# Patient Record
Sex: Female | Born: 1960 | Race: White | Hispanic: No | Smoking: Current some day smoker
Health system: Southern US, Community
[De-identification: ages and names within clinical notes are randomized; demographics above are authoritative.]

## PROBLEM LIST (undated history)

## (undated) DIAGNOSIS — G43909 Migraine, unspecified, not intractable, without status migrainosus: Secondary | ICD-10-CM

## (undated) DIAGNOSIS — M792 Neuralgia and neuritis, unspecified: Secondary | ICD-10-CM

## (undated) DIAGNOSIS — F419 Anxiety disorder, unspecified: Secondary | ICD-10-CM

## (undated) DIAGNOSIS — E039 Hypothyroidism, unspecified: Secondary | ICD-10-CM

## (undated) DIAGNOSIS — F329 Major depressive disorder, single episode, unspecified: Secondary | ICD-10-CM

## (undated) DIAGNOSIS — J449 Chronic obstructive pulmonary disease, unspecified: Secondary | ICD-10-CM

## (undated) DIAGNOSIS — M1990 Unspecified osteoarthritis, unspecified site: Secondary | ICD-10-CM

## (undated) DIAGNOSIS — R112 Nausea with vomiting, unspecified: Secondary | ICD-10-CM

## (undated) DIAGNOSIS — M4800 Spinal stenosis, site unspecified: Secondary | ICD-10-CM

## (undated) DIAGNOSIS — R0600 Dyspnea, unspecified: Secondary | ICD-10-CM

## (undated) DIAGNOSIS — Z87442 Personal history of urinary calculi: Secondary | ICD-10-CM

## (undated) DIAGNOSIS — J189 Pneumonia, unspecified organism: Secondary | ICD-10-CM

## (undated) DIAGNOSIS — E785 Hyperlipidemia, unspecified: Secondary | ICD-10-CM

## (undated) DIAGNOSIS — K5900 Constipation, unspecified: Secondary | ICD-10-CM

## (undated) DIAGNOSIS — F32A Depression, unspecified: Secondary | ICD-10-CM

## (undated) DIAGNOSIS — Z9289 Personal history of other medical treatment: Secondary | ICD-10-CM

## (undated) DIAGNOSIS — J45909 Unspecified asthma, uncomplicated: Secondary | ICD-10-CM

## (undated) DIAGNOSIS — Z9889 Other specified postprocedural states: Secondary | ICD-10-CM

## (undated) HISTORY — DX: Major depressive disorder, single episode, unspecified: F32.9

## (undated) HISTORY — PX: CHOLECYSTECTOMY: SHX55

## (undated) HISTORY — PX: APPENDECTOMY: SHX54

## (undated) HISTORY — DX: Depression, unspecified: F32.A

## (undated) HISTORY — PX: ABDOMINAL HYSTERECTOMY: SHX81

## (undated) HISTORY — DX: Migraine, unspecified, not intractable, without status migrainosus: G43.909

## (undated) HISTORY — DX: Hyperlipidemia, unspecified: E78.5

## (undated) HISTORY — PX: COLONOSCOPY: SHX174

---

## 1999-08-15 ENCOUNTER — Encounter: Admission: RE | Admit: 1999-08-15 | Discharge: 1999-11-13 | Payer: Self-pay | Admitting: Occupational Medicine

## 1999-10-05 ENCOUNTER — Other Ambulatory Visit: Admission: RE | Admit: 1999-10-05 | Discharge: 1999-10-05 | Payer: Self-pay | Admitting: Family Medicine

## 2001-07-04 LAB — HM COLONOSCOPY

## 2002-01-28 ENCOUNTER — Encounter: Admission: RE | Admit: 2002-01-28 | Discharge: 2002-01-28 | Payer: Self-pay | Admitting: Occupational Medicine

## 2002-01-28 ENCOUNTER — Encounter: Payer: Self-pay | Admitting: Occupational Medicine

## 2006-10-16 ENCOUNTER — Emergency Department (HOSPITAL_COMMUNITY): Admission: EM | Admit: 2006-10-16 | Discharge: 2006-10-16 | Payer: Self-pay | Admitting: Emergency Medicine

## 2007-04-17 ENCOUNTER — Emergency Department (HOSPITAL_COMMUNITY): Admission: EM | Admit: 2007-04-17 | Discharge: 2007-04-17 | Payer: Self-pay | Admitting: Emergency Medicine

## 2007-10-25 ENCOUNTER — Ambulatory Visit: Payer: Self-pay | Admitting: Specialist

## 2010-11-18 LAB — POCT I-STAT CREATININE
Creatinine, Ser: 1.4 — ABNORMAL HIGH
Operator id: 282201

## 2010-11-18 LAB — CBC
HCT: 42.5
Hemoglobin: 14.6
MCHC: 34.5
MCV: 94.4
Platelets: 276
RBC: 4.5
RDW: 14
WBC: 10.2

## 2010-11-18 LAB — POCT CARDIAC MARKERS
CKMB, poc: <1 — ABNORMAL LOW
CKMB, poc: <1 — ABNORMAL LOW
Myoglobin, poc: 280
Myoglobin, poc: 325
Operator id: 282201
Operator id: 288331
Troponin i, poc: <0.05

## 2010-11-18 LAB — DIFFERENTIAL
Basophils Absolute: 0.1
Basophils Relative: 1
Eosinophils Absolute: 0.3
Eosinophils Relative: 3
Lymphocytes Relative: 44
Lymphs Abs: 4.5 — ABNORMAL HIGH
Monocytes Absolute: 0.5
Monocytes Relative: 5
Neutro Abs: 4.8
Neutrophils Relative %: 47

## 2010-11-18 LAB — I-STAT 8, (EC8 V) (CONVERTED LAB)
Acid-base deficit: 1
Chloride: 108
Hemoglobin: 15.3 — ABNORMAL HIGH
Potassium: 3.6
Sodium: 137
TCO2: 23

## 2010-12-09 LAB — DIFFERENTIAL
Basophils Absolute: 0.1
Basophils Relative: 0
Eosinophils Absolute: 0.2
Monocytes Relative: 5
Neutrophils Relative %: 70

## 2010-12-09 LAB — I-STAT 8, (EC8 V) (CONVERTED LAB)
Acid-base deficit: 1
Bicarbonate: 24.7 — ABNORMAL HIGH
Chloride: 108
pCO2, Ven: 42.6 — ABNORMAL LOW
pH, Ven: 7.372 — ABNORMAL HIGH

## 2010-12-09 LAB — CBC
MCHC: 35.5
MCV: 91.7
Platelets: 298
RDW: 13.4

## 2010-12-09 LAB — POCT I-STAT CREATININE
Creatinine, Ser: 1.2
Operator id: 285841

## 2010-12-09 LAB — POCT CARDIAC MARKERS
Operator id: 285841
Troponin i, poc: <0.05

## 2011-04-03 ENCOUNTER — Emergency Department (HOSPITAL_BASED_OUTPATIENT_CLINIC_OR_DEPARTMENT_OTHER)
Admission: EM | Admit: 2011-04-03 | Discharge: 2011-04-03 | Disposition: A | Discharge status: Home / Self Care | Payer: Managed Care, Other (non HMO) | Attending: Emergency Medicine | Admitting: Emergency Medicine

## 2011-04-03 ENCOUNTER — Encounter (HOSPITAL_BASED_OUTPATIENT_CLINIC_OR_DEPARTMENT_OTHER): Payer: Self-pay | Admitting: Student

## 2011-04-03 ENCOUNTER — Emergency Department (INDEPENDENT_AMBULATORY_CARE_PROVIDER_SITE_OTHER): Payer: Managed Care, Other (non HMO)

## 2011-04-03 ENCOUNTER — Other Ambulatory Visit: Payer: Self-pay

## 2011-04-03 DIAGNOSIS — R079 Chest pain, unspecified: Secondary | ICD-10-CM

## 2011-04-03 DIAGNOSIS — M549 Dorsalgia, unspecified: Secondary | ICD-10-CM

## 2011-04-03 DIAGNOSIS — R42 Dizziness and giddiness: Secondary | ICD-10-CM

## 2011-04-03 DIAGNOSIS — R0602 Shortness of breath: Secondary | ICD-10-CM | POA: Insufficient documentation

## 2011-04-03 DIAGNOSIS — Z79899 Other long term (current) drug therapy: Secondary | ICD-10-CM | POA: Insufficient documentation

## 2011-04-03 DIAGNOSIS — F411 Generalized anxiety disorder: Secondary | ICD-10-CM | POA: Insufficient documentation

## 2011-04-03 DIAGNOSIS — E039 Hypothyroidism, unspecified: Secondary | ICD-10-CM | POA: Insufficient documentation

## 2011-04-03 DIAGNOSIS — J4489 Other specified chronic obstructive pulmonary disease: Secondary | ICD-10-CM | POA: Insufficient documentation

## 2011-04-03 DIAGNOSIS — M79609 Pain in unspecified limb: Secondary | ICD-10-CM | POA: Insufficient documentation

## 2011-04-03 DIAGNOSIS — I700 Atherosclerosis of aorta: Secondary | ICD-10-CM

## 2011-04-03 DIAGNOSIS — R55 Syncope and collapse: Secondary | ICD-10-CM

## 2011-04-03 DIAGNOSIS — R110 Nausea: Secondary | ICD-10-CM | POA: Insufficient documentation

## 2011-04-03 DIAGNOSIS — R059 Cough, unspecified: Secondary | ICD-10-CM | POA: Insufficient documentation

## 2011-04-03 DIAGNOSIS — R05 Cough: Secondary | ICD-10-CM | POA: Insufficient documentation

## 2011-04-03 DIAGNOSIS — I708 Atherosclerosis of other arteries: Secondary | ICD-10-CM

## 2011-04-03 DIAGNOSIS — J449 Chronic obstructive pulmonary disease, unspecified: Secondary | ICD-10-CM | POA: Insufficient documentation

## 2011-04-03 DIAGNOSIS — F17200 Nicotine dependence, unspecified, uncomplicated: Secondary | ICD-10-CM | POA: Insufficient documentation

## 2011-04-03 HISTORY — DX: Neuralgia and neuritis, unspecified: M79.2

## 2011-04-03 HISTORY — DX: Chronic obstructive pulmonary disease, unspecified: J44.9

## 2011-04-03 HISTORY — DX: Hypothyroidism, unspecified: E03.9

## 2011-04-03 HISTORY — DX: Anxiety disorder, unspecified: F41.9

## 2011-04-03 LAB — BASIC METABOLIC PANEL
CO2: 25 mEq/L (ref 19–32)
Chloride: 109 mEq/L (ref 96–112)
Sodium: 144 mEq/L (ref 135–145)

## 2011-04-03 LAB — DIFFERENTIAL
Basophils Absolute: 0 10*3/uL (ref 0.0–0.1)
Lymphocytes Relative: 35 % (ref 12–46)
Monocytes Absolute: 0.4 10*3/uL (ref 0.1–1.0)
Neutro Abs: 4.5 10*3/uL (ref 1.7–7.7)

## 2011-04-03 LAB — CARDIAC PANEL(CRET KIN+CKTOT+MB+TROPI)
Relative Index: 1 (ref 0.0–2.5)
Total CK: 179 U/L — ABNORMAL HIGH (ref 7–177)
Troponin I: <0.3 ng/mL (ref <0.30)

## 2011-04-03 LAB — CBC
HCT: 46.5 % — ABNORMAL HIGH (ref 36.0–46.0)
Platelets: 209 10*3/uL (ref 150–400)
RDW: 13.7 % (ref 11.5–15.5)
WBC: 8 10*3/uL (ref 4.0–10.5)

## 2011-04-03 LAB — LIPASE, BLOOD: Lipase: 31 U/L (ref 11–59)

## 2011-04-03 MED ORDER — MORPHINE SULFATE 4 MG/ML IJ SOLN
4.0000 mg | Freq: Once | INTRAMUSCULAR | Status: AC
  Administered 2011-04-03: 4 mg via INTRAVENOUS
  Filled 2011-04-03: qty 1

## 2011-04-03 MED ORDER — IOHEXOL 350 MG/ML SOLN
80.0000 mL | Freq: Once | INTRAVENOUS | Status: AC | PRN
  Administered 2011-04-03: 80 mL via INTRAVENOUS

## 2011-04-03 MED ORDER — DIPHENHYDRAMINE HCL 50 MG/ML IJ SOLN
INTRAMUSCULAR | Status: AC
  Administered 2011-04-03: 25 mg via INTRAVENOUS
  Filled 2011-04-03: qty 1

## 2011-04-03 MED ORDER — SODIUM CHLORIDE 0.9 % IV BOLUS (SEPSIS)
1000.0000 mL | Freq: Once | INTRAVENOUS | Status: AC
  Administered 2011-04-03: 1000 mL via INTRAVENOUS

## 2011-04-03 MED ORDER — DIPHENHYDRAMINE HCL 50 MG/ML IJ SOLN
25.0000 mg | Freq: Once | INTRAMUSCULAR | Status: AC
  Administered 2011-04-03: 25 mg via INTRAVENOUS

## 2011-04-03 MED ORDER — ONDANSETRON HCL 4 MG/2ML IJ SOLN
4.0000 mg | Freq: Once | INTRAMUSCULAR | Status: AC
  Administered 2011-04-03: 4 mg via INTRAVENOUS
  Filled 2011-04-03: qty 2

## 2011-04-03 NOTE — ED Notes (Signed)
Allergic reaction noted to morphine, md notified, orders received and carried

## 2011-04-03 NOTE — ED Notes (Signed)
Pt in via EMS with c/o near syncopal episode at work

## 2011-04-03 NOTE — ED Notes (Signed)
Pt in via EMS from with c/o sudden onset of extreme pain between shoulders radiating to right arm last several minutes with associated N and dizziness after getting oob this morning. Reports it resolved and went to work where near syncopal episode happened again. With same s/sx. Reports getting lightheaded and dizzy with near syncope onset. Per ems 12 lead ekg NSR, pt placed on monitors and EKG performed upon arrival. Denies V D CP LOC SOB

## 2011-04-03 NOTE — ED Provider Notes (Signed)
History     CSN: [id number]  Arrival date & time 04/03/11  1308   First MD Initiated Contact with Patient 04/03/11 0815      Chief Complaint  Patient presents with  . Near Syncope    (Consider location/radiation/quality/duration/timing/severity/associated sxs/prior treatment) HPI  Woke up this morning with pain btw scapula. Also with dizziness which she describes as room spinning. Pain was sharp in back 10/10 with radiation to right arm. C/O transient sharp pain anterior chest pain as well. C/O more shortness of breath than usual. States she was able to make it to work then suddenly acute onset of vertigo and worsening shoulder pain. +nausea,no vomiting. Felt chills and clammy at that time. Each episode lasts a few minutes. Also had one at night. No syncope. CO "chest congestion" and coughing x one month. Denies h/o VTE in self or family. No recent hosp/surg/immob. No h/o cancer. Denies exogenous hormone use, no leg pain or swelling. Denies numbness/tingling/weakness of extremities. No trauma.   ED Notes, ED Provider Notes from 04/03/11 0000 to 04/03/11 08:21:25       Liliane Shi, RN 04/03/2011 08:18      Pt in via EMS from with c/o sudden onset of extreme pain between shoulders radiating to right arm last several minutes with associated N and dizziness after getting oob this morning. Reports it resolved and went to work where near syncopal episode happened again. With same s/sx. Reports getting lightheaded and dizzy with near syncope onset. Per ems 12 lead ekg NSR, pt placed on monitors and EKG performed upon arrival. Denies V D CP LOC SOB         Liliane Shi, RN 04/03/2011 08:01      Pt in via EMS with c/o near syncopal episode at work     Past Medical History  Diagnosis Date  . COPD (chronic obstructive pulmonary disease)   . Anxiety   . Neuropathic pain   . Hypothyroidism     Past Surgical History  Procedure Date  . Abdominal hysterectomy     No family history  on file.  History  Substance Use Topics  . Smoking status: Current Everyday Smoker  . Smokeless tobacco: Not on file  . Alcohol Use: Yes    OB History    Grav Para Term Preterm Abortions TAB SAB Ect Mult Living                  Review of Systems  All other systems reviewed and are negative.   except as noted HPI   Allergies  Codeine  Home Medications   Current Outpatient Rx  Name Route Sig Dispense Refill  . ALBUTEROL SULFATE HFA 108 (90 BASE) MCG/ACT IN AERS Inhalation Inhale 2 puffs into the lungs every 6 (six) hours as needed. For wheezing    . ASPIRIN-SALICYLAMIDE-CAFFEINE 325-95-16 MG PO TABS Oral Take 1 packet by mouth 3 (three) times daily as needed. For headache    . DIAZEPAM 2 MG PO TABS Oral Take 2 mg by mouth every 6 (six) hours as needed. For anxiety    . GABAPENTIN 100 MG PO CAPS Oral Take 300 mg by mouth daily. Takes 100 mg in morning and 200 mg at bedtime    . IBUPROFEN 200 MG PO TABS Oral Take 400 mg by mouth every 6 (six) hours as needed. For joint pain    . LEVOTHYROXINE SODIUM 125 MCG PO TABS Oral Take 125 mcg by mouth daily.    Marland Kitchen  PHENYLEPHRINE-DM-GG-APAP 5-10-200-325 MG/10ML PO LIQD Oral Take 10 mLs by mouth daily as needed. For  Congestion and cough      BP 102/62  Pulse 64  Temp(Src) 97.6 F (36.4 C) (Oral)  Resp 20  Wt 133 lb (60.328 kg)  SpO2 94%  Physical Exam  Nursing note and vitals reviewed. Constitutional: She is oriented to person, place, and time. She appears well-developed.  HENT:  Head: Atraumatic.  Mouth/Throat: Oropharynx is clear and moist.  Eyes: Conjunctivae and EOM are normal. Pupils are equal, round, and reactive to light.  Neck: Normal range of motion. Neck supple.  Cardiovascular: Normal rate, regular rhythm, normal heart sounds and intact distal pulses.   Pulmonary/Chest: Effort normal and breath sounds normal. No respiratory distress. She has no wheezes. She has no rales.  Abdominal: Soft. She exhibits no distension.  There is no tenderness. There is no rebound and no guarding.  Musculoskeletal: Normal range of motion. She exhibits no edema and no tenderness.       No thoracic spine ttp  Neurological: She is alert and oriented to person, place, and time. No cranial nerve deficit. She exhibits normal muscle tone. Coordination normal.       Strength 5/5 all extremities No pronator drift No facial droop   Skin: Skin is warm and dry. No rash noted.  Psychiatric: She has a normal mood and affect.     Date: 04/03/2011  Rate: 64  Rhythm: normal sinus rhythm  QRS Axis: normal  Intervals: normal  ST/T Wave abnormalities: normal  Conduction Disutrbances:none  Narrative Interpretation:   Old EKG Reviewed: none available  ED Course  Procedures (including critical care time)  Labs Reviewed  CBC - Abnormal; Notable for the following:    Hemoglobin 16.0 (*)    HCT 46.5 (*)    All other components within normal limits  BASIC METABOLIC PANEL - Abnormal; Notable for the following:    GFR calc non Af Amer 85 (*)    All other components within normal limits  CARDIAC PANEL(CRET KIN+CKTOT+MB+TROPI) - Abnormal; Notable for the following:    Total CK 179 (*)    All other components within normal limits  DIFFERENTIAL  LIPASE, BLOOD  TROPONIN I   Dg Chest 2 View  04/03/2011  *RADIOLOGY REPORT*  Clinical Data: Chest pain, dizziness, syncope, nausea.  History of COPD.  CHEST - 2 VIEW  Comparison: 04/17/2007.  Findings: There are changes of COPD present with linear bibasilar scarring changes and hyperinflation.  There are no acute infiltrates and there are no edematous changes.  The heart and mediastinal structures have a normal appearance.  IMPRESSION: Changes of COPD.  No acute findings.  Original Report Authenticated By: Rolla Plate, M.D.   Ct Angio Chest W/cm &/or Wo Cm  04/03/2011  *RADIOLOGY REPORT*  Clinical Data:  Back pain, chest pain and syncope.  CT ANGIOGRAPHY CHEST, ABDOMEN AND PELVIS  Technique:   Multidetector CT imaging through the chest, abdomen and pelvis was performed using the standard protocol during bolus administration of intravenous contrast.  Multiplanar reconstructed images including MIPs were obtained and reviewed to evaluate the vascular anatomy.  Contrast: 80mL OMNIPAQUE IOHEXOL 350 MG/ML IV SOLN,  Comparison:  None  CTA CHEST  Findings:  The chest wall is unremarkable.  No obvious breast masses, supraclavicular or axillary lymphadenopathy.  Small scattered lymph nodes are noted.  The bony thorax is intact.  The heart is normal in size.  No pericardial effusion.  No mediastinal or hilar lymphadenopathy.  Small scattered nodes are noted.  The esophagus is grossly normal.  The thoracic aorta is normal in caliber.  No dissection.  The pulmonary arterial tree is well opacified.  No filling defects to suggest pulmonary emboli.  Examination of the lung parenchyma demonstrates significant emphysematous changes.  No acute pulmonary findings.  No pleural effusion or pulmonary edema.   Review of the MIP images confirms the above findings.  IMPRESSION:  1.  No CT findings for aortic dissection. 2.  Emphysematous changes but no acute pulmonary findings.  CTA ABDOMEN AND PELVIS  Findings:  The abdominal aorta is normal in caliber.  No dissection.  The branch vessels are normal.  Minimal atherosclerotic calcifications in the distal aorta.  The iliac arteries are normal in caliber.  Minimal scattered atherosclerotic calcifications.  No dissection.  No occlusion.  The solid abdominal organs are unremarkable.  No mass lesions.  The gallbladder is normal.  No common bile duct dilatation.  The stomach, duodenum, small bowel and colon are grossly normal without oral contrast.  The bladder is normal.  The uterus is surgically absent.  No pelvic mass, adenopathy or free pelvic fluid collections.  No inguinal mass or hernia.  The bony structures are unremarkable.   Review of the MIP images confirms the above  findings.  IMPRESSION:  1.  Minimal atherosclerotic calcifications involving the aorta and iliac arteries but no aneurysm or dissection. 2.  No acute abdominal/pelvic findings, mass lesions or adenopathy.  Original Report Authenticated By: P. Loralie Champagne, M.D.   Ct Angio Abd/pel W/ And/or W/o  04/03/2011  *RADIOLOGY REPORT*  Clinical Data:  Back pain, chest pain and syncope.  CT ANGIOGRAPHY CHEST, ABDOMEN AND PELVIS  Technique:  Multidetector CT imaging through the chest, abdomen and pelvis was performed using the standard protocol during bolus administration of intravenous contrast.  Multiplanar reconstructed images including MIPs were obtained and reviewed to evaluate the vascular anatomy.  Contrast: 80mL OMNIPAQUE IOHEXOL 350 MG/ML IV SOLN,  Comparison:  None  CTA CHEST  Findings:  The chest wall is unremarkable.  No obvious breast masses, supraclavicular or axillary lymphadenopathy.  Small scattered lymph nodes are noted.  The bony thorax is intact.  The heart is normal in size.  No pericardial effusion.  No mediastinal or hilar lymphadenopathy.  Small scattered nodes are noted.  The esophagus is grossly normal.  The thoracic aorta is normal in caliber.  No dissection.  The pulmonary arterial tree is well opacified.  No filling defects to suggest pulmonary emboli.  Examination of the lung parenchyma demonstrates significant emphysematous changes.  No acute pulmonary findings.  No pleural effusion or pulmonary edema.   Review of the MIP images confirms the above findings.  IMPRESSION:  1.  No CT findings for aortic dissection. 2.  Emphysematous changes but no acute pulmonary findings.  CTA ABDOMEN AND PELVIS  Findings:  The abdominal aorta is normal in caliber.  No dissection.  The branch vessels are normal.  Minimal atherosclerotic calcifications in the distal aorta.  The iliac arteries are normal in caliber.  Minimal scattered atherosclerotic calcifications.  No dissection.  No occlusion.  The solid  abdominal organs are unremarkable.  No mass lesions.  The gallbladder is normal.  No common bile duct dilatation.  The stomach, duodenum, small bowel and colon are grossly normal without oral contrast.  The bladder is normal.  The uterus is surgically absent.  No pelvic mass, adenopathy or free pelvic fluid collections.  No inguinal mass or hernia.  The bony structures are unremarkable.   Review of the MIP images confirms the above findings.  IMPRESSION:  1.  Minimal atherosclerotic calcifications involving the aorta and iliac arteries but no aneurysm or dissection. 2.  No acute abdominal/pelvic findings, mass lesions or adenopathy.  Original Report Authenticated By: P. Loralie Champagne, M.D.     1. Back pain   2. Chest pain   3. Near syncope      MDM  Presents with chest/back pain, dizziness, near syncope.  CXR eval ptx, CTA chest eval dissection. Low susp PE, ACS. I do not suspect acute thoracic spine etiology. IVF, pain control, zofran. Reassess.  4098 Patient c/o itching at site of morphine admin. Benadryl ordered.  1200. Patient asx at this time. Labs and CTA revealed and unremarkable. Repeat CE 1P. Patient and family updated on plan.  2:18 PM Second tropnin neg. Patient continues to remain asx. If ambulatory and asx, will discharge home with PMD f/u.  PMD [facility]  Forbes Cellar, MD 04/03/11 [phone]

## 2014-12-01 LAB — HM COLONOSCOPY

## 2015-12-07 ENCOUNTER — Ambulatory Visit (INDEPENDENT_AMBULATORY_CARE_PROVIDER_SITE_OTHER): Payer: Managed Care, Other (non HMO) | Admitting: Family Medicine

## 2015-12-07 ENCOUNTER — Encounter: Payer: Self-pay | Admitting: Family Medicine

## 2015-12-07 VITALS — BP 126/86 | HR 85 | Temp 98.4°F | Ht 64.5 in | Wt 124.0 lb

## 2015-12-07 DIAGNOSIS — J441 Chronic obstructive pulmonary disease with (acute) exacerbation: Secondary | ICD-10-CM | POA: Diagnosis not present

## 2015-12-07 DIAGNOSIS — R252 Cramp and spasm: Secondary | ICD-10-CM

## 2015-12-07 DIAGNOSIS — J449 Chronic obstructive pulmonary disease, unspecified: Secondary | ICD-10-CM | POA: Insufficient documentation

## 2015-12-07 DIAGNOSIS — J410 Simple chronic bronchitis: Secondary | ICD-10-CM

## 2015-12-07 DIAGNOSIS — E038 Other specified hypothyroidism: Secondary | ICD-10-CM | POA: Diagnosis not present

## 2015-12-07 DIAGNOSIS — F3289 Other specified depressive episodes: Secondary | ICD-10-CM

## 2015-12-07 DIAGNOSIS — Z23 Encounter for immunization: Secondary | ICD-10-CM | POA: Diagnosis not present

## 2015-12-07 DIAGNOSIS — D508 Other iron deficiency anemias: Secondary | ICD-10-CM

## 2015-12-07 DIAGNOSIS — F419 Anxiety disorder, unspecified: Secondary | ICD-10-CM | POA: Insufficient documentation

## 2015-12-07 DIAGNOSIS — D649 Anemia, unspecified: Secondary | ICD-10-CM | POA: Insufficient documentation

## 2015-12-07 DIAGNOSIS — R062 Wheezing: Secondary | ICD-10-CM

## 2015-12-07 DIAGNOSIS — F339 Major depressive disorder, recurrent, unspecified: Secondary | ICD-10-CM | POA: Insufficient documentation

## 2015-12-07 DIAGNOSIS — R5383 Other fatigue: Secondary | ICD-10-CM | POA: Insufficient documentation

## 2015-12-07 DIAGNOSIS — G43909 Migraine, unspecified, not intractable, without status migrainosus: Secondary | ICD-10-CM | POA: Insufficient documentation

## 2015-12-07 DIAGNOSIS — E039 Hypothyroidism, unspecified: Secondary | ICD-10-CM | POA: Insufficient documentation

## 2015-12-07 DIAGNOSIS — G43001 Migraine without aura, not intractable, with status migrainosus: Secondary | ICD-10-CM

## 2015-12-07 DIAGNOSIS — F329 Major depressive disorder, single episode, unspecified: Secondary | ICD-10-CM | POA: Insufficient documentation

## 2015-12-07 MED ORDER — ALBUTEROL SULFATE HFA 108 (90 BASE) MCG/ACT IN AERS: 2.0000 | INHALATION_SPRAY | Freq: Four times a day (QID) | RESPIRATORY_TRACT | 1 refills | Status: DC | PRN

## 2015-12-07 MED ORDER — PREDNISONE 20 MG PO TABS: ORAL_TABLET | ORAL | 0 refills | Status: DC

## 2015-12-07 MED ORDER — AMOXICILLIN-POT CLAVULANATE 875-125 MG PO TABS: 1.0000 | ORAL_TABLET | Freq: Two times a day (BID) | ORAL | 0 refills | Status: AC

## 2015-12-07 MED ORDER — HYDROCOD POLST-CPM POLST ER 10-8 MG/5ML PO SUER
5.0000 mL | Freq: Two times a day (BID) | ORAL | 0 refills | Status: DC | PRN
Start: 2015-12-07 — End: 2016-01-03

## 2015-12-07 NOTE — Progress Notes (Signed)
New patient office visit note:  Impression and Recommendations:    1. COPD exacerbation ([place])   2. Need for prophylactic vaccination and inoculation against influenza   3. Migraine without aura and with status migrainosus, not intractable   4. Other specified hypothyroidism   5. Simple chronic bronchitis ([place])   6. Anxiety   7. Other depression   8. Other fatigue   9. Other iron deficiency anemia   10. Wheeze   11. Bilateral leg cramps    - Counseled patient on pathophysiology of disease and discussed various treatment options, which often includes dietary and lifestyle modifications as first line, in addition to discussing the risks and benefits of various medications.  Antibiotics, cough medicines, prednisone given.  - Anticipatory guidance given.   - Encouraged to return to clinic or call the office with any further questions or concerns.  - Fasting labs for near future to get baseline then follow-up office visit to discuss these things.   Declines any need for further refills.  Orders Placed This Encounter  Procedures  . COMPLETE METABOLIC PANEL WITH GFR  . CBC with Differential/Platelet  . Hemoglobin A1c  . Hepatitis C antibody  . HIV antibody  . Lipid panel  . Magnesium  . Phosphorus  . T4, free  . TSH  . VITAMIN D 25 Hydroxy (Vit-D Deficiency, Fractures)  . Vitamin B12    New Prescriptions   AMOXICILLIN-CLAVULANATE (AUGMENTIN) 875-125 MG TABLET    Take 1 tablet by mouth 2 (two) times daily.   CHLORPHENIRAMINE-HYDROCODONE (TUSSIONEX) 10-8 MG/5ML SUER    Take 5 mLs by mouth every 12 (twelve) hours as needed for cough (cough, will cause drowsiness.).   PREDNISONE (DELTASONE) 20 MG TABLET    Take 3 pills a day for 2 days, 2 pills a day for 2 days, 1 pill a day for 2 days then one half pill a day for 2 days then off    Modified Medications   Modified Medication Previous Medication   ALBUTEROL (PROVENTIL HFA;VENTOLIN HFA) 108 (90 BASE) MCG/ACT INHALER  albuterol (PROVENTIL HFA;VENTOLIN HFA) 108 (90 BASE) MCG/ACT inhaler      Inhale 2 puffs into the lungs every 6 (six) hours as needed. For wheezing    Inhale 2 puffs into the lungs every 6 (six) hours as needed. For wheezing    Discontinued Medications   DIAZEPAM (VALIUM) 2 MG TABLET    Take 2 mg by mouth every 6 (six) hours as needed. For anxiety   GABAPENTIN (NEURONTIN) 100 MG CAPSULE    Take 300 mg by mouth daily. Takes 100 mg in morning and 200 mg at bedtime   LEVOTHYROXINE (SYNTHROID, LEVOTHROID) 125 MCG TABLET    Take 125 mcg by mouth daily.   PHENYLEPHRINE-DM-GG-APAP (MUCINEX CHILD MULTI-SYMPTOM) 5-10-200-325 MG/10ML LIQD    Take 10 mLs by mouth daily as needed. For  Congestion and cough    Return for FBW near future and then OV with me 1 wk later to discuss.  The patient was counseled, risk factors were discussed, anticipatory guidance given.  Gross side effects, risk and benefits, and alternatives of medications discussed with patient.  Patient is aware that all medications have potential side effects and we are unable to predict every side effect or drug-drug interaction that may occur.  Expresses verbal understanding and consents to current therapy plan and treatment regimen.  Please see AVS handed out to patient at the end of our visit for further patient  instructions/ counseling done pertaining to today's office visit.    Note: This document was prepared using Dragon voice recognition software and may include unintentional dictation errors.  ----------------------------------------------------------------------------------------------------------------------    Subjective:    Chief Complaint  Patient presents with  . Establish Care    HPI: Kellie Jones is a pleasant 55 y.o. female who presents to [place] [facility] today to review their medical history with me and establish care.   I asked the patient to review their chronic problem list with me  to ensure everything was updated and accurate.     Feet cramps- random times during the day, 15 yrs. + claudication Symptoms  . In legs = 5-10 yrs and feet.   -  43yrs smoking- 1 or less PPD.  Still smoking. Tried quit many times- chantix- bad s-e, patches- didn't work and caused rashes.    - History of depression on Zoloft, stable  - History of migraines, uses Imitrex when necessary.  CC: Prod cough, nasal  congestion, RN- clear, F/C--> 101.1 , Hard to breath- wheezing - tightness in chest- 3 wks now and rece random sugar here as a new patient so ntly Went to [employer] [facility]- got a steroid shot and that's all.   Patient Care Team    Relationship Specialty Notifications Start End  Mellody Dance, DO PCP - General Family Medicine  12/07/15   Gardiner Rhyme, MD Referring Physician Specialist  12/08/15     Wt Readings from Last 3 Encounters:  12/07/15 124 lb (56.2 kg)  04/03/11 133 lb (60.3 kg)   BP Readings from Last 3 Encounters:  12/07/15 126/86  04/03/11 102/62   Pulse Readings from Last 3 Encounters:  12/07/15 85  04/03/11 64   BMI Readings from Last 3 Encounters:  12/07/15 20.96 kg/m   No results found for: HGBA1C  Patient Active Problem List   Diagnosis Date Noted  . Bilateral leg cramps 12/11/2015  . Migraine 12/07/2015  . Hypothyroidism 12/07/2015  . COPD (chronic obstructive pulmonary disease) ([place]) 12/07/2015  . Anxiety 12/07/2015  . Depression 12/07/2015  . Other fatigue 12/07/2015  . Absolute anemia 12/07/2015     Past Medical History:  Diagnosis Date  . Anxiety   . COPD (chronic obstructive pulmonary disease) ([place])   . Depression   . Hypothyroidism   . Migraine   . Neuropathic pain      Past Surgical History:  Procedure Laterality Date  . ABDOMINAL HYSTERECTOMY     total  . APPENDECTOMY    . CHOLECYSTECTOMY       Family History  Problem Relation Age of Onset  . Depression Mother   . Cancer Father 46    pancreatic  . Healthy  Sister   . Healthy Brother   . Cancer Maternal Aunt 56    breast  . Cancer Maternal Grandmother 39    breast  . Healthy Brother   . Healthy Brother      History  Drug Use No    History  Alcohol Use  . Yes    Comment: rarely    History  Smoking Status  . Current Every Day Smoker  . Packs/day: 0.50  . Years: 27.00  Smokeless Tobacco  . Never Used    Patient's Medications  New Prescriptions   AMOXICILLIN-CLAVULANATE (AUGMENTIN) 875-125 MG TABLET    Take 1 tablet by mouth 2 (two) times daily.   CHLORPHENIRAMINE-HYDROCODONE (TUSSIONEX) 10-8 MG/5ML SUER    Take 5 mLs  by mouth every 12 (twelve) hours as needed for cough (cough, will cause drowsiness.).   PREDNISONE (DELTASONE) 20 MG TABLET    Take 3 pills a day for 2 days, 2 pills a day for 2 days, 1 pill a day for 2 days then one half pill a day for 2 days then off  Previous Medications   ASPIRIN-SALICYLAMIDE-CAFFEINE (BC HEADACHE) 325-95-16 MG TABS    Take 1 packet by mouth 3 (three) times daily as needed. For headache   IBUPROFEN (ADVIL,MOTRIN) 200 MG TABLET    Take 400 mg by mouth every 6 (six) hours as needed. For joint pain   LEVOTHYROXINE (SYNTHROID, LEVOTHROID) 150 MCG TABLET    Take 150 mcg by mouth daily before breakfast.   METAXALONE (SKELAXIN) 800 MG TABLET    Take 800 mg by mouth. One tablet 3-4 times daily as needed   SERTRALINE (ZOLOFT) 100 MG TABLET    Take 100 mg by mouth at bedtime.   SUMATRIPTAN (IMITREX) 100 MG TABLET    Take 100 mg by mouth as needed.  Modified Medications   Modified Medication Previous Medication   ALBUTEROL (PROVENTIL HFA;VENTOLIN HFA) 108 (90 BASE) MCG/ACT INHALER albuterol (PROVENTIL HFA;VENTOLIN HFA) 108 (90 BASE) MCG/ACT inhaler      Inhale 2 puffs into the lungs every 6 (six) hours as needed. For wheezing    Inhale 2 puffs into the lungs every 6 (six) hours as needed. For wheezing  Discontinued Medications   DIAZEPAM (VALIUM) 2 MG TABLET    Take 2 mg by mouth every 6 (six) hours as  needed. For anxiety   GABAPENTIN (NEURONTIN) 100 MG CAPSULE    Take 300 mg by mouth daily. Takes 100 mg in morning and 200 mg at bedtime   LEVOTHYROXINE (SYNTHROID, LEVOTHROID) 125 MCG TABLET    Take 125 mcg by mouth daily.   PHENYLEPHRINE-DM-GG-APAP (MUCINEX CHILD MULTI-SYMPTOM) 5-10-200-325 MG/10ML LIQD    Take 10 mLs by mouth daily as needed. For  Congestion and cough    Allergies: Iodinated diagnostic agents and Codeine  Review of Systems  Constitutional: Positive for diaphoresis and fever. Negative for chills, malaise/fatigue and weight loss.  HENT: Positive for congestion. Negative for sore throat and tinnitus.   Eyes: Negative.  Negative for blurred vision, double vision and photophobia.  Respiratory: Positive for cough, sputum production, shortness of breath and wheezing.   Cardiovascular: Positive for palpitations. Negative for chest pain.  Gastrointestinal: Positive for abdominal pain, diarrhea, heartburn and nausea. Negative for blood in stool and vomiting.  Genitourinary: Negative.  Negative for dysuria, frequency and urgency.  Musculoskeletal: Positive for back pain, joint pain and neck pain. Negative for myalgias.  Skin: Negative.  Negative for itching and rash.  Neurological: Positive for tremors and weakness. Negative for dizziness, focal weakness and headaches.  Endo/Heme/Allergies: Positive for environmental allergies. Negative for polydipsia. Bruises/bleeds easily.  Psychiatric/Behavioral: Negative.  Negative for depression and memory loss. The patient is not nervous/anxious and does not have insomnia.      Objective:   Blood pressure 126/86, pulse 85, temperature 98.4 F (36.9 C), temperature source Oral, height 5' 4.5" (1.638 m), weight 124 lb (56.2 kg). Body mass index is 20.96 kg/m. General: Well Developed, well nourished, and in no acute distress.  Neuro: Alert and oriented x3, extra-ocular muscles intact, sensation grossly intact.  HEENT: Normocephalic,  atraumatic, pupils equal round reactive to light, neck supple, OP- mild erythema. Nares-erythematous and edematous, TMs-within normal limits bilaterally, lymphadenopathy present and nontender. Skin: no gross  suspicious lesions or rashes; No skin tenting.  Cardiac: Regular rate and rhythm, no murmurs rubs or gallops.  Respiratory: Wh and rhonchi b/l- diffuse. Not using accessory muscles, speaking in full sentences.  Abdominal: Soft, not grossly distended Musculoskeletal: Ambulates w/o diff, FROM * 4 ext. tenderness to palpation of gastrocnemius muscles.  FROM knee/ ankles Vasc: less 2 sec cap RF, warm and pink  Psych:  No HI/SI, judgement and insight good, Euthymic mood. Full Affect.

## 2015-12-07 NOTE — Patient Instructions (Signed)
Intermittent Claudication Intermittent claudication is pain in your leg that occurs when you walk or exercise and goes away when you rest. The pain can occur in one or both legs. CAUSES Intermittent claudication is caused by the buildup of plaque within the major arteries in the body (atherosclerosis). The plaque, which makes arteries stiff and narrow, prevents enough blood from reaching your leg muscles. The pain occurs when you walk or exercise because your muscles need more blood when you are moving and exercising. RISK FACTORS Risk factors include:  A family history of atherosclerosis.  A personal history of stroke or heart disease.  Older age.  Being inactive or overweight.  Smoking cigarettes.  Having another health condition such as:  Diabetes.  High blood pressure.  High cholesterol. SIGNS AND SYMPTOMS  Your hip or leg may:   Ache.  Cramp.  Feel tight.  Feel weak.  Feel heavy. Over time, you may feel pain in your calf, thigh, or hip. DIAGNOSIS  Your health care provider may diagnose intermittent claudication based on your symptoms and medical history. Your health care provider may also do tests to learn more about your condition. These may include:  Blood tests.  An ultrasound.  Imaging tests such as angiography, magnetic resonance angiography (MRA), and computed tomography angiography (CTA). TREATMENT You may be treated for problems such as:  High blood pressure.  High cholesterol.  Diabetes. Other treatments may include:  Lifestyle changes such as:  Starting an exercise program.  Losing weight.  Quitting smoking.  Medicines to help restore blood flow through your legs.  Blood vessel surgery (angioplasty) to restore blood flow if your intermittent claudication is caused by severe peripheral artery disease. HOME CARE INSTRUCTIONS  Manage any other health conditions you have.  Eat a diet low in saturated fats and calories to maintain a  healthy weight.  Quit smoking, if you smoke.  Take medicines only as directed by your health care provider.  If your health care provider recommended an exercise program for you, follow it as directed. Your exercise program may involve:  Walking three or more times a week.  Walking until you have certain symptoms of intermittent claudication.  Resting until symptoms go away.  Gradually increasing walking time to about 50 minutes a day. SEEK MEDICAL CARE IF: Your condition is not getting better or is getting worse. SEEK IMMEDIATE MEDICAL CARE IF:   You have chest pain.  You have difficulty breathing.  You develop arm weakness.  You have trouble speaking.  Your face begins to droop. MAKE SURE YOU:  Understand these instructions.  Will watch your condition.  Will get help if you are not doing well or get worse.   This information is not intended to replace advice given to you by your health care provider. Make sure you discuss any questions you have with your health care provider.   Document Released: 12/17/2003 Document Revised: 03/06/2014 Document Reviewed: 05/22/2013 Elsevier Interactive Patient Education 2016 Elsevier Inc.  

## 2015-12-11 DIAGNOSIS — R252 Cramp and spasm: Secondary | ICD-10-CM | POA: Insufficient documentation

## 2015-12-15 ENCOUNTER — Encounter: Payer: Self-pay | Admitting: Family Medicine

## 2015-12-15 ENCOUNTER — Other Ambulatory Visit (INDEPENDENT_AMBULATORY_CARE_PROVIDER_SITE_OTHER): Payer: Managed Care, Other (non HMO)

## 2015-12-15 DIAGNOSIS — Z23 Encounter for immunization: Secondary | ICD-10-CM | POA: Diagnosis not present

## 2015-12-15 DIAGNOSIS — F419 Anxiety disorder, unspecified: Secondary | ICD-10-CM

## 2015-12-15 DIAGNOSIS — E038 Other specified hypothyroidism: Secondary | ICD-10-CM

## 2015-12-15 DIAGNOSIS — D508 Other iron deficiency anemias: Secondary | ICD-10-CM

## 2015-12-15 DIAGNOSIS — G43001 Migraine without aura, not intractable, with status migrainosus: Secondary | ICD-10-CM

## 2015-12-15 DIAGNOSIS — F3289 Other specified depressive episodes: Secondary | ICD-10-CM

## 2015-12-15 DIAGNOSIS — J410 Simple chronic bronchitis: Secondary | ICD-10-CM

## 2015-12-15 DIAGNOSIS — R5383 Other fatigue: Secondary | ICD-10-CM

## 2015-12-16 LAB — CBC WITH DIFFERENTIAL/PLATELET
BASOS ABS: 96 {cells}/uL (ref 0–200)
Basophils Relative: 1 %
EOS ABS: 288 {cells}/uL (ref 15–500)
EOS PCT: 3 %
HEMATOCRIT: 49.1 % — AB (ref 35.0–45.0)
HEMOGLOBIN: 16.4 g/dL — AB (ref 11.7–15.5)
LYMPHS ABS: 3072 {cells}/uL (ref 850–3900)
Lymphocytes Relative: 32 %
MCH: 32 pg (ref 27.0–33.0)
MCHC: 33.4 g/dL (ref 32.0–36.0)
MCV: 95.9 fL (ref 80.0–100.0)
MPV: 9.1 fL (ref 7.5–12.5)
Monocytes Absolute: 864 cells/uL (ref 200–950)
Monocytes Relative: 9 %
NEUTROS ABS: 5280 {cells}/uL (ref 1500–7800)
NEUTROS PCT: 55 %
Platelets: 311 10*3/uL (ref 140–400)
RBC: 5.12 MIL/uL — ABNORMAL HIGH (ref 3.80–5.10)
RDW: 13.7 % (ref 11.0–15.0)
WBC: 9.6 10*3/uL (ref 3.8–10.8)

## 2015-12-16 LAB — COMPLETE METABOLIC PANEL WITH GFR
ALBUMIN: 4.4 g/dL (ref 3.6–5.1)
ALK PHOS: 59 U/L (ref 33–130)
ALT: 12 U/L (ref 6–29)
AST: 14 U/L (ref 10–35)
BUN: 12 mg/dL (ref 7–25)
CO2: 23 mmol/L (ref 20–31)
Calcium: 9.3 mg/dL (ref 8.6–10.4)
Chloride: 107 mmol/L (ref 98–110)
Creat: 0.74 mg/dL (ref 0.50–1.05)
GFR, Est African American: >89 mL/min (ref >=60)
GLUCOSE: 94 mg/dL (ref 65–99)
POTASSIUM: 4.6 mmol/L (ref 3.5–5.3)
SODIUM: 140 mmol/L (ref 135–146)
TOTAL PROTEIN: 6.6 g/dL (ref 6.1–8.1)
Total Bilirubin: 0.3 mg/dL (ref 0.2–1.2)

## 2015-12-16 LAB — HIV ANTIBODY (ROUTINE TESTING W REFLEX): HIV: NONREACTIVE

## 2015-12-16 LAB — HEMOGLOBIN A1C
Hgb A1c MFr Bld: 5 % (ref <5.7)
Mean Plasma Glucose: 97 mg/dL

## 2015-12-16 LAB — TSH: TSH: 1.3 mIU/L

## 2015-12-16 LAB — LIPID PANEL
CHOL/HDL RATIO: 2.8 ratio (ref <=5.0)
CHOLESTEROL: 181 mg/dL (ref 125–200)
HDL: 64 mg/dL (ref >=46)
LDL Cholesterol: 103 mg/dL (ref <130)
TRIGLYCERIDES: 72 mg/dL (ref <150)
VLDL: 14 mg/dL (ref <30)

## 2015-12-16 LAB — HEPATITIS C ANTIBODY: HCV Ab: NEGATIVE

## 2015-12-16 LAB — T4, FREE: Free T4: 1.2 ng/dL (ref 0.8–1.8)

## 2015-12-16 LAB — MAGNESIUM: Magnesium: 2 mg/dL (ref 1.5–2.5)

## 2015-12-16 LAB — PHOSPHORUS: PHOSPHORUS: 3 mg/dL (ref 2.5–4.5)

## 2015-12-16 LAB — VITAMIN B12: VITAMIN B 12: 353 pg/mL (ref 200–1100)

## 2015-12-16 LAB — VITAMIN D 25 HYDROXY (VIT D DEFICIENCY, FRACTURES): VIT D 25 HYDROXY: 14 ng/mL — AB (ref 30–100)

## 2015-12-21 ENCOUNTER — Other Ambulatory Visit: Payer: Self-pay

## 2015-12-21 MED ORDER — LEVOTHYROXINE SODIUM 150 MCG PO TABS: 150.0000 ug | ORAL_TABLET | Freq: Every day | ORAL | 0 refills | Status: DC

## 2015-12-21 MED ORDER — VITAMIN D (ERGOCALCIFEROL) 1.25 MG (50000 UNIT) PO CAPS: 50000.0000 [IU] | ORAL_CAPSULE | ORAL | 3 refills | Status: DC

## 2015-12-30 ENCOUNTER — Other Ambulatory Visit: Payer: Self-pay

## 2015-12-30 NOTE — Progress Notes (Signed)
ERROR

## 2016-01-03 ENCOUNTER — Encounter: Payer: Self-pay | Admitting: Family Medicine

## 2016-01-03 ENCOUNTER — Ambulatory Visit (INDEPENDENT_AMBULATORY_CARE_PROVIDER_SITE_OTHER): Payer: Managed Care, Other (non HMO) | Admitting: Family Medicine

## 2016-01-03 VITALS — BP 125/88 | HR 87 | Wt 124.2 lb

## 2016-01-03 DIAGNOSIS — J439 Emphysema, unspecified: Secondary | ICD-10-CM

## 2016-01-03 DIAGNOSIS — J3089 Other allergic rhinitis: Secondary | ICD-10-CM | POA: Insufficient documentation

## 2016-01-03 DIAGNOSIS — Z8719 Personal history of other diseases of the digestive system: Secondary | ICD-10-CM

## 2016-01-03 DIAGNOSIS — F17200 Nicotine dependence, unspecified, uncomplicated: Secondary | ICD-10-CM

## 2016-01-03 DIAGNOSIS — Z716 Tobacco abuse counseling: Secondary | ICD-10-CM

## 2016-01-03 DIAGNOSIS — E039 Hypothyroidism, unspecified: Secondary | ICD-10-CM

## 2016-01-03 DIAGNOSIS — Z122 Encounter for screening for malignant neoplasm of respiratory organs: Secondary | ICD-10-CM

## 2016-01-03 DIAGNOSIS — D751 Secondary polycythemia: Secondary | ICD-10-CM | POA: Insufficient documentation

## 2016-01-03 DIAGNOSIS — E559 Vitamin D deficiency, unspecified: Secondary | ICD-10-CM | POA: Diagnosis not present

## 2016-01-03 MED ORDER — OMEPRAZOLE 20 MG PO CPDR: 20.0000 mg | DELAYED_RELEASE_CAPSULE | Freq: Two times a day (BID) | ORAL | 5 refills | Status: DC

## 2016-01-03 MED ORDER — MONTELUKAST SODIUM 10 MG PO TABS: 10.0000 mg | ORAL_TABLET | Freq: Every day | ORAL | 1 refills | Status: DC

## 2016-01-03 MED ORDER — B COMPLEX-C PO TABS: 1.0000 | ORAL_TABLET | Freq: Every day | ORAL | Status: DC

## 2016-01-03 NOTE — Assessment & Plan Note (Signed)
Start Singulair. Explained to patient this will help with her breathing during the spring and fall and one allergies or environmental factors causing irritation to her lungs.

## 2016-01-03 NOTE — Assessment & Plan Note (Signed)
Pt's Vitamin D level is too low.  I recommend pt take a once weekly dose of 50 K IUs.  Please e-prescribe this and dispense #12 tablets with 10 refills.   Inform patient this will likely be a lifelong thing, and we will monitor it yearly.   In addition to the once weekly prescription dose, pt needs to take an OTC daily dose of 5,000 IUs of vitamin D3.  

## 2016-01-03 NOTE — Assessment & Plan Note (Signed)
Patient was on omeprazole the past. GI doctor told her to take omeprazole daily. Patient lost to follow-up. We will refill this medicine.

## 2016-01-03 NOTE — Progress Notes (Signed)
Assessment and plan:  1. Pulmonary emphysema, unspecified emphysema type ([place])   2. Vitamin D deficiency   3. Tobacco use disorder   4. Tobacco abuse counseling   5. Hypothyroidism, unspecified type   6. Erythrocytosis due to hypoxemia   7. Encounter for screening for lung cancer   8. History of gastroesophageal reflux (GERD)   9. Environmental and seasonal allergies     Vitamin D deficiency Pt's Vitamin D level is too low.  I recommend pt take a once weekly dose of 50 K IUs.  Please e-prescribe this and dispense #12 tablets with 10 refills.   Inform patient this will likely be a lifelong thing, and we will monitor it yearly.   In addition to the once weekly prescription dose, pt needs to take an OTC daily dose of 5,000 IUs of vitamin D3.   COPD (chronic obstructive pulmonary disease) ([place]) Will hold off on starting patient on any long-acting bronchodilators or steroids so that she can be properly evaluated by Dr. Elsworth Soho so he can have good baseline PFTs etc.  Pt has no inc in her SOB now- she is at baseline.   History of gastroesophageal reflux (GERD) Patient was on omeprazole the past. GI doctor told her to take omeprazole daily. Patient lost to follow-up. We will refill this medicine.  Environmental and seasonal allergies Start Singulair. Explained to patient this will help with her breathing during the spring and fall and one allergies or environmental factors causing irritation to her lungs.   Orders Placed This Encounter  Procedures  . CT CHEST LUNG CA SCREEN LOW DOSE W/O CM  . Ambulatory referral to Pulmonology    New Prescriptions   B COMPLEX-C (B-COMPLEX WITH VITAMIN C) TABLET    Take 1 tablet by mouth daily.   MONTELUKAST (SINGULAIR) 10 MG TABLET    Take 1 tablet (10 mg total) by mouth at bedtime.   OMEPRAZOLE (PRILOSEC) 20 MG CAPSULE    Take 1 capsule (20 mg total) by mouth 2 (two) times daily  before a meal.    Modified Medications   No medications on file    Discontinued Medications   CHLORPHENIRAMINE-HYDROCODONE (TUSSIONEX) 10-8 MG/5ML SUER    Take 5 mLs by mouth every 12 (twelve) hours as needed for cough (cough, will cause drowsiness.).   PREDNISONE (DELTASONE) 20 MG TABLET    Take 3 pills a day for 2 days, 2 pills a day for 2 days, 1 pill a day for 2 days then one half pill a day for 2 days then off     Return in about 4 weeks (around 01/31/2016) for After starting omeprazole and Singulair. Follow-up LDCT lung cancer screening as well.  Anticipatory guidance and routine counseling done re: condition, txmnt options and need for follow up. All questions of patient's were answered.   Gross side effects, risk and benefits, and alternatives of medications discussed with patient.  Patient is aware that all medications have potential side effects and we are unable to predict every sideeffect or drug-drug interaction that may occur.  Expresses verbal understanding and consents to current therapy plan and treatment regiment.  Please see AVS handed out to patient at the end of our visit for additional patient instructions/ counseling done pertaining to today's office visit.  Note: This document was prepared using Dragon voice recognition software and may include unintentional dictation errors.   ----------------------------------------------------------------------------------------------------------------------  Subjective:   CC:   Kellie Jones is a 55  y.o. female who presents to [facility] today for review and discussion of recent bloodwork that was done.  1. All recent blood work that we ordered was reviewed with patient today.  Patient was counseled on all abnormalities and we discussed dietary and lifestyle changes that could help those values (also medications when appropriate).  Extensive health counseling performed and all patient's concerns/  questions were addressed.  2. Pt goes to [facility] in [place]--> last seen over a yr ago.   Never gave pt meds- only gave her samples.  Using proventil 1-2 times per day.  3. Pt could not tolerate chantix--- made her feel evil.  Nicotine patch- rash, Tried vaping.  Not ready to quit.     Wt Readings from Last 3 Encounters:  01/03/16 124 lb 3.2 oz (56.3 kg)  12/07/15 124 lb (56.2 kg)  04/03/11 133 lb (60.3 kg)   BP Readings from Last 3 Encounters:  01/03/16 125/88  12/07/15 126/86  04/03/11 102/62   Pulse Readings from Last 3 Encounters:  01/03/16 87  12/07/15 85  04/03/11 64   BMI Readings from Last 3 Encounters:  01/03/16 20.99 kg/m  12/07/15 20.96 kg/m     Patient Care Team    Relationship Specialty Notifications Start End  Mellody Dance, DO PCP - General Family Medicine  12/07/15   Gardiner Rhyme, MD Referring Physician Specialist  12/08/15     Full medical history updated and reviewed in the office today  Patient Active Problem List   Diagnosis Date Noted  . Tobacco use disorder: >30pk yr hx 01/03/2016    Priority: High  . Hypothyroidism 12/07/2015    Priority: High  . COPD (chronic obstructive pulmonary disease) ([place]) 12/07/2015    Priority: High  . Vitamin D deficiency 01/03/2016    Priority: Medium  . Tobacco abuse counseling 01/03/2016  . Erythrocytosis due to hypoxemia 01/03/2016  . History of gastroesophageal reflux (GERD) 01/03/2016  . Environmental and seasonal allergies 01/03/2016  . Bilateral leg cramps 12/11/2015  . Migraine 12/07/2015  . Anxiety 12/07/2015  . Depression 12/07/2015  . Other fatigue 12/07/2015  . Absolute anemia 12/07/2015    Past Medical History:  Diagnosis Date  . Anxiety   . COPD (chronic obstructive pulmonary disease) ([place])   . Depression   . Hypothyroidism   . Migraine   . Neuropathic pain     Past Surgical History:  Procedure Laterality Date  . ABDOMINAL HYSTERECTOMY     total  . APPENDECTOMY    .  CHOLECYSTECTOMY      Social History  Substance Use Topics  . Smoking status: Current Every Day Smoker    Packs/day: 0.50    Years: 27.00  . Smokeless tobacco: Never Used  . Alcohol use Yes     Comment: rarely    Family Hx: Family History  Problem Relation Age of Onset  . Depression Mother   . Cancer Father 43    pancreatic  . Healthy Sister   . Healthy Brother   . Cancer Maternal Aunt 48    breast  . Cancer Maternal Grandmother 38    breast  . Healthy Brother   . Healthy Brother      Medications: Current Outpatient Prescriptions  Medication Sig Dispense Refill  . albuterol (PROVENTIL HFA;VENTOLIN HFA) 108 (90 Base) MCG/ACT inhaler Inhale 2 puffs into the lungs every 6 (six) hours as needed. For wheezing 1 Inhaler 1  . Aspirin-Salicylamide-Caffeine (BC HEADACHE) 325-95-16 MG TABS  Take 1 packet by mouth 3 (three) times daily as needed. For headache    . Cholecalciferol (VITAMIN D3) 5000 units CAPS Take 1 capsule by mouth daily.    Marland Kitchen ibuprofen (ADVIL,MOTRIN) 200 MG tablet Take 400 mg by mouth every 6 (six) hours as needed. For joint pain    . levothyroxine (SYNTHROID, LEVOTHROID) 150 MCG tablet Take 1 tablet (150 mcg total) by mouth daily before breakfast. 90 tablet 0  . metaxalone (SKELAXIN) 800 MG tablet Take 800 mg by mouth. One tablet 3-4 times daily as needed    . sertraline (ZOLOFT) 100 MG tablet Take 100 mg by mouth at bedtime.    . SUMAtriptan (IMITREX) 100 MG tablet Take 100 mg by mouth as needed.    . Vitamin D, Ergocalciferol, (DRISDOL) 50000 units CAPS capsule Take 1 capsule (50,000 Units total) by mouth every 7 (seven) days. 12 capsule 3  . B Complex-C (B-COMPLEX WITH VITAMIN C) tablet Take 1 tablet by mouth daily.    . montelukast (SINGULAIR) 10 MG tablet Take 1 tablet (10 mg total) by mouth at bedtime. 90 tablet 1  . omeprazole (PRILOSEC) 20 MG capsule Take 1 capsule (20 mg total) by mouth 2 (two) times daily before a meal. 60 capsule 5   No current  facility-administered medications for this visit.     Allergies:  Allergies  Allergen Reactions  . Iodinated Diagnostic Agents Anaphylaxis    Contrast media dye  . Codeine Rash     ROS: Review of Systems  Constitutional: Negative.  Negative for chills, diaphoresis, fever, malaise/fatigue and weight loss.  HENT: Negative.  Negative for congestion, sore throat and tinnitus.   Eyes: Negative.  Negative for blurred vision, double vision and photophobia.  Respiratory: Negative.  Negative for cough and wheezing.   Cardiovascular: Negative.  Negative for chest pain and palpitations.  Gastrointestinal: Negative.  Negative for blood in stool, diarrhea, nausea and vomiting.  Genitourinary: Negative.  Negative for dysuria, frequency and urgency.  Musculoskeletal: Negative.  Negative for joint pain and myalgias.  Skin: Negative.  Negative for itching and rash.  Neurological: Negative.  Negative for dizziness, focal weakness, weakness and headaches.  Endo/Heme/Allergies: Negative.  Negative for environmental allergies and polydipsia. Does not bruise/bleed easily.  Psychiatric/Behavioral: Negative.  Negative for depression and memory loss. The patient is not nervous/anxious and does not have insomnia.    Objective:  Blood pressure 125/88, pulse 87, weight 124 lb 3.2 oz (56.3 kg). Body mass index is 20.99 kg/m. Gen:   Well NAD, A and O *3 HEENT:    [place]/AT, EOMI,  MMM, OP- clr Lungs:   Normal work of breathing. CTA B/L, no Wh, rhonchi Heart:   RRR, S1, S2 WNL's, no MRG Abd:   No gross distention Exts:    warm, pink,  Brisk capillary refill, warm and well perfused.  Psych:    No HI/SI, judgement and insight good, Euthymic mood. Full Affect.   Recent Results (from the past 2160 hour(s))  COMPLETE METABOLIC PANEL WITH GFR     Status: None   Collection Time: 12/15/15  8:35 AM  Result Value Ref Range   Sodium 140 135 - 146 mmol/L   Potassium 4.6 3.5 - 5.3 mmol/L   Chloride 107 98 - 110 mmol/L    CO2 23 20 - 31 mmol/L   Glucose, Bld 94 65 - 99 mg/dL   BUN 12 7 - 25 mg/dL   Creat 0.74 0.50 - 1.05 mg/dL    Comment:  For patients > or = 55 years of age: The upper reference limit for Creatinine is approximately 13% higher for people identified as African-American.      Total Bilirubin 0.3 0.2 - 1.2 mg/dL   Alkaline Phosphatase 59 33 - 130 U/L   AST 14 10 - 35 U/L   ALT 12 6 - 29 U/L   Total Protein 6.6 6.1 - 8.1 g/dL   Albumin 4.4 3.6 - 5.1 g/dL   Calcium 9.3 8.6 - 10.4 mg/dL   GFR, Est African American >89 >=60 mL/min   GFR, Est Non African American >89 >=60 mL/min  CBC with Differential/Platelet     Status: Abnormal   Collection Time: 12/15/15  8:35 AM  Result Value Ref Range   WBC 9.6 3.8 - 10.8 K/uL   RBC 5.12 (H) 3.80 - 5.10 MIL/uL   Hemoglobin 16.4 (H) 11.7 - 15.5 g/dL   HCT 49.1 (H) 35.0 - 45.0 %   MCV 95.9 80.0 - 100.0 fL   MCH 32.0 27.0 - 33.0 pg   MCHC 33.4 32.0 - 36.0 g/dL   RDW 13.7 11.0 - 15.0 %   Platelets 311 140 - 400 K/uL   MPV 9.1 7.5 - 12.5 fL   Neutro Abs 5,280 1,500 - 7,800 cells/uL   Lymphs Abs 3,072 850 - 3,900 cells/uL   Monocytes Absolute 864 200 - 950 cells/uL   Eosinophils Absolute 288 15 - 500 cells/uL   Basophils Absolute 96 0 - 200 cells/uL   Neutrophils Relative % 55 %   Lymphocytes Relative 32 %   Monocytes Relative 9 %   Eosinophils Relative 3 %   Basophils Relative 1 %   Smear Review Criteria for review not met   Hemoglobin A1c     Status: None   Collection Time: 12/15/15  8:35 AM  Result Value Ref Range   Hgb A1c MFr Bld 5.0 <5.7 %    Comment:   For the purpose of screening for the presence of diabetes:   <5.7%       Consistent with the absence of diabetes 5.7-6.4 %   Consistent with increased risk for diabetes (prediabetes) >=6.5 %     Consistent with diabetes   This assay result is consistent with a decreased risk of diabetes.   Currently, no consensus exists regarding use of hemoglobin A1c for diagnosis of diabetes  in children.   According to American Diabetes Association (ADA) guidelines, hemoglobin A1c <7.0% represents optimal control in non-pregnant diabetic patients. Different metrics may apply to specific patient populations. Standards of Medical Care in Diabetes (ADA).      Mean Plasma Glucose 97 mg/dL  Hepatitis C antibody     Status: None   Collection Time: 12/15/15  8:35 AM  Result Value Ref Range   HCV Ab NEGATIVE NEGATIVE  HIV antibody     Status: None   Collection Time: 12/15/15  8:35 AM  Result Value Ref Range   HIV 1&2 Ab, 4th Generation NONREACTIVE NONREACTIVE    Comment:   HIV-1 antigen and HIV-1/HIV-2 antibodies were not detected.  There is no laboratory evidence of HIV infection.   HIV-1/2 Antibody Diff        Not indicated. HIV-1 RNA, Qual TMA          Not indicated.     PLEASE NOTE: This information has been disclosed to you from records whose confidentiality may be protected by state law. If your state requires such protection, then the state law prohibits  you from making any further disclosure of the information without the specific written consent of the person to whom it pertains, or as otherwise permitted by law. A general authorization for the release of medical or other information is NOT sufficient for this purpose.   The performance of this assay has not been clinically validated in patients less than 19 years old.   For additional information please refer to http://education.questdiagnostics.com/faq/FAQ106.  (This link is being provided for informational/educational purposes only.)     Lipid panel     Status: None   Collection Time: 12/15/15  8:35 AM  Result Value Ref Range   Cholesterol 181 125 - 200 mg/dL   Triglycerides 72 <150 mg/dL   HDL 64 >=46 mg/dL   Total CHOL/HDL Ratio 2.8 <=5.0 Ratio   VLDL 14 <30 mg/dL   LDL Cholesterol 103 <130 mg/dL    Comment:   Total Cholesterol/HDL Ratio:CHD Risk                        Coronary Heart Disease Risk  Table                                        Men       Women          1/2 Average Risk              3.4        3.3              Average Risk              5.0        4.4           2X Average Risk              9.6        7.1           3X Average Risk             23.4       11.0 Use the calculated Patient Ratio above and the CHD Risk table  to determine the patient's CHD Risk.   Magnesium     Status: None   Collection Time: 12/15/15  8:35 AM  Result Value Ref Range   Magnesium 2.0 1.5 - 2.5 mg/dL  Phosphorus     Status: None   Collection Time: 12/15/15  8:35 AM  Result Value Ref Range   Phosphorus 3.0 2.5 - 4.5 mg/dL  T4, free     Status: None   Collection Time: 12/15/15  8:35 AM  Result Value Ref Range   Free T4 1.2 0.8 - 1.8 ng/dL  TSH     Status: None   Collection Time: 12/15/15  8:35 AM  Result Value Ref Range   TSH 1.30 mIU/L    Comment:   Reference Range   > or = 20 Years  0.40-4.50   Pregnancy Range First trimester  0.26-2.66 Second trimester 0.55-2.73 Third trimester  0.43-2.91     VITAMIN D 25 Hydroxy (Vit-D Deficiency, Fractures)     Status: Abnormal   Collection Time: 12/15/15  8:35 AM  Result Value Ref Range   Vit D, 25-Hydroxy 14 (L) 30 - 100 ng/mL    Comment: Vitamin D Status           25-OH Vitamin D  Deficiency                <20 ng/mL        Insufficiency         20 - 29 ng/mL        Optimal             > or = 30 ng/mL   For 25-OH Vitamin D testing on patients on D2-supplementation and patients for whom quantitation of D2 and D3 fractions is required, the [facility] 25-OH VIT D, (D2,D3), LC/MS/MS is recommended: order code [id number] (patients > 2 yrs).   Vitamin B12     Status: None   Collection Time: 12/15/15  8:35 AM  Result Value Ref Range   Vitamin B-12 353 200 - 1,100 pg/mL

## 2016-01-03 NOTE — Patient Instructions (Addendum)
Told pt to think seriously about quitting smoking!  Told pt it is very important for his/her health and well being.    Smoking cessation instruction/counseling given:  counseled patient on the dangers of tobacco use, advised patient to stop smoking, and reviewed strategies to maximize success  Discussed with patient that there are multiple treatments to aid in quitting smoking, however I explained none will work unless pt really want to quit  Told to call 1-800-QUIT-NOW 606-355-3654) for free smoking cessation counseling and support, or pt can go online to www.heart.org - the American Heart Association website and search "quit smoking ".   Handouts given from [facility] website as well.     Guidelines for a Low Cholesterol, Low Saturated Fat Diet   Fats - Limit total intake of fats and oils. - Avoid butter, stick margarine, shortening, lard, palm and coconut oils. - Limit mayonnaise, salad dressings, gravies and sauces, unless they are homemade with low-fat ingredients. - Limit chocolate. - Choose low-fat and nonfat products, such as low-fat mayonnaise, low-fat or non-hydrogenated peanut butter, low-fat or fat-free salad dressings and nonfat gravy. - Use vegetable oil, such as canola or olive oil. - Look for margarine that does not contain trans fatty acids. - Use nuts in moderate amounts. - Read ingredient labels carefully to determine both amount and type of fat present in foods. Limit saturated and trans fats! - Avoid high-fat processed and convenience foods.  Meats and Meat Alternatives - Choose fish, chicken, [place] and lean meats. - Use dried beans, peas, lentils and tofu. - Limit egg yolks to three to four per week. - If you eat red meat, limit to no more than three servings per week and choose loin or round cuts. - Avoid fatty meats, such as bacon, sausage, franks, luncheon meats and ribs. - Avoid all organ meats, including liver.  Dairy - Choose nonfat or low-fat milk,  yogurt and cottage cheese. - Most cheeses are high in fat. Choose cheeses made from non-fat milk, such as mozzarella and ricotta cheese. - Choose light or fat-free cream cheese and sour cream. - Avoid cream and sauces made with cream.  Fruits and Vegetables - Eat a wide variety of fruits and vegetables. - Use lemon juice, vinegar or "mist" olive oil on vegetables. - Avoid adding sauces, fat or oil to vegetables.  Breads, Cereals and Grains - Choose whole-grain breads, cereals, pastas and rice. - Avoid high-fat snack foods, such as granola, cookies, pies, pastries, doughnuts and croissants.  Cooking Tips - Avoid deep fried foods. - Trim visible fat off meats and remove skin from poultry before cooking. - Bake, broil, boil, poach or roast poultry, fish and lean meats. - Drain and discard fat that drains out of meat as you cook it. - Add little or no fat to foods. - Use vegetable oil sprays to grease pans for cooking or baking. - Steam vegetables. - Use herbs or no-oil marinades to flavor foods.

## 2016-01-03 NOTE — Assessment & Plan Note (Signed)
Will hold off on starting patient on any long-acting bronchodilators or steroids so that she can be properly evaluated by Dr. Elsworth Soho so he can have good baseline PFTs etc.  Pt has no inc in her SOB now- she is at baseline.

## 2016-01-10 ENCOUNTER — Ambulatory Visit
Admission: RE | Admit: 2016-01-10 | Discharge: 2016-01-10 | Disposition: A | Discharge status: Home / Self Care | Payer: Managed Care, Other (non HMO) | Source: Ambulatory Visit | Attending: Family Medicine | Admitting: Family Medicine

## 2016-01-10 DIAGNOSIS — F17200 Nicotine dependence, unspecified, uncomplicated: Secondary | ICD-10-CM

## 2016-01-10 DIAGNOSIS — J439 Emphysema, unspecified: Secondary | ICD-10-CM

## 2016-01-10 DIAGNOSIS — Z122 Encounter for screening for malignant neoplasm of respiratory organs: Secondary | ICD-10-CM

## 2016-01-12 ENCOUNTER — Other Ambulatory Visit: Payer: Self-pay

## 2016-01-12 MED ORDER — SERTRALINE HCL 100 MG PO TABS: 100.0000 mg | ORAL_TABLET | Freq: Every day | ORAL | 0 refills | Status: DC

## 2016-01-19 ENCOUNTER — Encounter: Payer: Self-pay | Admitting: Internal Medicine

## 2016-01-19 ENCOUNTER — Ambulatory Visit (INDEPENDENT_AMBULATORY_CARE_PROVIDER_SITE_OTHER): Payer: Managed Care, Other (non HMO) | Admitting: Internal Medicine

## 2016-01-19 VITALS — BP 122/84 | HR 96 | Ht 64.0 in | Wt 125.8 lb

## 2016-01-19 DIAGNOSIS — K219 Gastro-esophageal reflux disease without esophagitis: Secondary | ICD-10-CM

## 2016-01-19 DIAGNOSIS — J449 Chronic obstructive pulmonary disease, unspecified: Secondary | ICD-10-CM | POA: Diagnosis not present

## 2016-01-19 DIAGNOSIS — F17200 Nicotine dependence, unspecified, uncomplicated: Secondary | ICD-10-CM | POA: Diagnosis not present

## 2016-01-19 MED ORDER — BUDESONIDE-FORMOTEROL FUMARATE 160-4.5 MCG/ACT IN AERO: 2.0000 | INHALATION_SPRAY | Freq: Two times a day (BID) | RESPIRATORY_TRACT | 0 refills | Status: DC

## 2016-01-19 MED ORDER — PREDNISONE 10 MG PO TABS: ORAL_TABLET | ORAL | 0 refills | Status: DC

## 2016-01-19 NOTE — Progress Notes (Signed)
Subjective:    Patient ID: Kellie Jones, female    DOB: 11/17/1960,    MRN: [id number]  HPI  56 yowf active smoker with new onset doe x fall 2016 gradually worse eval by [facility] dx of copd on singulair/albuterol but no better so referred to pulmonary clinic 01/19/2016 by Dr  Raliegh Scarlet   01/19/2016 1st [facility] office visit/ Amiree No   Chief Complaint  Patient presents with  . Pulmonary Consult    Referred by Dr. Everette Rank. Pt states dxed with COPD in 2016. She c/o worsening SOB since Fall 2016 "fall allergies but I have summer allergies too". She states that she gets SOB with exertion such as walking to the mailbox and when she lies down. She uses albuterol inhaler 3-4 x daily on average. She also c/o non prod cough, tends to be worse when she gets hot.   MB is 100 ft or so slt incline can do s stopping at slow pace = MMRC2 = can't walk a nl pace on a flat grade s sob but does fine slow and flat assoc with hoarseness and dry cough dx with GERD by [facility]  Breathing quite a bit better p rx with pred previously   No obvious patterns in day to day or daytime variability or assoc excess/ purulent sputum or mucus plugs or hemoptysis or cp or chest tightness, subjective wheeze or overt sinus symptoms. No unusual exp hx or h/o childhood pna/ asthma or knowledge of premature birth.  Sleeping ok without nocturnal  or early am exacerbation  of respiratory  c/o's or need for noct saba. Also denies any obvious fluctuation of symptoms with weather or environmental changes or other aggravating or alleviating factors except as outlined above   Current Medications, Allergies, Complete Past Medical History, Past Surgical History, Family History, and Social History were reviewed in [employer] record.     Review of Systems  Constitutional: Positive for unexpected weight change. Negative for chills and fever.  HENT: Positive for congestion. Negative for dental problem, ear  pain, nosebleeds, postnasal drip, rhinorrhea, sinus pressure, sneezing, sore throat, trouble swallowing and voice change.   Eyes: Negative for visual disturbance.  Respiratory: Positive for cough and shortness of breath. Negative for choking.   Cardiovascular: Negative for chest pain and leg swelling.  Gastrointestinal: Negative for abdominal pain, diarrhea and vomiting.       Acid heartburn Indigestion  Genitourinary: Negative for difficulty urinating.  Musculoskeletal: Positive for arthralgias.  Skin: Negative for rash.  Neurological: Positive for headaches. Negative for tremors and syncope.  Hematological: Does not bruise/bleed easily.       Objective:   Physical Exam  amb thin hoarse wf nad  Wt Readings from Last 3 Encounters:  01/19/16 125 lb 12.8 oz (57.1 kg)  01/03/16 124 lb 3.2 oz (56.3 kg)  12/07/15 124 lb (56.2 kg)    Vital signs reviewed   HEENT: nl dentition, turbinates, and oropharynx. Nl external ear canals without cough reflex   NECK :  without JVD/Nodes/TM/ nl carotid upstrokes bilaterally   LUNGS: no acc muscle use, slt barrel contour chest with pan exp distant bilateral wheeze    CV:  RRR  no s3 or murmur or increase in P2, no edema   ABD:  soft and nontender with late insp hoover's sign in the supine position. No bruits or organomegaly, bowel sounds nl  MS:  Nl gait/ ext warm without deformities, calf tenderness, cyanosis or clubbing No obvious joint restrictions  SKIN: warm and dry without lesions    NEURO:  alert, approp, nl sensorium with  no motor deficits       I personally reviewed images and agree with radiology impression as follows:  CT low dose screening Chest  11/131/7 1. Lung-RADS Category 2, benign appearance or behavior. Continue annual screening with low-dose chest CT without contrast in 12 months. 2. Emphysema.        Assessment & Plan:

## 2016-01-19 NOTE — Patient Instructions (Addendum)
Plan A = Automatic = Symbicort 160 Take 2 puffs first thing in am and then another 2 puffs about 12 hours later.    Work on inhaler technique:  relax and gently blow all the way out then take a nice smooth deep breath back in, triggering the inhaler at same time you start breathing in.  Hold for up to 5 seconds if you can. Blow out thru nose. Rinse and gargle with water when done     Plan B = Backup Only use your albuterol (ventolin) as a rescue medication to be used if you can't catch your breath by resting or doing a relaxed purse lip breathing pattern.  - The less you use it, the better it will work when you need it. - Ok to use the inhaler up to 2 puffs  every 4 hours if you must but call for appointment if use goes up over your usual need - Don't leave home without it !!  (think of it like the spare tire for your car)   Prednisone 10 mg take  4 each am x 2 days,   2 each am x 2 days,  1 each am x 2 days and stop   Try Prilosec 20mg  Take 30- 60 min before your first and last meals of the day    GERD (REFLUX)  is an extremely common cause of respiratory symptoms just like yours , many times with no obvious heartburn at all.    It can be treated with medication, but also with lifestyle changes including elevation of the head of your bed (ideally with 6 inch  bed blocks),  Smoking cessation, avoidance of late meals, excessive alcohol, and avoid fatty foods, chocolate, peppermint, colas, red wine, and acidic juices such as orange juice.  NO MINT OR MENTHOL PRODUCTS SO NO COUGH DROPS   USE SUGARLESS CANDY INSTEAD (Jolley ranchers or Stover's or Life Savers) or even ice chips will also do - the key is to swallow to prevent all throat clearing. NO OIL BASED VITAMINS - use powdered substitutes.   The key is to stop smoking completely before smoking completely stops you!   Please schedule a follow up office visit in 4 weeks, sooner if needed

## 2016-01-19 NOTE — Assessment & Plan Note (Addendum)
-   The proper method of use, as well as anticipated side effects, of a metered-dose inhaler are discussed and demonstrated to the patient. Improved effectiveness after extensive coaching during this visit to a level of approximately 75 % from a baseline of 50 % >>> rec laba/ics hfa first   Her h/o is c/w copd/AB but note still actively smoking and has not yet tried a consistent maint rx for copd and descibes feels quite a bit better p pred so may be more ab than emphysema (ie more reversible) despite CT findings.  rec trial of symb 160 2 bid awaiting Dr Maxie Barb notes/ pfts and Prednisone 10 mg take  4 each am x 2 days,   2 each am x 2 days,  1 each am x 2 days and stop   F/u p 4 weeks samples with spirometry on return   Total time devoted to counseling  = 35/40m review case with pt/ discussion of options/alternatives/ personally creating written instructions  in presence of pt  then going over those specific  Instructions directly with the pt including how to use all of the meds but in particular covering each new medication in detail and the difference between the maintenance/automatic meds and the prns using an action plan format for the latter.

## 2016-01-19 NOTE — Assessment & Plan Note (Signed)
Dx by [facility] [place] 2017  - rec diet/ppi bid ac 01/19/2016 >>>

## 2016-01-19 NOTE — Assessment & Plan Note (Addendum)
>   3 min discussion  I emphasized that although we never turn away smokers from the pulmonary clinic, we do ask that they understand that the recommendations that we make  won't work nearly as well in the presence of continued cigarette exposure  = "there is no antidote to reverse the effects of cigarettes on your lungs"   In fact, we may very well  reach a point where we can't promise to help the patient if he/she can't quit smoking. (We can and will promise to try to help, we just can't promise what we recommend will really work)

## 2016-02-15 ENCOUNTER — Encounter: Payer: Self-pay | Admitting: Family Medicine

## 2016-02-15 ENCOUNTER — Ambulatory Visit (INDEPENDENT_AMBULATORY_CARE_PROVIDER_SITE_OTHER): Payer: Managed Care, Other (non HMO) | Admitting: Family Medicine

## 2016-02-15 VITALS — BP 117/81 | HR 91 | Ht 64.0 in | Wt 123.4 lb

## 2016-02-15 DIAGNOSIS — J3089 Other allergic rhinitis: Secondary | ICD-10-CM

## 2016-02-15 DIAGNOSIS — Z23 Encounter for immunization: Secondary | ICD-10-CM | POA: Diagnosis not present

## 2016-02-15 DIAGNOSIS — F17200 Nicotine dependence, unspecified, uncomplicated: Secondary | ICD-10-CM

## 2016-02-15 DIAGNOSIS — E559 Vitamin D deficiency, unspecified: Secondary | ICD-10-CM

## 2016-02-15 DIAGNOSIS — K219 Gastro-esophageal reflux disease without esophagitis: Secondary | ICD-10-CM | POA: Diagnosis not present

## 2016-02-15 DIAGNOSIS — Z716 Tobacco abuse counseling: Secondary | ICD-10-CM | POA: Diagnosis not present

## 2016-02-15 DIAGNOSIS — J449 Chronic obstructive pulmonary disease, unspecified: Secondary | ICD-10-CM

## 2016-02-15 MED ORDER — MONTELUKAST SODIUM 10 MG PO TABS: 10.0000 mg | ORAL_TABLET | Freq: Every day | ORAL | 1 refills | Status: DC

## 2016-02-15 MED ORDER — VITAMIN D (ERGOCALCIFEROL) 1.25 MG (50000 UNIT) PO CAPS: 50000.0000 [IU] | ORAL_CAPSULE | ORAL | 10 refills | Status: DC

## 2016-02-15 NOTE — Progress Notes (Signed)
Impression and Recommendations:    1. Tobacco use disorder: >30pk yr hx   2. Tobacco abuse counseling   3. Gastroesophageal reflux disease, esophagitis presence not specified   4. Vitamin D deficiency   5. COPD still smoking    6. Environmental and seasonal allergies   7. Need for prophylactic vaccination and inoculation against influenza      Continue to encourage smoking cessation  Refer to GI for GERD on ppi  pulm for copd f/u  Allergies are now controlled. Continue meds.   No problem-specific Assessment & Plan notes found for this encounter.    Orders Placed This Encounter  Procedures  . Flu Vaccine QUAD 36+ mos PF IM (Fluarix & Fluzone Quad PF)  . Ambulatory referral to Gastroenterology     New Prescriptions   VITAMIN D, ERGOCALCIFEROL, (DRISDOL) 50000 UNITS CAPS CAPSULE    Take 1 capsule (50,000 Units total) by mouth every 7 (seven) days.    Modified Medications   Modified Medication Previous Medication   MONTELUKAST (SINGULAIR) 10 MG TABLET montelukast (SINGULAIR) 10 MG tablet      Take 1 tablet (10 mg total) by mouth at bedtime.    Take 1 tablet (10 mg total) by mouth at bedtime.    Discontinued Medications   PREDNISONE (DELTASONE) 10 MG TABLET    Take  4 each am x 2 days,   2 each am x 2 days,  1 each am x 2 days and stop   VITAMIN D, ERGOCALCIFEROL, (DRISDOL) 50000 UNITS CAPS CAPSULE    Take 1 capsule (50,000 Units total) by mouth every 7 (seven) days.    The patient was counseled, risk factors were discussed, anticipatory guidance given.  Gross side effects, risk and benefits, and alternatives of medications and treatment plan in general discussed with patient.  Patient is aware that all medications have potential side effects and we are unable to predict every side effect or drug-drug interaction that may occur.   Patient will call with any questions prior to using medication if they have concerns.  Expresses verbal understanding and consents to  current therapy and treatment regimen.  No barriers to understanding were identified.  Red flag symptoms and signs discussed in detail.  Patient expressed understanding regarding what to do in case of emergency\urgent symptoms  Return in about 3 months (around 05/15/2016).  Please see AVS handed out to patient at the end of our visit for further patient instructions/ counseling done pertaining to today's office visit.    Note: This document was prepared using Dragon voice recognition software and may include unintentional dictation errors.   --------------------------------------------------------------------------------------------------------------------------------------------------------------------------------------------------------------------------------------------    Subjective:    CC:  Chief Complaint  Patient presents with  . Follow-up    HPI: Kellie Jones is a 55 y.o. female who presents to [facility] today for issues as discussed below.   Vit D ---> only got 4 tabs of the wkly pills even though script shows #12 with 10RF.Marland Kitchen   She taking 5k IU daily.   Singulair- helps pt-- feels she can breath easier and allergies effect her less.   gerd-  Taking the omeprazole BID. Never been evaluated by GI in past 49yrs or more  cig-   Pt down to 1 pack every 2.5 days.  2 wks now.   To see pulm to f/u on copd.  Neg lung screening reviewed.     Wt Readings from Last 3 Encounters:  02/15/16 123 lb 6.4 oz (56 kg)  01/19/16 125 lb 12.8 oz (57.1 kg)  01/03/16 124 lb 3.2 oz (56.3 kg)   BP Readings from Last 3 Encounters:  02/15/16 117/81  01/19/16 122/84  01/03/16 125/88   Pulse Readings from Last 3 Encounters:  02/15/16 91  01/19/16 96  01/03/16 87   BMI Readings from Last 3 Encounters:  02/15/16 21.18 kg/m  01/19/16 21.59 kg/m  01/03/16 20.99 kg/m     Patient Care Team    Relationship Specialty Notifications Start End    Mellody Dance, DO PCP - General Family Medicine  12/07/15   Gardiner Rhyme, MD Referring Physician Specialist  12/08/15     Patient Active Problem List   Diagnosis Date Noted  . Tobacco use disorder: >30pk yr hx 01/03/2016    Priority: High  . Hypothyroidism 12/07/2015    Priority: High  . COPD still smoking  12/07/2015    Priority: High  . Vitamin D deficiency 01/03/2016    Priority: Medium  . GERD (gastroesophageal reflux disease) 01/19/2016  . Tobacco abuse counseling 01/03/2016  . Erythrocytosis due to hypoxemia 01/03/2016  . History of gastroesophageal reflux (GERD) 01/03/2016  . Environmental and seasonal allergies 01/03/2016  . Bilateral leg cramps 12/11/2015  . Migraine 12/07/2015  . Anxiety 12/07/2015  . Depression 12/07/2015  . Other fatigue 12/07/2015  . Absolute anemia 12/07/2015    Past Medical history, Surgical history, Family history, Social history, Allergies and Medications have been entered into the medical record, reviewed and changed as needed.   Allergies:  Allergies  Allergen Reactions  . Iodinated Diagnostic Agents Anaphylaxis    Contrast media dye  . Codeine Rash    Review of Systems  Constitutional: Negative for chills and fever.  Respiratory: Positive for cough and shortness of breath.   Cardiovascular: Negative for chest pain.  Gastrointestinal: Positive for nausea. Negative for vomiting.  Genitourinary: Positive for frequency.  Neurological: Negative for dizziness.     Objective:   Blood pressure 117/81, pulse 91, height 5\' 4"  (1.626 m), weight 123 lb 6.4 oz (56 kg), SpO2 95 %. Body mass index is 21.18 kg/m. General: Well Developed, well nourished, appropriate for stated age.  Neuro: Alert and oriented x3, extra-ocular muscles intact, sensation grossly intact.  HEENT: Normocephalic, atraumatic, neck supple, no carotid bruits appreciated  Skin: no gross rash. Cardiac: RRR, S1 S2 Respiratory: ECTA B/L, Not using accessory muscles,  speaking in full sentences-unlabored. Vascular:  Ext warm, dry, pink; cap RF less 2 sec. Psych: No HI/SI, judgement and insight good, Euthymic mood. Full Affect.

## 2016-02-15 NOTE — Patient Instructions (Signed)
Please think seriously about quitting smoking!  This is very important for your health and well being.    There are many things we can do to help you quit.  Let us know if you are interested.  You can also call 1-800-QUIT-NOW 912-715-2418) for free smoking cessation counseling and support. And please go online to www.heart.org to the American [employer] Association website and search "quit smoking ".   There is a lot of great information on this website for you to look over.

## 2016-04-03 ENCOUNTER — Other Ambulatory Visit: Payer: Self-pay | Admitting: Family Medicine

## 2016-04-13 ENCOUNTER — Other Ambulatory Visit: Payer: Self-pay | Admitting: Family Medicine

## 2016-04-17 ENCOUNTER — Encounter: Payer: Self-pay | Admitting: Family Medicine

## 2016-05-16 ENCOUNTER — Other Ambulatory Visit: Payer: Self-pay

## 2016-05-16 MED ORDER — SUMATRIPTAN SUCCINATE 100 MG PO TABS: 100.0000 mg | ORAL_TABLET | ORAL | 0 refills | Status: DC | PRN

## 2016-05-16 NOTE — Telephone Encounter (Signed)
We have not prescribed this medication for the patient in the past.  Please authorize if appropriate.  Charyl Bigger, CMA

## 2016-05-30 ENCOUNTER — Ambulatory Visit: Payer: Managed Care, Other (non HMO) | Admitting: Adult Health

## 2016-07-17 ENCOUNTER — Ambulatory Visit (INDEPENDENT_AMBULATORY_CARE_PROVIDER_SITE_OTHER): Payer: Managed Care, Other (non HMO) | Admitting: Family Medicine

## 2016-07-17 ENCOUNTER — Encounter: Payer: Self-pay | Admitting: Family Medicine

## 2016-07-17 VITALS — BP 118/73 | HR 87 | Ht 64.0 in | Wt 124.8 lb

## 2016-07-17 DIAGNOSIS — E039 Hypothyroidism, unspecified: Secondary | ICD-10-CM | POA: Diagnosis not present

## 2016-07-17 DIAGNOSIS — E559 Vitamin D deficiency, unspecified: Secondary | ICD-10-CM | POA: Diagnosis not present

## 2016-07-17 DIAGNOSIS — R252 Cramp and spasm: Secondary | ICD-10-CM

## 2016-07-17 DIAGNOSIS — R5383 Other fatigue: Secondary | ICD-10-CM | POA: Diagnosis not present

## 2016-07-17 DIAGNOSIS — Z1231 Encounter for screening mammogram for malignant neoplasm of breast: Secondary | ICD-10-CM | POA: Diagnosis not present

## 2016-07-17 DIAGNOSIS — K219 Gastro-esophageal reflux disease without esophagitis: Secondary | ICD-10-CM | POA: Diagnosis not present

## 2016-07-17 DIAGNOSIS — N6019 Diffuse cystic mastopathy of unspecified breast: Secondary | ICD-10-CM | POA: Diagnosis not present

## 2016-07-17 DIAGNOSIS — J3089 Other allergic rhinitis: Secondary | ICD-10-CM

## 2016-07-17 DIAGNOSIS — F419 Anxiety disorder, unspecified: Secondary | ICD-10-CM | POA: Diagnosis not present

## 2016-07-17 DIAGNOSIS — F3289 Other specified depressive episodes: Secondary | ICD-10-CM

## 2016-07-17 DIAGNOSIS — R208 Other disturbances of skin sensation: Secondary | ICD-10-CM | POA: Diagnosis not present

## 2016-07-17 DIAGNOSIS — J449 Chronic obstructive pulmonary disease, unspecified: Secondary | ICD-10-CM | POA: Diagnosis not present

## 2016-07-17 DIAGNOSIS — G43001 Migraine without aura, not intractable, with status migrainosus: Secondary | ICD-10-CM | POA: Diagnosis not present

## 2016-07-17 DIAGNOSIS — Z716 Tobacco abuse counseling: Secondary | ICD-10-CM

## 2016-07-17 DIAGNOSIS — Z1239 Encounter for other screening for malignant neoplasm of breast: Secondary | ICD-10-CM

## 2016-07-17 DIAGNOSIS — F17200 Nicotine dependence, unspecified, uncomplicated: Secondary | ICD-10-CM

## 2016-07-17 MED ORDER — LEVOTHYROXINE SODIUM 150 MCG PO TABS: 150.0000 ug | ORAL_TABLET | Freq: Every day | ORAL | 1 refills | Status: DC

## 2016-07-17 MED ORDER — SUMATRIPTAN SUCCINATE 100 MG PO TABS: 100.0000 mg | ORAL_TABLET | ORAL | 1 refills | Status: DC | PRN

## 2016-07-17 MED ORDER — SERTRALINE HCL 100 MG PO TABS: 150.0000 mg | ORAL_TABLET | Freq: Every day | ORAL | 1 refills | Status: DC

## 2016-07-17 MED ORDER — VITAMIN D (ERGOCALCIFEROL) 1.25 MG (50000 UNIT) PO CAPS: 50000.0000 [IU] | ORAL_CAPSULE | ORAL | 10 refills | Status: DC

## 2016-07-17 MED ORDER — GABAPENTIN 300 MG PO CAPS: 300.0000 mg | ORAL_CAPSULE | Freq: Three times a day (TID) | ORAL | 1 refills | Status: DC

## 2016-07-17 MED ORDER — BUDESONIDE-FORMOTEROL FUMARATE 160-4.5 MCG/ACT IN AERO: 2.0000 | INHALATION_SPRAY | Freq: Two times a day (BID) | RESPIRATORY_TRACT | 1 refills | Status: DC

## 2016-07-17 MED ORDER — ALBUTEROL SULFATE HFA 108 (90 BASE) MCG/ACT IN AERS: 1.0000 | INHALATION_SPRAY | Freq: Four times a day (QID) | RESPIRATORY_TRACT | 1 refills | Status: DC | PRN

## 2016-07-17 MED ORDER — OMEPRAZOLE 20 MG PO CPDR: 20.0000 mg | DELAYED_RELEASE_CAPSULE | Freq: Two times a day (BID) | ORAL | 5 refills | Status: DC

## 2016-07-17 MED ORDER — MONTELUKAST SODIUM 10 MG PO TABS: 10.0000 mg | ORAL_TABLET | Freq: Every day | ORAL | 1 refills | Status: DC

## 2016-07-17 NOTE — Patient Instructions (Addendum)
Will hold [facility] referral to Dr Elsworth Soho for now.  Pt will start using symbicort regularly and we will reassess.   Try to drink 70 oz water per day.    Go up on zoloft to 150mg  per day     For the Neurontin, this is a medicine that can potentially cause side effects and we will need to go up on the dose slowly over time.  - Start off with taking one Neurontin cap every evening for 3 days, if well tolerated then-  - take 1 cap twice daily for 3 days, if still well tolerated then-   - take 1 cap 3 times daily for 3 days, if tolerated then-   - take 1 in the morning, 1 in the afternoon and 2 caps at night for 3 days, if well tolerated-  (such as:  0,0,1 * 3 d 0,1,1 * 3 days,  1,1,1, * 3 days 1,1,2 * 3 days 1,2,2 * 3 days)   - take 1 in the morning,  2 in the afternoon, and 2 at night for 3 days and then slowly work your way up to 2 caps 3 times a day, then   - take 2, 2, 3 caps * 3 days etc.    We will go up on the dose and give you a new prescription once you are running low ( down to about 1 week of pills).    GO up to 900mg  Three times daily or dont increased dose if feet pain gone/ improved enough

## 2016-07-17 NOTE — Progress Notes (Signed)
Impression and Recommendations:    1. Tobacco abuse counseling   2. Vitamin D deficiency   3. Hypothyroidism, unspecified type   4. COPD still smoking    5. Tobacco use disorder: >30pk yr hx   6. Migraine without aura and with status migrainosus, not intractable   7. Anxiety   8. Other depression   9. Other fatigue   10. Gastroesophageal reflux disease, esophagitis presence not specified   11. Environmental and seasonal allergies   12. Hyperalgesia- skin of feet.    13. Fibrocystic breast changes, unspecified laterality   14. Breast cancer screening   15. Bilateral feet cramps- worse at night    Needs labs Oct  COPD still smoking  - Will hold off on Pulm f/up for now- per pt wishes.    Pt will start using symbicort regularly and we will reassess.  Goal albuterol is to be "rescue inhalaer" only and less than 2 times/wk use  Needs f/up with Pulm per their recs- told pt she can just ask to f/up with a different doc rather than [facility] if that's her preference.   Hypothyroidism Pt declines labs today to check TSH/ T4 levels  Inc stress is likely cause of inc fatigue/ feeling tired/ not rested  Cont meds  Tobacco use disorder: >30pk yr hx Consider vaping and dec nicotine intake since pt feels she can't cut back or quit any time soon  Hyperalgesia- skin of feet.  Will check labs next OV-- including b12, mag, phos, CMP, TSH, T4, A1c, vit D etc  - trial of neurontin ( slowly titrate up per pt instructions) after R/B d/c pt since per pt sx are daily and unrelenting      Other fatigue Labs next OV-- due in Oct  Stress mgt tech d/c pt   Vitamin D deficiency Reminded pt Vit D def can be increasing sx of fatigue as well.     Encouraged to take supp daily and wkly  Will reck levels next OV in Oct   Environmental and seasonal allergies Cont singulair along with OTC allegra or zyrtec etc  - quit smoking  - nasal flushes    Anxiety Inc dose zoloft to 150mg   per day----> from 100mg  qd now  walking daily to help with stress     Depression Inc zoloft  Reassess 6-8wks/ next OV  Follow-up sooner than planned if symptoms do not continue to improve or you have additional concerns/questions.   Bilateral feet cramps- worse at night Start Neurontin today's OV  Tobacco abuse counseling 5 min  Fibrocystic breast changes mammo ordered--- importance of screening d/c pt.     The patient was counseled, risk factors were discussed, anticipatory guidance given.  Modified Medications   Modified Medication Previous Medication   ALBUTEROL (PROVENTIL HFA;VENTOLIN HFA) 108 (90 BASE) MCG/ACT INHALER albuterol (PROVENTIL HFA;VENTOLIN HFA) 108 (90 Base) MCG/ACT inhaler      Inhale 1-2 puffs into the lungs every 6 (six) hours as needed. For wheezing    Inhale 2 puffs into the lungs every 6 (six) hours as needed. For wheezing   BUDESONIDE-FORMOTEROL (SYMBICORT) 160-4.5 MCG/ACT INHALER budesonide-formoterol (SYMBICORT) 160-4.5 MCG/ACT inhaler      Inhale 2 puffs into the lungs 2 (two) times daily.    Inhale 2 puffs into the lungs 2 (two) times daily.   LEVOTHYROXINE (SYNTHROID, LEVOTHROID) 150 MCG TABLET levothyroxine (SYNTHROID, LEVOTHROID) 150 MCG tablet      Take 1 tablet (150 mcg total) by mouth  daily.    TAKE 1 TABLET BY MOUTH DAILY BEFORE BREAKFAST.   MONTELUKAST (SINGULAIR) 10 MG TABLET montelukast (SINGULAIR) 10 MG tablet      Take 1 tablet (10 mg total) by mouth at bedtime.    Take 1 tablet (10 mg total) by mouth at bedtime.   OMEPRAZOLE (PRILOSEC) 20 MG CAPSULE omeprazole (PRILOSEC) 20 MG capsule      Take 1 capsule (20 mg total) by mouth 2 (two) times daily before a meal.    Take 1 capsule (20 mg total) by mouth 2 (two) times daily before a meal.   SERTRALINE (ZOLOFT) 100 MG TABLET sertraline (ZOLOFT) 100 MG tablet      Take 1.5 tablets (150 mg total) by mouth at bedtime.    TAKE 1 TABLET BY MOUTH AT BEDTIME.   SUMATRIPTAN (IMITREX) 100 MG  TABLET SUMAtriptan (IMITREX) 100 MG tablet      Take 1 tablet (100 mg total) by mouth as needed. Take along with 400mg  ibuprofen at onset of migraine    Take 1 tablet (100 mg total) by mouth as needed.   VITAMIN D, ERGOCALCIFEROL, (DRISDOL) 50000 UNITS CAPS CAPSULE Vitamin D, Ergocalciferol, (DRISDOL) 50000 units CAPS capsule      Take 1 capsule (50,000 Units total) by mouth every 7 (seven) days.    Take 1 capsule (50,000 Units total) by mouth every 7 (seven) days.     Meds ordered this encounter  Medications  . levothyroxine (SYNTHROID, LEVOTHROID) 150 MCG tablet    Sig: Take 1 tablet (150 mcg total) by mouth daily.    Dispense:  90 tablet    Refill:  1  . sertraline (ZOLOFT) 100 MG tablet    Sig: Take 1.5 tablets (150 mg total) by mouth at bedtime.    Dispense:  135 tablet    Refill:  1  . montelukast (SINGULAIR) 10 MG tablet    Sig: Take 1 tablet (10 mg total) by mouth at bedtime.    Dispense:  90 tablet    Refill:  1  . omeprazole (PRILOSEC) 20 MG capsule    Sig: Take 1 capsule (20 mg total) by mouth 2 (two) times daily before a meal.    Dispense:  60 capsule    Refill:  5  . albuterol (PROVENTIL HFA;VENTOLIN HFA) 108 (90 Base) MCG/ACT inhaler    Sig: Inhale 1-2 puffs into the lungs every 6 (six) hours as needed. For wheezing    Dispense:  1 Inhaler    Refill:  1  . budesonide-formoterol (SYMBICORT) 160-4.5 MCG/ACT inhaler    Sig: Inhale 2 puffs into the lungs 2 (two) times daily.    Dispense:  2 Inhaler    Refill:  1    Order Specific Question:   Lot Number?    Answer:   [id number]    Order Specific Question:   Expiration Date?    Answer:   12/28/2016    Order Specific Question:   Manufacturer?    Answer:   [employer] [[id number]]    Order Specific Question:   Quantity    Answer:   2  . SUMAtriptan (IMITREX) 100 MG tablet    Sig: Take 1 tablet (100 mg total) by mouth as needed. Take along with 400mg  ibuprofen at onset of migraine    Dispense:  10 tablet    Refill:  1  .  Vitamin D, Ergocalciferol, (DRISDOL) 50000 units CAPS capsule    Sig: Take 1 capsule (50,000 Units total) by mouth every  7 (seven) days.    Dispense:  12 capsule    Refill:  10  . gabapentin (NEURONTIN) 300 MG capsule    Sig: Take 1 capsule (300 mg total) by mouth 3 (three) times daily.    Dispense:  90 capsule    Refill:  1     Discontinued Medications   No medications on file      Orders Placed This Encounter  Procedures  . MM DIGITAL SCREENING BILATERAL     Gross side effects, risk and benefits, and alternatives of medications and treatment plan in general discussed with patient.  Patient is aware that all medications have potential side effects and we are unable to predict every side effect or drug-drug interaction that may occur.   Patient will call with any questions prior to using medication if they have concerns.  Expresses verbal understanding and consents to current therapy and treatment regimen.  No barriers to understanding were identified.  Red flag symptoms and signs discussed in detail.  Patient expressed understanding regarding what to do in case of emergency\urgent symptoms  Please see AVS handed out to patient at the end of our visit for further patient instructions/ counseling done pertaining to today's office visit.   Return in about 4 weeks (around 08/14/2016) for start neurontin, inc dose zoloft, restart symbicort.     Note: This document was prepared using Dragon voice recognition software and may include unintentional dictation errors.  Evalee Gerard 11:01 AM --------------------------------------------------------------------------------------------------------------------------------------------------------------------------------------------------------------------------------------------    Subjective:    CC:  Chief Complaint  Patient presents with  . Follow-up    HPI: Kellie Jones is a 56 y.o. female who presents to [place] at [facility] today for f/up of issues as discussed below.   TOB:   Had a quit date--> but oldest daughter got dx w Breast CA dx after last OV and pt been under A LOT more stress.  Not ready to quit/ consider quitting.     Stress:  Pt had a very difficult emotional problem with daughter's breast CA  dx-->  Pt doing well- good coping mechanisms.   Has friends at [employer] of support/ family around.   No need for change in meds per pt.    Hypothyroidism-   Very tired feeling lately- like she is not rested. Likely stress related.    COPD/ Allergic Bronchitis:  Breathing ok now.  Not using symbicort-- only albuterol.   - went to Dr Melvyn NovasFatima Sanger in Nov last yr.   Doesn't want to go back to him.  Still with bad SOB at times- esp seasonal.   She was lost to f/up and understands the sign dx she has warrants Pulm f/up.   HA- been bad with inc stress lately.   USing imitrex- 8d/mo on average.   Declines trying a preventative med such as topamax etc- was on in the past and too many s-e   Muscle cramping:  Feet cramping badly when ever sitting down and also skin feels like super sensitive when she puts on shoes- feels "like feet are gonna split".   Sx come and go.  Sitting makes worse, improved with activity     Wt Readings from Last 3 Encounters:  07/17/16 124 lb 12.8 oz (56.6 kg)  02/15/16 123 lb 6.4 oz (56 kg)  01/19/16 125 lb 12.8 oz (57.1 kg)   BP Readings from Last 3 Encounters:  07/17/16 118/73  02/15/16 117/81  01/19/16 122/84   Pulse  Readings from Last 3 Encounters:  07/17/16 87  02/15/16 91  01/19/16 96   BMI Readings from Last 3 Encounters:  07/17/16 21.42 kg/m  02/15/16 21.18 kg/m  01/19/16 21.59 kg/m     Patient Care Team    Relationship Specialty Notifications Start End  Mellody Dance, DO PCP - General Family Medicine  12/07/15   Gardiner Rhyme, MD Referring Physician Specialist  12/08/15      Patient Active Problem List   Diagnosis Date Noted    . Hyperalgesia- skin of feet.  07/17/2016    Priority: High  . Tobacco use disorder: >30pk yr hx 01/03/2016    Priority: High  . Hypothyroidism 12/07/2015    Priority: High  . COPD still smoking  12/07/2015    Priority: High  . Vitamin D deficiency 01/03/2016    Priority: Medium  . Environmental and seasonal allergies 01/03/2016    Priority: Medium  . Other fatigue 12/07/2015    Priority: Medium  . GERD (gastroesophageal reflux disease) 01/19/2016    Priority: Low  . Tobacco abuse counseling 01/03/2016    Priority: Low  . Bilateral feet cramps- worse at night 12/11/2015    Priority: Low  . Anxiety 12/07/2015    Priority: Low  . Depression 12/07/2015    Priority: Low  . Fibrocystic breast changes 07/17/2016  . Erythrocytosis due to hypoxemia 01/03/2016  . History of gastroesophageal reflux (GERD) 01/03/2016  . Migraine 12/07/2015  . Absolute anemia 12/07/2015    Past Medical history, Surgical history, Family history, Social history, Allergies and Medications have been entered into the medical record, reviewed and changed as needed.    Current Meds  Medication Sig  . albuterol (PROVENTIL HFA;VENTOLIN HFA) 108 (90 Base) MCG/ACT inhaler Inhale 1-2 puffs into the lungs every 6 (six) hours as needed. For wheezing  . Aspirin-Salicylamide-Caffeine (BC HEADACHE) 325-95-16 MG TABS Take 1 packet by mouth 3 (three) times daily as needed. For headache  . B Complex-C (B-COMPLEX WITH VITAMIN C) tablet Take 1 tablet by mouth daily.  . Cholecalciferol (VITAMIN D3) 5000 units CAPS Take 1 capsule by mouth daily.  Marland Kitchen ibuprofen (ADVIL,MOTRIN) 200 MG tablet Take 400 mg by mouth every 6 (six) hours as needed. For joint pain  . levothyroxine (SYNTHROID, LEVOTHROID) 150 MCG tablet Take 1 tablet (150 mcg total) by mouth daily.  . metaxalone (SKELAXIN) 800 MG tablet Take 800 mg by mouth. One tablet 3-4 times daily as needed  . montelukast (SINGULAIR) 10 MG tablet Take 1 tablet (10 mg total) by mouth  at bedtime.  Marland Kitchen omeprazole (PRILOSEC) 20 MG capsule Take 1 capsule (20 mg total) by mouth 2 (two) times daily before a meal.  . sertraline (ZOLOFT) 100 MG tablet Take 1.5 tablets (150 mg total) by mouth at bedtime.  . SUMAtriptan (IMITREX) 100 MG tablet Take 1 tablet (100 mg total) by mouth as needed. Take along with 400mg  ibuprofen at onset of migraine  . Vitamin D, Ergocalciferol, (DRISDOL) 50000 units CAPS capsule Take 1 capsule (50,000 Units total) by mouth every 7 (seven) days.  . [DISCONTINUED] albuterol (PROVENTIL HFA;VENTOLIN HFA) 108 (90 Base) MCG/ACT inhaler Inhale 2 puffs into the lungs every 6 (six) hours as needed. For wheezing  . [DISCONTINUED] levothyroxine (SYNTHROID, LEVOTHROID) 150 MCG tablet TAKE 1 TABLET BY MOUTH DAILY BEFORE BREAKFAST.  . [DISCONTINUED] montelukast (SINGULAIR) 10 MG tablet Take 1 tablet (10 mg total) by mouth at bedtime.  . [DISCONTINUED] omeprazole (PRILOSEC) 20 MG capsule Take 1 capsule (20 mg total) by  mouth 2 (two) times daily before a meal.  . [DISCONTINUED] sertraline (ZOLOFT) 100 MG tablet TAKE 1 TABLET BY MOUTH AT BEDTIME.  . [DISCONTINUED] SUMAtriptan (IMITREX) 100 MG tablet Take 1 tablet (100 mg total) by mouth as needed.  . [DISCONTINUED] Vitamin D, Ergocalciferol, (DRISDOL) 50000 units CAPS capsule Take 1 capsule (50,000 Units total) by mouth every 7 (seven) days.    Allergies:  Allergies  Allergen Reactions  . Iodinated Diagnostic Agents Anaphylaxis    Contrast media dye  . Codeine Rash     Review of Systems: General:   Denies fever, chills, unexplained weight loss.  Optho/Auditory:   Denies visual changes, blurred vision/LOV Respiratory:   Denies wheeze, DOE more than baseline levels.  Cardiovascular:   Denies chest pain, palpitations, new onset peripheral edema  Gastrointestinal:   Denies nausea, vomiting, diarrhea, abd pain.  Genitourinary: Denies dysuria, freq/ urgency, flank pain or discharge from genitals.  Endocrine:     Denies  hot or cold intolerance, polyuria, polydipsia. Musculoskeletal:   Denies unexplained myalgias, joint swelling, unexplained arthralgias, gait problems.  Skin:  Denies new onset rash, suspicious lesions Neurological:     Denies dizziness, unexplained weakness, numbness  Psychiatric/Behavioral:   Denies mood changes, suicidal or homicidal ideations, hallucinations    Objective:   Blood pressure 118/73, pulse 87, height 5\' 4"  (1.626 m), weight 124 lb 12.8 oz (56.6 kg). Body mass index is 21.42 kg/m. General:  Well Developed, well nourished, appropriate for stated age.  Neuro:  Alert and oriented,  extra-ocular muscles intact  HEENT:  Normocephalic, atraumatic, neck supple, no carotid bruits appreciated  Skin:  no gross rash, warm, pink. Pulses- intake, no muscle trigger pts, no calf swelling etc Cardiac:  RRR, S1 S2 Respiratory:  ECTA B/L and A/P but very distant and inc exhalation phase, Not using accessory muscles, speaking in full sentences- unlabored. Vascular:  Ext warm, no cyanosis apprec.; cap RF less 2 sec. Psych:  No HI/SI, judgement and insight good, Euthymic mood. Full Affect.

## 2016-07-18 ENCOUNTER — Encounter: Payer: Self-pay | Admitting: Family Medicine

## 2016-07-24 NOTE — Assessment & Plan Note (Signed)
mammo ordered--- importance of screening d/c pt.

## 2016-07-24 NOTE — Assessment & Plan Note (Signed)
Consider vaping and dec nicotine intake since pt feels she can't cut back or quit any time soon

## 2016-07-24 NOTE — Assessment & Plan Note (Signed)
Reminded pt Vit D def can be increasing sx of fatigue as well.     Encouraged to take supp daily and wkly  Will reck levels next OV in Oct

## 2016-07-24 NOTE — Assessment & Plan Note (Signed)
Start Neurontin today's OV

## 2016-07-24 NOTE — Assessment & Plan Note (Addendum)
Will check labs next OV-- including b12, mag, phos, CMP, TSH, T4, A1c, vit D etc  - trial of neurontin ( slowly titrate up per pt instructions) after R/B d/c pt since per pt sx are daily and unrelenting

## 2016-07-24 NOTE — Assessment & Plan Note (Signed)
Inc zoloft  Reassess 6-8wks/ next OV  Follow-up sooner than planned if symptoms do not continue to improve or you have additional concerns/questions.

## 2016-07-24 NOTE — Assessment & Plan Note (Signed)
Pt declines labs today to check TSH/ T4 levels  Inc stress is likely cause of inc fatigue/ feeling tired/ not rested  Cont meds

## 2016-07-24 NOTE — Assessment & Plan Note (Signed)
Inc dose zoloft to 150mg  per day----> from 100mg  qd now  walking daily to help with stress

## 2016-07-24 NOTE — Assessment & Plan Note (Addendum)
-   Will hold off on Pulm f/up for now- per pt wishes.    Pt will start using symbicort regularly and we will reassess.  Goal albuterol is to be "rescue inhalaer" only and less than 2 times/wk use  Needs f/up with Pulm per their recs- told pt she can just ask to f/up with a different doc rather than [facility] if that's her preference.

## 2016-07-24 NOTE — Assessment & Plan Note (Signed)
Labs next OV-- due in Oct  Stress mgt tech d/c pt

## 2016-07-24 NOTE — Assessment & Plan Note (Signed)
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  He presents for evaluation of platelet trends after completing Rituxan re-challenge      Prior Treatment:   Pulse dose dexamethasone (06/20/2019)   IVIG (06/09-06/15/2021) - 2x doses 1g/kg (no response)   Rituxan 375mg/m2 weekly x4 (07/02-07/23/2021)   Rituxan 375mg/m2 weekly x4 (10/27-11/16/2023)    Current Treatment:  Prednisone re-challenge (02/09/2022 - present)     History of Present Illness:   #ITP  -H/o thrombocytopenia in 2017 while on methotrexate. After stopping his methotrexate, his platelets normalized. He previously has had a very mild macrocytosis first noted in 2015, but less persistent beginning in 2017.  -06/2018 recurrent thrombocytopenia but with no clear etiology. Thrombocytopenia progressed with a nadir of 49 in 08/2018.  The rest of the CBC was unremarkable with a normal hemoglobin, hematocrit, MCV, and white blood cell count and differential.   Other labs in 06/2018 showed an unremarkable CMP, slightly elevated inflammatory markers with an ESR of 16 and CRP of 11.3. Vitamin B12, HCV, and HIV labs were unremarkable in 2017. Treated with dexamethasone 40mg daily x4. His platelets improved to 117 (peak) approx one month after dexamethasone.   -11/2018 recurrent severe thrombocytopenia, which responded well to dexamethasone.   -05/13/19 platelets 193.    -06/04/19 presented to [facility] with neurologic symptoms and platelets were 6. CT head and CTA head and neck showed an acute subdural hematoma underlying the left convexity measuring 0.3 cm at greatest width, a small volume subarachnoid hemorrhage along the superior left frontoparietal sulci, and a small well-circumscribed lesion felt most likely to be a meningioma. CTA head and neck showed no clear evidence of stenosis, dissection, nor occlusion. He was transferred to [facility] and treated conservatively with platelet transfusions and  prednisone --> then dexamethasone. His platelets increased with platelet transfusions.   -06/20/19, his platelet count was 76.  I recommended same-day initiation of dexamethasone, and follow-up today for repeat platelet check and discussion of next steps in treatment.  He went to fill his dexamethasone at the pharmacy, and for unclear reasons, his pharmacy was did not dispense this. Plts decreased to 62 on 06/23/19, but rebounded to 114 with a platelet transfusion and initiation of dexamethasone.   -07/03/19 s/p bone marrow biopsy with path showing no morphologic or immunophenotypic features of a lymphoproliferative d/o, slightly hypercellular marrow with no lymphoid aggregates. Flow cytometry showed a small kappa-restricted B cell population in the bone marrow aspirate, comprising 0.3% of white cells. Cytogenetics show normal female karyotype; NGS pending.   -08/06/19 plts trending downward, first dose of IVIG  -08/12/19 2nd dose of IVIG without a response to treatment   -08/29/19 - 09/19/19 weekly rituximab x4  -12/09/2021 - At routine follow-up, patient has platelets 29, Patient presents to the emergency department, noting dizziness and headaches; undergoes CT Head and Neck Angiogram demonstrating normal head CT, no acute infract, hemorrhage, or mass effect, no high grade stenosis or occlusion, normal CTA of the neck vessels, no stenosis, dissection or occlusion. No explanation sene for patient's dizziness. recommendation for pulse dose dexamethasone and urgent hematology follow-up   -12/12/2021 - Platelets 64   -12/15/2021 - Platelets 24, patient presents again to emergency department, repeat CT head demonstrates no acute intracranial abnormalities. Recommendation for Longer course Prednisone taper starting at 60mg PO Daily x1 week.   -12/19/2021 - Seen in follow-up in hematology clinic, recommendation for Rituxan re-challenge   -12/23/2021 -   Patient resumes Rituxan 375 mg/m2 weekly x4   -12/30/2021 - Platelets 76    -01/04/2022 - Platelets 96   -01/12/2022 - Platelets 94 - Final dose of Rituxan   -01/25/2022 - Platelets 88   -02/09/2022 - Patient's platelets start to drop to 25, recommendation to resume steroids given lack of efficacy of IVIG previously documented. Eltrombopag is ordered as next line therapy in the setting of refractory ITP.     Summary of call:   The patient was initially instructed to continue with Dexamethasone 40mg Daily x4 and then start prednisone taper at 40mg Daily; however, the pharmacy did not have dexamethasone and instead the patient started taking Prednisone 40mg daily. He is wondering if this is ok or if he should start taking dexamethasone. Given prior stability in his counts at this steroid dose, I felt that it was reasonable to continue prednisone 40mg. I wrote for a different dexamethasone prescription with 4mg tablets rather than 20mg tablets noting that the higher doses of dexamethasone sometimes are not carried by pharmacies.     Plan/Follow-up:    1). Continue Prednisone 40mg Daily   2). Follow-up CBC on Monday 12/18, subsequent follow-up (hopefully to start Eltrombopag) is on 02/16/2022. Recheck of labs on 02/16/2022   3). Patient should continue to hold apixaban, only resume if platelets >50,000.     Verbal consent was obtained for the telephone visit. I spent 5 minutes in telephone care for this patient, with >50% time spent in counseling and/or coordination of care regarding the management of immune thrombocytopenia.     Nik Kamat, MD  Hematology/Oncology   [facility]

## 2016-07-24 NOTE — Assessment & Plan Note (Signed)
Cont singulair along with OTC allegra or zyrtec etc  - quit smoking  - nasal flushes

## 2016-08-17 ENCOUNTER — Ambulatory Visit (INDEPENDENT_AMBULATORY_CARE_PROVIDER_SITE_OTHER): Payer: Managed Care, Other (non HMO) | Admitting: Family Medicine

## 2016-08-17 ENCOUNTER — Encounter: Payer: Self-pay | Admitting: Family Medicine

## 2016-08-17 VITALS — BP 122/86 | HR 83 | Ht 64.0 in | Wt 124.2 lb

## 2016-08-17 DIAGNOSIS — F3289 Other specified depressive episodes: Secondary | ICD-10-CM

## 2016-08-17 DIAGNOSIS — R252 Cramp and spasm: Secondary | ICD-10-CM

## 2016-08-17 DIAGNOSIS — F17200 Nicotine dependence, unspecified, uncomplicated: Secondary | ICD-10-CM

## 2016-08-17 DIAGNOSIS — R208 Other disturbances of skin sensation: Secondary | ICD-10-CM

## 2016-08-17 DIAGNOSIS — Z716 Tobacco abuse counseling: Secondary | ICD-10-CM

## 2016-08-17 DIAGNOSIS — J449 Chronic obstructive pulmonary disease, unspecified: Secondary | ICD-10-CM

## 2016-08-17 DIAGNOSIS — F419 Anxiety disorder, unspecified: Secondary | ICD-10-CM

## 2016-08-17 DIAGNOSIS — Z1239 Encounter for other screening for malignant neoplasm of breast: Secondary | ICD-10-CM

## 2016-08-17 DIAGNOSIS — Z803 Family history of malignant neoplasm of breast: Secondary | ICD-10-CM

## 2016-08-17 DIAGNOSIS — Z1231 Encounter for screening mammogram for malignant neoplasm of breast: Secondary | ICD-10-CM

## 2016-08-17 NOTE — Patient Instructions (Addendum)
Please give her information on [facility] or [facility] so she can go and schedule a mammogram. Please give her the phone number to call.     Apparently per patient, the referral we sent last time , she got no response from anyone about it.   may increase your Neurontin night time dose as tolerated.  Every 3 days change the dose and increase it.    Send me a message through my chart so you don't have to actually come back in to see me-->  please let me know how you're doing on the new dose and what dose you are at.    Please think seriously about quitting smoking!  This is very important for your health and well being.   Smoking cessation instruction/counseling given:  counseled patient on the dangers of tobacco use, advised patient to stop smoking, and reviewed strategies to maximize success  Discussed with patient that there are multiple treatments to aid in quitting smoking, however I explained none will work unless pt really wants to quit  Please let us know in the future if you are interested and ready to quit.  You can also call 1-800-QUIT-NOW (803) 111-0801) for free smoking cessation counseling and support.     Also, please go online to www.heart.org (the American Heart Association website) and search "quit smoking ".    Or try the centers for disease control website at: https://www.schmidt.com/  There is a lot of great information on these websites for you to look over.      Want to Quit Smoking? FDA-Approved Products Can Help  Quitting smoking can be hard, but it is possible. In fact, every time you put out a cigarette is a new chance to try quitting again, according to the U.S. Food and Drug Administration's newest tobacco education campaign, "Every Try Counts."   If you want to quit-almost 70 percent of adult smokers say they do-you may want to use a "smoking cessation" product proven to help.  Data has shown that using FDA-approved cessation medicine can double your chance of quitting successfully.  Some products contain nicotine as an active ingredient and others do not. These products include over-the-counter (OTC) options like skin patches, lozenges, and gum, as well as prescription medicines.  Smoking cessation products are intended to help you quit smoking. They are regulated through the [facility], which ensures that the products are safe and effective and that their benefits outweigh any known associated risks.  The Benefits of Quitting Smoking No matter how much you smoke-or for how long-quitting will benefit you.  Not only will you lower your risk of getting various cancers, including lung cancer, you'll also reduce your chances of having heart disease, a stroke, emphysema, and other serious diseases. Quitting also will lower the risk of heart disease and lung cancer in nonsmokers who otherwise would be exposed to your secondhand smoke.  Although there are benefits to quitting at any age, it is important to quit as soon as possible so your body can begin to heal from the damage caused by smoking. For instance, 12 hours after you quit smoking the carbon monoxide level in your blood drops to normal. Carbon monoxide is harmful because it displaces oxygen in the blood and deprives your heart, brain, and other vital organs of oxygen.  What To Know About Smoking Cessation Products Understanding how smoking cessation products work-and what side effects they may cause-can help you  determine which product may be best for you.  If you're considering one of these products, reading labels and talking to your pharmacist and other health care providers are good first steps to take.  You also can check the FDA's website for more information on each product at Drugs@FDA , where you can search for each product by name.  And remember to weigh each product's  benefits and risks, among other considerations.  About Nicotine Replacement Therapy (NRT) Nicotine is the substance primarily responsible for causing addiction to tobacco products. Tobacco users who are addicted to nicotine are used to having nicotine in their bodies.  As you try to quit smoking, you may have symptoms of nicotine withdrawal. When you quit, this withdrawal may cause symptoms like cravings, or urges, to smoke; depression; trouble sleeping; irritability; anxiety; and increased appetite.  Nicotine withdrawal can discourage some smokers from continuing with a quit attempt. But the FDA has approved several smoking cessation products designed to help users gradually withdraw from smoking (that is, "wean" themselves from smoking) by using specific amounts of nicotine that decrease over time. This type of product is called a "nicotine replacement therapy" or NRT. It supplies nicotine in controlled amounts while sparing you from other chemicals found in tobacco products.  NRTs are available over the counter and by prescription. You should generally use them only for a short time to help you manage nicotine cravings and withdrawal. However, the FDA recognizes that some people may need to use these products longer to stay smoke-free. Talk to your health care provider to determine the best course of treatment for you.  Over-the-counter NRTs are approved for sale to people age 79 and older. They are available under various brand names and sometimes as generic products. They include:  - Skin patches (also called "transdermal nicotine patches"). These patches are placed on the skin, similar to how you would apply an adhesive bandage. - Chewing gum (also called "nicotine gum"). This gum must be chewed according to the labeled instructions to be effective. - Lozenges (also called "nicotine lozenges"). You use these products by dissolving them in your mouth. For over-the-counter products, it's  important to follow the instructions on the Drug Facts Label (DFL) and to read the enclosed User's Guide for complete directions and other important information. Ask your health care provider if you have questions.  Currently, prescription nicotine replacement therapy is available only under the brand name Nicotrol, and is available both as a nasal spray and an oral inhaler. The products are FDA-approved only for use by adults.  If you are under age 79 and want to quit smoking, talk to a health care professional about whether you should use nicotine replacement therapies.  Important Advice for People Considering Nicotine Replacement Therapy Women who are pregnant or breastfeeding should talk to their health care providers and use nicotine replacement products only if the health care providers approve.  Also talk to your health care provider before using these products if you have:  diabetes, heart disease, asthma, or stomach ulcers; had a recent heart attack; high blood pressure that is not controlled with medicine; a history of irregular heartbeat; or been prescribed medication to help you quit smoking. If you take prescription medication for depression or asthma, tell your health care provider if you are quitting smoking because he or she may need to change your prescription dose.  Stop using a nicotine replacement product and call your health care professional if you have any of the following symptoms:  nausea; dizziness; weakness; vomiting; fast or irregular heartbeat; mouth problems with the lozenge or gum; or redness or swelling of the skin around the patch that does not go away.  About Prescription Cessation Medicines Without Nicotine  The FDA has approved two smoking cessation products that do not contain nicotine. They are Chantix (varenicline tartrate) and Zyban (buproprion hydrochloride). Both are available in tablet form and by prescription only.  Chantix acts at sites in  the brain affected by nicotine by reducing the rewarding effects of nicotine. The precise way that Zyban helps with smoking cessation is unknown.  As with other prescription products, the FDA has evaluated these medicines and found that the benefits outweigh the risks. For users taking these products, risks include changes in behavior, depressed mood, hostility, aggression, and suicidal thoughts or actions.  The most common side effects of Chantix include nausea; constipation; gas; vomiting; and trouble sleeping or vivid, unusual, or strange dreams. Chantix also may change how you react to alcohol, so talk to your health care provider about your drinking habits (if you drink alcohol) and whether these habits need to change. Chantix is not recommended for people under the age of 29.  The most commonly observed side effects consistently associated with the use of Zyban are dry mouth and insomnia.  Because Zyban contains the same active ingredient as the antidepressant Wellbutrin (bupropion), the FDA encourages people who use Zyban-and those who are considering it-to talk to their health care providers about the risks of treatment with antidepressant medicines. Zyban has not been studied in children under the age of 66 and is not approved for use in children and teenagers.  Note: If your health care provider prescribes Chantix or Zyban, please read the product's patient medication guide in its entirety. These guides offer important information on side effects, risks, warnings, product ingredients, and what you should talk about with your health care provider before taking the products.  Finally, if you ever have any side effects related to any smoking cessation products, or have any other problems related to your treatment, the FDA would like to hear from you. Please consider making a voluntary and confidential report to the FDA's MedWatch program.  Updated: February 07, 2016

## 2016-08-17 NOTE — Progress Notes (Signed)
Impression and Recommendations:    1. COPD still smoking    2. Tobacco use disorder: >30pk yr hx   3. Anxiety   4. Other depression   5. Bilateral feet cramps- worse at night   6. Family history of breast cancer in first degree relative    Needs labs Oct  No problem-specific Assessment & Plan notes found for this encounter.   The patient was counseled, risk factors were discussed, anticipatory guidance given.  Modified Medications   No medications on file     No orders of the defined types were placed in this encounter.    Discontinued Medications   No medications on file      No orders of the defined types were placed in this encounter.    Gross side effects, risk and benefits, and alternatives of medications and treatment plan in general discussed with patient.  Patient is aware that all medications have potential side effects and we are unable to predict every side effect or drug-drug interaction that may occur.   Patient will call with any questions prior to using medication if they have concerns.  Expresses verbal understanding and consents to current therapy and treatment regimen.  No barriers to understanding were identified.  Red flag symptoms and signs discussed in detail.  Patient expressed understanding regarding what to do in case of emergency\urgent symptoms  Please see AVS handed out to patient at the end of our visit for further patient instructions/ counseling done pertaining to today's office visit.   No Follow-up on file.     Note: This document was prepared using Dragon voice recognition software and may include unintentional dictation errors.  Mellody Dance 4:29 PM --------------------------------------------------------------------------------------------------------------------------------------------------------------------------------------------------------------------------------------------    Subjective:    CC:  Chief  Complaint  Patient presents with  . Follow-up    HPI: Kellie Jones is a 56 y.o. female who presents to [place] at [facility] today for f/up of issues as discussed below.   TOB:   Had a quit date--> but oldest daughter got dx w Breast CA dx after last OV and pt been under A LOT more stress.  Not ready to quit/ consider quitting.     Stress:  Pt had a very difficult emotional problem with daughter's breast CA  dx-->  Tolerating and doing well on the zoloft 150mg  --> up from 100mg  last OV.  Tolerating things much more now.     COPD/ Allergic Bronchitis:  using symbicort BID --> feels fantastic- best she has felt in yrs ( $81.00/ month) --  Never using albuterol- excpt 1 time in past 4 wks.   - went to Dr Melvyn NovasFatima Sanger in Nov last yr.    Doesn't want to go back to him.  Still with bad SOB at times- esp seasonal.   She was lost to f/up and understands the sign dx she has warrants Pulm f/up.  Muscle cramping:  Feet cramping badly when ever sitting down and also skin feels like super sensitive when she puts on shoes- feels "like feet are gonna split".   Sx come and go.  Sitting makes worse, improved with activity.   We started Neurontin last OV--> pt taking 2 every am and 2 at night.    Making her feel a little drunkish.     Wt Readings from Last 3 Encounters:  08/17/16 124 lb 3.2 oz (56.3 kg)  07/17/16 124 lb 12.8 oz (56.6 kg)  02/15/16 123 lb  6.4 oz (56 kg)   BP Readings from Last 3 Encounters:  08/17/16 122/86  07/17/16 118/73  02/15/16 117/81   Pulse Readings from Last 3 Encounters:  08/17/16 83  07/17/16 87  02/15/16 91   BMI Readings from Last 3 Encounters:  08/17/16 21.32 kg/m  07/17/16 21.42 kg/m  02/15/16 21.18 kg/m     Patient Care Team    Relationship Specialty Notifications Start End  Mellody Dance, DO PCP - General Family Medicine  12/07/15   Gardiner Rhyme, MD Referring Physician Specialist  12/08/15      Patient Active Problem List    Diagnosis Date Noted  . Family history of breast cancer in first degree relative---> daughter- at age 58 08/17/2016    Priority: High  . Hyperalgesia- skin of feet.  07/17/2016    Priority: High  . Tobacco use disorder: >30pk yr hx 01/03/2016    Priority: High  . Hypothyroidism 12/07/2015    Priority: High  . COPD still smoking  12/07/2015    Priority: High  . Vitamin D deficiency 01/03/2016    Priority: Medium  . Environmental and seasonal allergies 01/03/2016    Priority: Medium  . Other fatigue 12/07/2015    Priority: Medium  . GERD (gastroesophageal reflux disease) 01/19/2016    Priority: Low  . Tobacco abuse counseling 01/03/2016    Priority: Low  . Bilateral feet cramps- worse at night 12/11/2015    Priority: Low  . Anxiety 12/07/2015    Priority: Low  . Depression 12/07/2015    Priority: Low  . Fibrocystic breast changes 07/17/2016  . Erythrocytosis due to hypoxemia 01/03/2016  . History of gastroesophageal reflux (GERD) 01/03/2016  . Migraine 12/07/2015  . Absolute anemia 12/07/2015    Past Medical history, Surgical history, Family history, Social history, Allergies and Medications have been entered into the medical record, reviewed and changed as needed.    Current Meds  Medication Sig  . albuterol (PROVENTIL HFA;VENTOLIN HFA) 108 (90 Base) MCG/ACT inhaler Inhale 1-2 puffs into the lungs every 6 (six) hours as needed. For wheezing  . Aspirin-Salicylamide-Caffeine (BC HEADACHE) 325-95-16 MG TABS Take 1 packet by mouth 3 (three) times daily as needed. For headache  . B Complex-C (B-COMPLEX WITH VITAMIN C) tablet Take 1 tablet by mouth daily.  . budesonide-formoterol (SYMBICORT) 160-4.5 MCG/ACT inhaler Inhale 2 puffs into the lungs 2 (two) times daily.  . Cholecalciferol (VITAMIN D3) 5000 units CAPS Take 1 capsule by mouth daily.  Marland Kitchen gabapentin (NEURONTIN) 300 MG capsule Take 1 capsule (300 mg total) by mouth 3 (three) times daily.  Marland Kitchen ibuprofen (ADVIL,MOTRIN) 200 MG  tablet Take 400 mg by mouth every 6 (six) hours as needed. For joint pain  . levothyroxine (SYNTHROID, LEVOTHROID) 150 MCG tablet Take 1 tablet (150 mcg total) by mouth daily.  . metaxalone (SKELAXIN) 800 MG tablet Take 800 mg by mouth. One tablet 3-4 times daily as needed  . montelukast (SINGULAIR) 10 MG tablet Take 1 tablet (10 mg total) by mouth at bedtime.  Marland Kitchen omeprazole (PRILOSEC) 20 MG capsule Take 1 capsule (20 mg total) by mouth 2 (two) times daily before a meal.  . sertraline (ZOLOFT) 100 MG tablet Take 1.5 tablets (150 mg total) by mouth at bedtime.  . SUMAtriptan (IMITREX) 100 MG tablet Take 1 tablet (100 mg total) by mouth as needed. Take along with 400mg  ibuprofen at onset of migraine  . Vitamin D, Ergocalciferol, (DRISDOL) 50000 units CAPS capsule Take 1 capsule (50,000 Units total) by mouth  every 7 (seven) days.    Allergies:  Allergies  Allergen Reactions  . Iodinated Diagnostic Agents Anaphylaxis    Contrast media dye  . Codeine Rash     Review of Systems: General:   Denies fever, chills, unexplained weight loss.  Optho/Auditory:   Denies visual changes, blurred vision/LOV Respiratory:   Denies wheeze, DOE more than baseline levels.  Cardiovascular:   Denies chest pain, palpitations, new onset peripheral edema  Gastrointestinal:   Denies nausea, vomiting, diarrhea, abd pain.  Genitourinary: Denies dysuria, freq/ urgency, flank pain or discharge from genitals.  Endocrine:     Denies hot or cold intolerance, polyuria, polydipsia. Musculoskeletal:   Denies unexplained myalgias, joint swelling, unexplained arthralgias, gait problems.  Skin:  Denies new onset rash, suspicious lesions Neurological:     Denies dizziness, unexplained weakness, numbness  Psychiatric/Behavioral:   Denies mood changes, suicidal or homicidal ideations, hallucinations    Objective:   Blood pressure 122/86, pulse 83, height 5\' 4"  (1.626 m), weight 124 lb 3.2 oz (56.3 kg), SpO2 95 %. Body mass  index is 21.32 kg/m. General:  Well Developed, well nourished, appropriate for stated age.  Neuro:  Alert and oriented,  extra-ocular muscles intact  HEENT:  Normocephalic, atraumatic, neck supple, no carotid bruits appreciated  Skin:  no gross rash, warm, pink. Pulses- intake, no muscle trigger pts, no calf swelling etc Cardiac:  RRR, S1 S2 Respiratory:  ECTA B/L and A/P but very distant and inc exhalation phase, Not using accessory muscles, speaking in full sentences- unlabored. Vascular:  Ext warm, no cyanosis apprec.; cap RF less 2 sec. Psych:  No HI/SI, judgement and insight good, Euthymic mood. Full Affect.

## 2016-09-11 ENCOUNTER — Ambulatory Visit: Payer: Managed Care, Other (non HMO)

## 2016-09-26 ENCOUNTER — Ambulatory Visit: Payer: Managed Care, Other (non HMO)

## 2016-12-18 ENCOUNTER — Ambulatory Visit (INDEPENDENT_AMBULATORY_CARE_PROVIDER_SITE_OTHER): Payer: Managed Care, Other (non HMO) | Admitting: Family Medicine

## 2016-12-18 ENCOUNTER — Encounter: Payer: Self-pay | Admitting: Family Medicine

## 2016-12-18 VITALS — BP 133/83 | HR 73 | Ht 64.0 in | Wt 126.5 lb

## 2016-12-18 DIAGNOSIS — M5441 Lumbago with sciatica, right side: Secondary | ICD-10-CM

## 2016-12-18 DIAGNOSIS — R252 Cramp and spasm: Secondary | ICD-10-CM

## 2016-12-18 DIAGNOSIS — G8929 Other chronic pain: Secondary | ICD-10-CM

## 2016-12-18 DIAGNOSIS — Z716 Tobacco abuse counseling: Secondary | ICD-10-CM

## 2016-12-18 DIAGNOSIS — Z1231 Encounter for screening mammogram for malignant neoplasm of breast: Secondary | ICD-10-CM

## 2016-12-18 DIAGNOSIS — F17200 Nicotine dependence, unspecified, uncomplicated: Secondary | ICD-10-CM

## 2016-12-18 DIAGNOSIS — F3289 Other specified depressive episodes: Secondary | ICD-10-CM

## 2016-12-18 DIAGNOSIS — F419 Anxiety disorder, unspecified: Secondary | ICD-10-CM

## 2016-12-18 DIAGNOSIS — J449 Chronic obstructive pulmonary disease, unspecified: Secondary | ICD-10-CM

## 2016-12-18 DIAGNOSIS — Z1239 Encounter for other screening for malignant neoplasm of breast: Secondary | ICD-10-CM

## 2016-12-18 DIAGNOSIS — R208 Other disturbances of skin sensation: Secondary | ICD-10-CM

## 2016-12-18 NOTE — Progress Notes (Signed)
Impression and Recommendations:    1. Screening for breast cancer   2. Chronic midline low back pain with right-sided sciatica- s/p injury at work   3. Tobacco use disorder: >30pk yr hx   4. Tobacco abuse counseling   5. COPD still smoking    6. Other depression   7. Anxiety   8. Bilateral feet cramps- worse at night   9. Hyperalgesia- skin of feet.      1. Screening for breast cancer - MM Digital Screening; Future   No problem-specific Assessment & Plan notes found for this encounter.   The patient was counseled, risk factors were discussed, anticipatory guidance given.   New Prescriptions   No medications on file    Discontinued Medications   GABAPENTIN (NEURONTIN) 300 MG CAPSULE    Take 1 capsule (300 mg total) by mouth 3 (three) times daily.   VITAMIN D, ERGOCALCIFEROL, (DRISDOL) 50000 UNITS CAPS CAPSULE    Take 1 capsule (50,000 Units total) by mouth every 7 (seven) days.    Modified Medications   No medications on file     No orders of the defined types were placed in this encounter.    Orders Placed This Encounter  Procedures  . MM Digital Screening  . Hemoglobin A1c  . Lipid panel  . Comprehensive metabolic panel  . TSH  . VITAMIN D 25 Hydroxy (Vit-D Deficiency, Fractures)  . CBC with Differential/Platelet  . Ambulatory referral to Neurosurgery     Gross side effects, risk and benefits, and alternatives of medications and treatment plan in general discussed with patient.  Patient is aware that all medications have potential side effects and we are unable to predict every side effect or drug-drug interaction that may occur.   Patient will call with any questions prior to using medication if they have concerns.  Expresses verbal understanding and consents to current therapy and treatment regimen.  No barriers to understanding were identified.  Red flag symptoms and signs discussed in detail.  Patient expressed understanding regarding what to do in  case of emergency\urgent symptoms  Please see AVS handed out to patient at the end of our visit for further patient instructions/ counseling done pertaining to today's office visit.   Return in about 3 months (around 03/20/2017) for chronic f/up.     Note: This document was prepared using Dragon voice recognition software and may include unintentional dictation errors.  Mellody Dance 6:07 PM --------------------------------------------------------------------------------------------------------------------------------------------------------------------------------------------------------------------------------------------    Subjective:    CC:  Chief Complaint  Patient presents with  . Follow-up    HPI: Kellie Jones is a 56 y.o. female who presents to [place] [facility] today for issues as discussed below.  - Patient called [facility] and apparently they did not call her back.  We will put in referral to [facility] per patient request today.  -  Muscle cramping- told last office visit to go up on her Neurontin every 3 days.  She is currently taking- none, the muscle aches went away w/in one mo or taking the higher doses.  Patient has had no symptoms when off her Neurontin for couple months now.  - Patient has not thought about a quit date.  We talked extensively about it last time the patient is not ready to quit.   -  Since last OV-->  patient did have a injury at work.  She went to a workers comp physician- he is treating her pain- apparently  gave her a pain med she was allergic to.   She still in a significant amount of pain.  They did an MRI- it showed minimal degenerative disc disease.  Increased lumbar lordosis and lumbosacral angulation.  Slight anterolisthesis of L4 on L5.  Minimal lateral recess narrowing L3-L4 and L4-L5.  Minimal foraminal encroachment's L4-L5 and L5-S1.  No well defined root compressions with the patient supine.  Patient has  been to physical therapy for 3 months per patient.  She says is her pain is actually worse.  She is requesting that I send her to a back specialist for further evaluation.   TOB:   Had a quit date--> but oldest daughter got dx w Breast CA dx after last OV and pt been under A LOT more stress.  Not ready to quit/ consider quitting.     Stress:  Pt had a very difficult emotional problem with daughter's breast CA  dx-->  Tolerating and doing well on the zoloft 150mg  --> up from 100mg  last OV.  Tolerating things much more now.     COPD/ Allergic Bronchitis:  using symbicort BID --> feels fantastic- best she has felt in yrs ( $81.00/ month) --  Never using albuterol- excpt 1 time in past 4 wks.   - went to Dr Melvyn NovasFatima Sanger in Nov last yr.    Doesn't want to go back to him.  Still with bad SOB at times- esp seasonal.   She was lost to f/up and understands the sign dx she has warrants Pulm f/up.   Muscle cramping:  Feet cramping badly when ever sitting down and also skin feels like super sensitive when she puts on shoes- feels "like feet are gonna split".   Sx come and go.  Sitting makes worse, improved with activity.   We started Neurontin last OV--> pt taking 2 every am and 2 at night.    Making her feel a little drunkish.   Problem  Chronic midline low back pain with right-sided sciatica- s/p injury      Wt Readings from Last 3 Encounters:  12/18/16 126 lb 8 oz (57.4 kg)  08/17/16 124 lb 3.2 oz (56.3 kg)  07/17/16 124 lb 12.8 oz (56.6 kg)   BP Readings from Last 3 Encounters:  12/18/16 133/83  08/17/16 122/86  07/17/16 118/73   Pulse Readings from Last 3 Encounters:  12/18/16 73  08/17/16 83  07/17/16 87   BMI Readings from Last 3 Encounters:  12/18/16 21.71 kg/m  08/17/16 21.32 kg/m  07/17/16 21.42 kg/m     Patient Care Team    Relationship Specialty Notifications Start End  Mellody Dance, DO PCP - General Family Medicine  12/07/15   Gardiner Rhyme, MD Referring  Physician Specialist  12/08/15      Patient Active Problem List   Diagnosis Date Noted  . Family history of breast cancer in first degree relative---> daughter- at age 43 08/17/2016    Priority: High  . Hyperalgesia- skin of feet.  07/17/2016    Priority: High  . Tobacco use disorder: >30pk yr hx 01/03/2016    Priority: High  . Hypothyroidism 12/07/2015    Priority: High  . COPD still smoking  12/07/2015    Priority: High  . Vitamin D deficiency 01/03/2016    Priority: Medium  . Environmental and seasonal allergies 01/03/2016    Priority: Medium  . Other fatigue 12/07/2015    Priority: Medium  . GERD (gastroesophageal reflux disease) 01/19/2016    Priority:  Low  . Tobacco abuse counseling 01/03/2016    Priority: Low  . Bilateral feet cramps- worse at night 12/11/2015    Priority: Low  . Anxiety 12/07/2015    Priority: Low  . Depression 12/07/2015    Priority: Low  . 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 Fibrocystic breast changes 07/17/2016  . Erythrocytosis due to hypoxemia 01/03/2016  . History of gastroesophageal reflux (GERD) 01/03/2016  . Migraine 12/07/2015  . Absolute anemia 12/07/2015    Past Medical history, Surgical history, Family history, Social history, Allergies and Medications have been entered into the medical record, reviewed and changed as needed.    Current Meds  Medication Sig  . albuterol (PROVENTIL HFA;VENTOLIN HFA) 108 (90 Base) MCG/ACT inhaler Inhale 1-2 puffs into the lungs every 6 (six) hours as needed. For wheezing  . Aspirin-Salicylamide-Caffeine (BC HEADACHE) 325-95-16 MG TABS Take 1 packet by mouth 3 (three) times daily as needed. For headache  . B Complex-C (B-COMPLEX WITH VITAMIN C) tablet Take 1 tablet by mouth daily.  . budesonide-formoterol (SYMBICORT) 160-4.5 MCG/ACT inhaler Inhale 2 puffs into the lungs 2 (two) times daily.  . Cholecalciferol (VITAMIN D3) 5000 units CAPS Take 1 capsule  by mouth daily.  Marland Kitchen ibuprofen (ADVIL,MOTRIN) 200 MG tablet Take 400 mg by mouth every 6 (six) hours as needed. For joint pain  . levothyroxine (SYNTHROID, LEVOTHROID) 150 MCG tablet Take 1 tablet (150 mcg total) by mouth daily.  . metaxalone (SKELAXIN) 800 MG tablet Take 800 mg by mouth. One tablet 3-4 times daily as needed  . montelukast (SINGULAIR) 10 MG tablet Take 1 tablet (10 mg total) by mouth at bedtime.  Marland Kitchen omeprazole (PRILOSEC) 20 MG capsule Take 1 capsule (20 mg total) by mouth 2 (two) times daily before a meal.  . sertraline (ZOLOFT) 100 MG tablet Take 1.5 tablets (150 mg total) by mouth at bedtime.  . SUMAtriptan (IMITREX) 100 MG tablet Take 1 tablet (100 mg total) by mouth as needed. Take along with 400mg  ibuprofen at onset of migraine  . [DISCONTINUED] gabapentin (NEURONTIN) 300 MG capsule Take 1 capsule (300 mg total) by mouth 3 (three) times daily.  . [DISCONTINUED] Vitamin D, Ergocalciferol, (DRISDOL) 50000 units CAPS capsule Take 1 capsule (50,000 Units total) by mouth every 7 (seven) days.    Allergies:  Allergies  Allergen Reactions  . Iodinated Diagnostic Agents Anaphylaxis    Contrast media dye  . Codeine Rash     Review of Systems: General:   Denies fever, chills, unexplained weight loss.  Optho/Auditory:   Denies visual changes, blurred vision/LOV Respiratory:   Denies wheeze, DOE more than baseline levels.  Cardiovascular:   Denies chest pain, palpitations, new onset peripheral edema  Gastrointestinal:   Denies nausea, vomiting, diarrhea, abd pain.  Genitourinary: Denies dysuria, freq/ urgency, flank pain or discharge from genitals.  Endocrine:     Denies hot or cold intolerance, polyuria, polydipsia. Musculoskeletal:   Denies unexplained myalgias, joint swelling, unexplained arthralgias, gait problems.  Skin:  Denies new onset rash, suspicious lesions Neurological:     Denies dizziness, unexplained weakness, numbness  Psychiatric/Behavioral:   Denies mood  changes, suicidal or homicidal ideations, hallucinations    Objective:   Blood pressure 133/83, pulse 73, height 5\' 4"  (1.626 m), weight 126 lb 8 oz (57.4 kg). Body mass index is 21.71 kg/m. General:  Well Developed, well nourished, appropriate for stated age.  Neuro:  Alert and oriented,  extra-ocular muscles intact  HEENT:  Normocephalic, atraumatic, neck supple, no carotid bruits appreciated  Skin:  no gross rash, warm, pink. Cardiac:  RRR, S1 S2 Respiratory:  ECTA B/L and A/P, Not using accessory muscles, speaking in full sentences- unlabored. Vascular:  Ext warm, no cyanosis apprec.; cap RF less 2 sec. Psych:  No HI/SI, judgement and insight good, Euthymic mood. Full Affect.

## 2016-12-18 NOTE — Patient Instructions (Signed)
-   Please return to your workers comp physician about treating her pain until you are seen to the [place] neuro[facility] spine specialist that we referred [facility] per your request.  They are interventional pain management specialist and not just some when you go to for surgery etc.  Between this specialist and your workers comp physician, this will be who is treating your pain for this condition.  - please give pt info on smoking cess  - We got labs on you today, if there are abnormalities that we need to discuss in person, we will call you and ask you to come in for an office visit otherwise we can just call you per your request ( not send mychart message )

## 2016-12-19 LAB — CBC WITH DIFFERENTIAL/PLATELET
BASOS: 1 %
Basophils Absolute: 0.1 10*3/uL (ref 0.0–0.2)
EOS (ABSOLUTE): 0.1 10*3/uL (ref 0.0–0.4)
EOS: 1 %
HEMATOCRIT: 44.2 % (ref 34.0–46.6)
Hemoglobin: 15 g/dL (ref 11.1–15.9)
IMMATURE GRANS (ABS): 0 10*3/uL (ref 0.0–0.1)
IMMATURE GRANULOCYTES: 0 %
LYMPHS: 31 %
Lymphocytes Absolute: 2.9 10*3/uL (ref 0.7–3.1)
MCH: 31.6 pg (ref 26.6–33.0)
MCHC: 33.9 g/dL (ref 31.5–35.7)
MCV: 93 fL (ref 79–97)
Monocytes Absolute: 0.5 10*3/uL (ref 0.1–0.9)
Monocytes: 6 %
NEUTROS PCT: 61 %
Neutrophils Absolute: 5.8 10*3/uL (ref 1.4–7.0)
PLATELETS: 294 10*3/uL (ref 150–379)
RBC: 4.74 x10E6/uL (ref 3.77–5.28)
RDW: 13.9 % (ref 12.3–15.4)
WBC: 9.4 10*3/uL (ref 3.4–10.8)

## 2016-12-19 LAB — HEMOGLOBIN A1C
ESTIMATED AVERAGE GLUCOSE: 105 mg/dL
HEMOGLOBIN A1C: 5.3 % (ref 4.8–5.6)

## 2016-12-19 LAB — COMPREHENSIVE METABOLIC PANEL
A/G RATIO: 2.6 — AB (ref 1.2–2.2)
ALBUMIN: 4.9 g/dL (ref 3.5–5.5)
ALT: 9 IU/L (ref 0–32)
AST: 11 IU/L (ref 0–40)
Alkaline Phosphatase: 65 IU/L (ref 39–117)
BUN / CREAT RATIO: 13 (ref 9–23)
BUN: 9 mg/dL (ref 6–24)
CHLORIDE: 106 mmol/L (ref 96–106)
CO2: 20 mmol/L (ref 20–29)
Calcium: 9.2 mg/dL (ref 8.7–10.2)
Creatinine, Ser: 0.72 mg/dL (ref 0.57–1.00)
GFR calc non Af Amer: 94 mL/min/{1.73_m2} (ref >59)
GFR, EST AFRICAN AMERICAN: 108 mL/min/{1.73_m2} (ref >59)
Globulin, Total: 1.9 g/dL (ref 1.5–4.5)
Glucose: 89 mg/dL (ref 65–99)
POTASSIUM: 4 mmol/L (ref 3.5–5.2)
Sodium: 143 mmol/L (ref 134–144)
TOTAL PROTEIN: 6.8 g/dL (ref 6.0–8.5)

## 2016-12-19 LAB — LIPID PANEL
Chol/HDL Ratio: 2.9 ratio (ref 0.0–4.4)
Cholesterol, Total: 179 mg/dL (ref 100–199)
HDL: 61 mg/dL (ref >39)
LDL Calculated: 102 mg/dL — ABNORMAL HIGH (ref 0–99)
TRIGLYCERIDES: 80 mg/dL (ref 0–149)
VLDL Cholesterol Cal: 16 mg/dL (ref 5–40)

## 2016-12-19 LAB — TSH: TSH: 2.62 u[IU]/mL (ref 0.450–4.500)

## 2016-12-19 LAB — VITAMIN D 25 HYDROXY (VIT D DEFICIENCY, FRACTURES): Vit D, 25-Hydroxy: 28.1 ng/mL — ABNORMAL LOW (ref 30.0–100.0)

## 2017-01-09 ENCOUNTER — Encounter (INDEPENDENT_AMBULATORY_CARE_PROVIDER_SITE_OTHER): Payer: Self-pay | Admitting: Orthopaedic Surgery

## 2017-01-09 ENCOUNTER — Ambulatory Visit (INDEPENDENT_AMBULATORY_CARE_PROVIDER_SITE_OTHER): Payer: Worker's Compensation | Admitting: Orthopaedic Surgery

## 2017-01-09 VITALS — BP 123/75 | HR 78 | Ht 66.0 in | Wt 126.0 lb

## 2017-01-09 DIAGNOSIS — M47816 Spondylosis without myelopathy or radiculopathy, lumbar region: Secondary | ICD-10-CM

## 2017-01-09 MED ORDER — PREDNISONE 10 MG (21) PO TBPK: ORAL_TABLET | ORAL | 0 refills | Status: DC

## 2017-01-09 NOTE — Addendum Note (Signed)
Addended by: Meyer Cory on: 01/09/2017 04:55 PM   Modules accepted: Orders

## 2017-01-09 NOTE — Progress Notes (Signed)
Consult Note   Patient: Kellie Jones             Date of Birth: [birth year]           MRN: [id number]             Visit Date: 01/09/2017  Requested by: Reather Littler     [facility] PCP: Mellody Dance, DO  Thank you for asking me to evaluate this patient for Pain of the Lower Back  Below are my findings.     Assessment & Plan: Visit Diagnoses:  1. Facet syndrome, lumbar     Plan: Work slip given for no work pending lumbar facet injection at L4-5 bilaterally.  I reviewed the MRI scan with patient as well as with Mordecai Maes case manager whose fax number is [phone].  Prednisone 10 mg #21 Dosepak prescribed.  She will return after facet injection and then we can make a determination about modified duty resumption and gradual ramp up back to normal activity.  Follow Up Instructions: No Follow-up on file.  Orders: No orders of the defined types were placed in this encounter.  No orders of the defined types were placed in this encounter.    Procedures: No procedures performed   Clinical Data: No additional findings.   Subjective:  Chief Complaint  Patient presents with  . Lower Back - Pain     HPI 56 year old female seen for consultation at the request of [employer] concerning on job injury that started in June gradually got worse.  There is a problem with the machine requiring her to put her knees against the machine locked them and pull cylinder down doing this 1213 times a day multiple days a week.  She is had progressive increased back pain with pain worse rating down right leg than left leg.  Increased pain with bending.  She had one episode bending forward which she had bladder leakage.  She was treated conservatively with physical therapy without improvement ultimately had an MRI scan done at [facility] which showed some minimal degenerative changes with few millimeters anterolisthesis of L4-5 some increased facet fluid at the L4-5 facet joints and  minimal lateral recess narrowing L3-4 and L4-5.  Patient states she was on modified duty but this still involves bending and lifting.  Negative for bowel symptoms.  She denies dysuria or urinary frequency. Patient had at least in physical therapy visits.  MRI disc is available for review and is reviewed with patient as well as case Freight forwarder. Review of Systems review of systems updated positive for COPD she has had some recent mild increase dyspnea.  She has some seasonal allergies.  Positive history for anxiety depression she lives with her husband and other family members.  She has been with  Her present company  Working for about 9 years   Objective: Vital Signs: BP 123/75   Pulse 78   Ht 5\' 6"  (1.676 m)   Wt 126 lb (57.2 kg)   BMI 20.34 kg/m   Physical Exam  Constitutional: She is oriented to person, place, and time. She appears well-developed.  HENT:  Head: Normocephalic.  Right Ear: External ear normal.  Left Ear: External ear normal.  Eyes: Pupils are equal, round, and reactive to light.  Neck: No tracheal deviation present. No thyromegaly present.  Cardiovascular: Normal rate.  Pulmonary/Chest: Effort normal.  Abdominal: Soft.  Neurological: She is alert and oriented to person, place, and time.  Skin: Skin is warm and dry.  Psychiatric: She  has a normal mood and affect. Her behavior is normal.     Ortho Exam patient slow getting from sitting to standing.  With forward bending she uses her knees to get back to the upright position walking her hands up her thighs.  She has pain with palpation lumbosacral junction some tenderness over the greater trochanter.  Knee and ankle jerk are 3+ and symmetrical.  Full cervical range of motion no brachial plexus tenderness.  Tenderness palpation of the lumbar spine.  Negative popliteal compression test.  No pitting edema.  No quad or hip flexion weakness.  Patient has discomfort with forward flexion extension of the lumbar spine as well as  lateral build bending and tilting which is limited 50% and slow due to lumbar discomfort.  Specialty Comments:  No specialty comments available.  Imaging:  No results found.   PMFS History: Patient Active Problem List   Diagnosis Date Noted  . 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 Family history of breast cancer in first degree relative---> daughter- at age 61 08/17/2016  . Hyperalgesia- skin of feet.  07/17/2016  . Fibrocystic breast changes 07/17/2016  . GERD (gastroesophageal reflux disease) 01/19/2016  . Vitamin D deficiency 01/03/2016  . Tobacco use disorder: >30pk yr hx 01/03/2016  . Tobacco abuse counseling 01/03/2016  . Erythrocytosis due to hypoxemia 01/03/2016  . History of gastroesophageal reflux (GERD) 01/03/2016  . Environmental and seasonal allergies 01/03/2016  . Bilateral feet cramps- worse at night 12/11/2015  . Migraine 12/07/2015  . Hypothyroidism 12/07/2015  . COPD still smoking  12/07/2015  . Anxiety 12/07/2015  . Depression 12/07/2015  . Other fatigue 12/07/2015  . Absolute anemia 12/07/2015   Past Medical History:  Diagnosis Date  . Anxiety   . COPD (chronic obstructive pulmonary disease) ([place])   . Depression   . Hypothyroidism   . Migraine   . Neuropathic pain     Family History  Problem Relation Age of Onset  . Depression Mother   . Cancer Father 81       pancreatic  . Healthy Sister   . Healthy Brother   . Cancer Maternal Aunt 55       breast  . Cancer Maternal Grandmother 19       breast  . Healthy Brother   . Healthy Brother    Past Surgical History:  Procedure Laterality Date  . ABDOMINAL HYSTERECTOMY     total  . APPENDECTOMY    . CHOLECYSTECTOMY     Social History   Occupational History  . Not on file  Tobacco Use  . Smoking status: Current Every Day Smoker    Packs/day: 1.00    Years: 30.00    Pack years: 30.00  . Smokeless tobacco: Never Used  Substance and Sexual  Activity  . Alcohol use: Yes    Comment: rarely  . Drug use: No  . Sexual activity: No

## 2017-01-10 ENCOUNTER — Telehealth (INDEPENDENT_AMBULATORY_CARE_PROVIDER_SITE_OTHER): Payer: Self-pay | Admitting: Orthopaedic Surgery

## 2017-01-10 NOTE — Telephone Encounter (Signed)
Patient called asking for a doctors note from yesterday to be faxed to her employer. Fax # [phone] Attn: Tamela Oddi

## 2017-01-10 NOTE — Telephone Encounter (Signed)
Note faxed.

## 2017-01-15 ENCOUNTER — Encounter: Payer: Self-pay | Admitting: Family Medicine

## 2017-01-15 ENCOUNTER — Telehealth (INDEPENDENT_AMBULATORY_CARE_PROVIDER_SITE_OTHER): Payer: Self-pay | Admitting: Orthopaedic Surgery

## 2017-01-15 ENCOUNTER — Telehealth (INDEPENDENT_AMBULATORY_CARE_PROVIDER_SITE_OTHER): Payer: Self-pay | Admitting: Physical Medicine and Rehabilitation

## 2017-01-15 ENCOUNTER — Ambulatory Visit: Payer: Managed Care, Other (non HMO) | Admitting: Family Medicine

## 2017-01-15 VITALS — BP 128/81 | HR 72 | Temp 97.8°F | Ht 66.0 in | Wt 129.8 lb

## 2017-01-15 DIAGNOSIS — Z23 Encounter for immunization: Secondary | ICD-10-CM | POA: Diagnosis not present

## 2017-01-15 DIAGNOSIS — J449 Chronic obstructive pulmonary disease, unspecified: Secondary | ICD-10-CM

## 2017-01-15 MED ORDER — HYDROCODONE-HOMATROPINE 5-1.5 MG PO TABS: 1.0000 | ORAL_TABLET | Freq: Three times a day (TID) | ORAL | 0 refills | Status: DC | PRN

## 2017-01-15 MED ORDER — VARENICLINE TARTRATE 1 MG PO TABS: 1.0000 mg | ORAL_TABLET | Freq: Two times a day (BID) | ORAL | 3 refills | Status: DC

## 2017-01-15 MED ORDER — PREDNISONE 20 MG PO TABS: ORAL_TABLET | ORAL | 0 refills | Status: DC

## 2017-01-15 MED ORDER — VARENICLINE TARTRATE 0.5 MG X 11 & 1 MG X 42 PO MISC: ORAL | 0 refills | Status: DC

## 2017-01-15 NOTE — Patient Instructions (Addendum)
The most successful approach to take when starting Chantix is to make her quit date 2-4 weeks after starting the pills.  Please see all of the references below for extensive support materials to help you in your goals.  -Patient has tolerated Tussionex in the past.  She understands the risk of having a codeine allergy and taking these pills.  -Start the steroid after 1 week of quitting smoking.  This will help decrease the amount of phlegm and congestion you are experiencing with quitting smoking due to the cilia waking up   Quit date is December 19    FLEXIBLE QUIT APPROACH This quit approach may be right for you if you want to quit smoking, but want a little more flexibility in choosing when.  Start taking CHANTIX, then pick a quit date within a month from the date you start.  You can keep smoking until your quit date, while you prepare to quit.  You take CHANTIX for 12 weeks.   [employer]  https://www.chantix.com/support-for-taking-chantix/quit-smoking-tips-resources     Why should I quit smoking? There are many health benefits of quitting smoking, starting on day 1.  20 MINUTES After Quitting - Your heart rate may drop  12 HOURS After Quitting - Carbon monoxide levels in the blood may drop to normal  2 WEEKS to 3 MONTHS After Quitting - Your heart attack risk may begin to drop, and your lung function may improve  1 to 9 MONTHS After Quitting - Your coughing and shortness of breath may decrease  1 YEAR After Quitting - Your added risk of coronary heart disease may be half that of a continuing smoker  2 to 5 YEARS After Quitting - Your risk of cancers of the mouth, throat, esophagus, and bladder may be halved within 5 years. Your stroke risk may reduce to that of a non-smoker 2 to 5 years after quitting  10 YEARS After Quitting - The risk of dying from lung cancer may be half that of a person who is  still smoking. Your risk of cancers of the kidney and pancreas may decrease  15 YEARS After Quitting - Your risk of coronary heart disease is the same as that of a non-smoker  To find more information about the benefits of quitting, visit the Tips & Resources page.

## 2017-01-15 NOTE — Progress Notes (Signed)
Impression and Recommendations:    1. COPD still smoking    2. Need for influenza vaccination     Orders Placed This Encounter  Procedures  . Flu Vaccine QUAD 6+ mos PF IM (Fluarix Quad PF)   Current Meds  Medication Sig  . albuterol (PROVENTIL HFA;VENTOLIN HFA) 108 (90 Base) MCG/ACT inhaler Inhale 1-2 puffs into the lungs every 6 (six) hours as needed. For wheezing  . Aspirin-Salicylamide-Caffeine (BC HEADACHE) 325-95-16 MG TABS Take 1 packet by mouth 3 (three) times daily as needed. For headache  . B Complex-C (B-COMPLEX WITH VITAMIN C) tablet Take 1 tablet by mouth daily.  . budesonide-formoterol (SYMBICORT) 160-4.5 MCG/ACT inhaler Inhale 2 puffs into the lungs 2 (two) times daily.  . Cholecalciferol (VITAMIN D3) 5000 units CAPS Take 1 capsule by mouth daily.  Marland Kitchen ibuprofen (ADVIL,MOTRIN) 200 MG tablet Take 400 mg by mouth every 6 (six) hours as needed. For joint pain  . levothyroxine (SYNTHROID, LEVOTHROID) 150 MCG tablet Take 1 tablet (150 mcg total) by mouth daily.  . metaxalone (SKELAXIN) 800 MG tablet Take 800 mg by mouth. One tablet 3-4 times daily as needed  . montelukast (SINGULAIR) 10 MG tablet Take 1 tablet (10 mg total) by mouth at bedtime.  Marland Kitchen omeprazole (PRILOSEC) 20 MG capsule Take 1 capsule (20 mg total) by mouth 2 (two) times daily before a meal.  . sertraline (ZOLOFT) 100 MG tablet Take 1.5 tablets (150 mg total) by mouth at bedtime.  . SUMAtriptan (IMITREX) 100 MG tablet Take 1 tablet (100 mg total) by mouth as needed. Take along with 400mg  ibuprofen at onset of migraine      1. COPD still smoking    2. Need for influenza vaccination      Plan   Gross side effects, risk and benefits, and alternatives of medications and treatment plan in general discussed with patient.  Patient is aware that all medications have potential side effects and we are unable to predict every side effect or drug-drug interaction that may occur.   Patient will call with any  questions prior to using medication if they have concerns.  Expresses verbal understanding and consents to current therapy and treatment regimen.  No barriers to understanding were identified.  Red flag symptoms and signs discussed in detail.  Patient expressed understanding regarding what to do in case of emergency\urgent symptoms  Please see AVS handed out to patient at the end of our visit for further patient instructions/ counseling done pertaining to today's office visit.   Return in about 4 weeks (around 02/12/2017) for quit date Dec 19th.   F/up 4 wks.    Note: This note was prepared with assistance of Dragon voice recognition software. Occasional wrong-word or sound-a-like substitutions may have occurred due to the inherent limitations of voice recognition software.  Kellie Jones 10:21 AM --------------------------------------------------------------------------------------------------------------------------------------------------------------------------------------------------------------------------------------------    Subjective:    CC:  Chief Complaint  Patient presents with  . Nicotine Dependence    HPI: Kellie Jones is a 56 y.o. female who presents to [place] at [facility] today for issues as discussed below.  I know I can't breath and I need to quit smoking.       Wt Readings from Last 3 Encounters:  01/15/17 129 lb 12.8 oz (58.9 kg)  01/09/17 126 lb (57.2 kg)  12/18/16 126 lb 8 oz (57.4 kg)   BP Readings from Last 3 Encounters:  01/15/17 128/81  01/09/17 123/75  12/18/16 133/83  Pulse Readings from Last 3 Encounters:  01/15/17 72  01/09/17 78  12/18/16 73   BMI Readings from Last 3 Encounters:  01/15/17 20.95 kg/m  01/09/17 20.34 kg/m  12/18/16 21.71 kg/m     Patient Care Team    Relationship Specialty Notifications Start End  Mellody Dance, DO PCP - General Family Medicine  12/07/15   Gardiner Rhyme, MD Referring  Physician Specialist  12/08/15      Patient Active Problem List   Diagnosis Date Noted  . Family history of breast cancer in first degree relative---> daughter- at age 20 08/17/2016    Priority: High  . Hyperalgesia- skin of feet.  07/17/2016    Priority: High  . Tobacco use disorder: >30pk yr hx 01/03/2016    Priority: High  . Hypothyroidism 12/07/2015    Priority: High  . COPD still smoking  12/07/2015    Priority: High  . Vitamin D deficiency 01/03/2016    Priority: Medium  . Environmental and seasonal allergies 01/03/2016    Priority: Medium  . Other fatigue 12/07/2015    Priority: Medium  . GERD (gastroesophageal reflux disease) 01/19/2016    Priority: Low  . Tobacco abuse counseling 01/03/2016    Priority: Low  . Bilateral feet cramps- worse at night 12/11/2015    Priority: Low  . Anxiety 12/07/2015    Priority: Low  . Depression 12/07/2015    Priority: Low  . 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 Fibrocystic breast changes 07/17/2016  . Erythrocytosis due to hypoxemia 01/03/2016  . History of gastroesophageal reflux (GERD) 01/03/2016  . Migraine 12/07/2015  . Absolute anemia 12/07/2015    Past Medical history, Surgical history, Family history, Social history, Allergies and Medications have been entered into the medical record, reviewed and changed as needed.    Current Meds  Medication Sig  . albuterol (PROVENTIL HFA;VENTOLIN HFA) 108 (90 Base) MCG/ACT inhaler Inhale 1-2 puffs into the lungs every 6 (six) hours as needed. For wheezing  . Aspirin-Salicylamide-Caffeine (BC HEADACHE) 325-95-16 MG TABS Take 1 packet by mouth 3 (three) times daily as needed. For headache  . B Complex-C (B-COMPLEX WITH VITAMIN C) tablet Take 1 tablet by mouth daily.  . budesonide-formoterol (SYMBICORT) 160-4.5 MCG/ACT inhaler Inhale 2 puffs into the lungs 2 (two) times daily.  . Cholecalciferol (VITAMIN D3) 5000 units CAPS Take 1 capsule  by mouth daily.  Marland Kitchen ibuprofen (ADVIL,MOTRIN) 200 MG tablet Take 400 mg by mouth every 6 (six) hours as needed. For joint pain  . levothyroxine (SYNTHROID, LEVOTHROID) 150 MCG tablet Take 1 tablet (150 mcg total) by mouth daily.  . metaxalone (SKELAXIN) 800 MG tablet Take 800 mg by mouth. One tablet 3-4 times daily as needed  . montelukast (SINGULAIR) 10 MG tablet Take 1 tablet (10 mg total) by mouth at bedtime.  Marland Kitchen omeprazole (PRILOSEC) 20 MG capsule Take 1 capsule (20 mg total) by mouth 2 (two) times daily before a meal.  . sertraline (ZOLOFT) 100 MG tablet Take 1.5 tablets (150 mg total) by mouth at bedtime.  . SUMAtriptan (IMITREX) 100 MG tablet Take 1 tablet (100 mg total) by mouth as needed. Take along with 400mg  ibuprofen at onset of migraine    Allergies:  Allergies  Allergen Reactions  . Iodinated Diagnostic Agents Anaphylaxis    Contrast media dye  . Codeine Rash     Review of Systems: General:   Denies fever, chills, unexplained weight loss.  Optho/Auditory:   Denies  visual changes, blurred vision/LOV Respiratory:   Denies wheeze, DOE more than baseline levels.  Cardiovascular:   Denies chest pain, palpitations, new onset peripheral edema  Gastrointestinal:   Denies nausea, vomiting, diarrhea, abd pain.  Genitourinary: Denies dysuria, freq/ urgency, flank pain or discharge from genitals.  Endocrine:     Denies hot or cold intolerance, polyuria, polydipsia. Musculoskeletal:   Denies unexplained myalgias, joint swelling, unexplained arthralgias, gait problems.  Skin:  Denies new onset rash, suspicious lesions Neurological:     Denies dizziness, unexplained weakness, numbness  Psychiatric/Behavioral:   Denies mood changes, suicidal or homicidal ideations, hallucinations    Objective:   Blood pressure 128/81, pulse 72, temperature 97.8 F (36.6 C), height 5\' 6"  (1.676 m), weight 129 lb 12.8 oz (58.9 kg), SpO2 97 %. Body mass index is 20.95 kg/m. General:  Well Developed,  well nourished, appropriate for stated age.  Neuro:  Alert and oriented,  extra-ocular muscles intact  HEENT:  Normocephalic, atraumatic, neck supple, no carotid bruits appreciated  Skin:  no gross rash, warm, pink. Cardiac:  RRR, S1 S2 Respiratory:  ECTA B/L and A/P, Not using accessory muscles, speaking in full sentences- unlabored. Vascular:  Ext warm, no cyanosis apprec.; cap RF less 2 sec. Psych:  No HI/SI, judgement and insight good, Euthymic mood. Full Affect.

## 2017-01-15 NOTE — Telephone Encounter (Signed)
Jessica from [employer] left a message to schedule the patient's lumbar injection.  [phone].  Thank you.

## 2017-01-15 NOTE — Telephone Encounter (Signed)
Event organiser from [employer], Clydia Llano called on behalf of patient asking about her status of no work. She is curious if the patient told that accommodations would be made for her until the injection was done. She said she would need a note for their records stating that no work at all until the injection was done or if patient could go back and work in the office, etc. She would like a note emailed if possible.  [email or web address] # [phone]

## 2017-01-16 NOTE — Telephone Encounter (Signed)
Scheduled for 01/29/17.

## 2017-01-16 NOTE — Telephone Encounter (Signed)
Dr. Lorin Mercy gave patient a note for out of work and gave a copy to the case manager that was here in the office with her. Patient had also called back in a different message and asked that a copy of her note be faxed to her employer which was done. Can you tell me if this is someone else that I am supposed to be speaking with as well? Thanks.

## 2017-01-17 ENCOUNTER — Telehealth: Payer: Self-pay | Admitting: Family Medicine

## 2017-01-17 NOTE — Telephone Encounter (Signed)
Colletta Maryland from [facility] needs order clarification on patient's prescriptions. She can be reached at [phone]

## 2017-01-17 NOTE — Telephone Encounter (Signed)
Called pharmacy and discussed medications. MPulliam, CMA/RT(R)

## 2017-01-17 NOTE — Telephone Encounter (Signed)
Emailed the work note to her and advised of the injection date of 01/29/17

## 2017-01-29 ENCOUNTER — Ambulatory Visit (INDEPENDENT_AMBULATORY_CARE_PROVIDER_SITE_OTHER): Payer: Worker's Compensation | Admitting: Physical Medicine and Rehabilitation

## 2017-01-29 ENCOUNTER — Ambulatory Visit (INDEPENDENT_AMBULATORY_CARE_PROVIDER_SITE_OTHER): Payer: Worker's Compensation

## 2017-01-29 ENCOUNTER — Encounter (INDEPENDENT_AMBULATORY_CARE_PROVIDER_SITE_OTHER): Payer: Self-pay | Admitting: Physical Medicine and Rehabilitation

## 2017-01-29 VITALS — BP 132/91 | HR 74 | Temp 98.4°F

## 2017-01-29 DIAGNOSIS — M545 Low back pain, unspecified: Secondary | ICD-10-CM

## 2017-01-29 DIAGNOSIS — M47816 Spondylosis without myelopathy or radiculopathy, lumbar region: Secondary | ICD-10-CM | POA: Diagnosis not present

## 2017-01-29 DIAGNOSIS — G8929 Other chronic pain: Secondary | ICD-10-CM

## 2017-01-29 MED ORDER — METHYLPREDNISOLONE ACETATE 80 MG/ML IJ SUSP
80.0000 mg | Freq: Once | INTRAMUSCULAR | Status: AC
  Administered 2017-01-29: 80 mg

## 2017-01-29 MED ORDER — LIDOCAINE HCL (PF) 1 % IJ SOLN
2.0000 mL | Freq: Once | INTRAMUSCULAR | Status: AC
  Administered 2017-01-29: 2 mL

## 2017-01-29 NOTE — Progress Notes (Deleted)
She has throbbing in the middle but more painful on the right side. + Driver, - BT's, - Dye Allergy

## 2017-01-29 NOTE — Procedures (Signed)
Kellie Jones is a 56 year old female with chronic worsening axial low back pain a little bit more right than left.  She comes in today for planned bilateral L4-5 facet joint block diagnostically and therapeutically.  She will follow-up with Dr. Lorin Mercy.  The injection  will be diagnostic and hopefully therapeutic. The patient has failed conservative care including time, medications and activity modification.   Lumbar Facet Joint Intra-Articular Injection(s) with Fluoroscopic Guidance  Patient: Kellie Jones      Date of Birth: May 28, 1960 MRN: [id number] PCP: Mellody Dance, DO      Visit Date: 01/29/2017   Universal Protocol:    Date/Time: 01/29/2017  Consent Given By: the patient  Position: PRONE   Additional Comments: Vital signs were monitored before and after the procedure. Patient was prepped and draped in the usual sterile fashion. The correct patient, procedure, and site was verified.   Injection Procedure Details:  Procedure Site One Meds Administered:  Meds ordered this encounter  Medications  . lidocaine (PF) (XYLOCAINE) 1 % injection 2 mL  . methylPREDNISolone acetate (DEPO-MEDROL) injection 80 mg     Laterality: Bilateral  Location/Site:  L4-L5  Needle size: 22 guage  Needle type: Spinal  Needle Placement: Articular  Findings:  -Contrast Used: 0.5 mL iohexol 180 mg iodine/mL   -Comments: Excellent flow of contrast producing a partial arthrogram.  Procedure Details: The fluoroscope beam is vertically oriented in AP, and the inferior recess is visualized beneath the lower pole of the inferior apophyseal process, which represents the target point for needle insertion. When direct visualization is difficult the target point is located at the medial projection of the vertebral pedicle. The region overlying each aforementioned target is locally anesthetized with a 1 to 2 ml. volume of 1% Lidocaine without Epinephrine.   The spinal needle was inserted into each of  the above mentioned facet joints using biplanar fluoroscopic guidance. A 0.25 to 0.5 ml. volume of Isovue-250 was injected and a partial facet joint arthrogram was obtained. A single spot film was obtained of the resulting arthrogram.    One to 1.25 ml of the steroid/anesthetic solution was then injected into each of the facet joints noted above.   Additional Comments:  The patient tolerated the procedure well Dressing: Band-Aid    Post-procedure details: Patient was observed during the procedure. Post-procedure instructions were reviewed.  Patient left the clinic in stable condition.

## 2017-01-29 NOTE — Patient Instructions (Signed)

## 2017-01-31 ENCOUNTER — Telehealth (INDEPENDENT_AMBULATORY_CARE_PROVIDER_SITE_OTHER): Payer: Self-pay | Admitting: Orthopaedic Surgery

## 2017-01-31 MED ORDER — TRAMADOL HCL 50 MG PO TABS: 50.0000 mg | ORAL_TABLET | Freq: Two times a day (BID) | ORAL | 0 refills | Status: DC | PRN

## 2017-01-31 NOTE — Telephone Encounter (Signed)
Patient called asked if Dr Lorin Mercy can give her something for pain.  The number to contact patient is [phone]

## 2017-01-31 NOTE — Telephone Encounter (Signed)
Please advise 

## 2017-01-31 NOTE — Telephone Encounter (Signed)
I called to pharmacy. Patient advised.

## 2017-01-31 NOTE — Telephone Encounter (Signed)
OK ultram # 20 1 po bid prn pain  thanks

## 2017-02-02 ENCOUNTER — Telehealth (INDEPENDENT_AMBULATORY_CARE_PROVIDER_SITE_OTHER): Payer: Self-pay | Admitting: Orthopaedic Surgery

## 2017-02-02 NOTE — Telephone Encounter (Signed)
Emmons from [employer] called asking if any changes had been made to the patients restrictions. CB# [phone]

## 2017-02-09 NOTE — Telephone Encounter (Signed)
Pt has not returned to work. Still pending follow up post injection in the office.

## 2017-02-14 ENCOUNTER — Ambulatory Visit (INDEPENDENT_AMBULATORY_CARE_PROVIDER_SITE_OTHER): Payer: Worker's Compensation

## 2017-02-14 ENCOUNTER — Ambulatory Visit (INDEPENDENT_AMBULATORY_CARE_PROVIDER_SITE_OTHER): Payer: Worker's Compensation | Admitting: Orthopaedic Surgery

## 2017-02-14 ENCOUNTER — Telehealth (INDEPENDENT_AMBULATORY_CARE_PROVIDER_SITE_OTHER): Payer: Self-pay

## 2017-02-14 ENCOUNTER — Encounter (INDEPENDENT_AMBULATORY_CARE_PROVIDER_SITE_OTHER): Payer: Self-pay | Admitting: Orthopaedic Surgery

## 2017-02-14 VITALS — BP 131/91 | HR 84 | Ht 66.0 in | Wt 130.0 lb

## 2017-02-14 DIAGNOSIS — G8929 Other chronic pain: Secondary | ICD-10-CM

## 2017-02-14 DIAGNOSIS — M545 Low back pain: Secondary | ICD-10-CM

## 2017-02-14 NOTE — Telephone Encounter (Signed)
Faxed yesterdays office note and work note to [employer] adj

## 2017-02-14 NOTE — Telephone Encounter (Signed)
-----   Message from Marybelle Killings, MD sent at 02/14/2017 10:29 AM EST ----- w/c

## 2017-02-14 NOTE — Progress Notes (Signed)
Office Visit Note   Patient: Kellie Jones           Date of Birth: June 11, 1960           MRN: [id number] Visit Date: 02/14/2017              Requested by: Mellody Dance, DO [place], [place] [address] PCP: Mellody Dance, DO   Assessment & Plan: Visit Diagnoses:  Low back pain. 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    Plan: I discussed with the patient that she has mild facet degenerative changes mild lateral recess narrowing but no operative pathology.  We discussed smoking sensation to help and I discussed with her that I do not think she needs further intervention.  Work slip given for resuming regular work on 02/22/2017.  No impairment is assigned and I can check her back again on a as needed basis.  Follow-Up Instructions: No Follow-up on file.   Orders:  Orders Placed This Encounter  Procedures  . XR Lumbar Spine 2-3 Views   No orders of the defined types were placed in this encounter.     Procedures: No procedures performed   Clinical Data: No additional findings.   Subjective: Chief Complaint  Patient presents with  . Lower Back - Pain, Follow-up    HPI patient returns for follow-up she states she only got about 2 days relief from the lumbar facet injection done on 01/29/2017.  She states the pain radiates from her back radiates over toward her hip on the right side more than left side.  She is used some tramadol in the past.  She has been out of work since 01/09/2017.  MRI scan showed trace anterolisthesis at L4-5 with maintained disc space height and slight lateral recess narrowing at L3-4 and L4-5 without compression.   Review of Systems updated and unchanged from office visit 01/09/2017 other than as mentioned in HPI.   Objective: Vital Signs: BP (!) 131/91   Pulse 84   Ht 5\' 6"  (1.676 m)   Wt 130 lb (59 kg)   BMI 20.98 kg/m   Physical Exam  Constitutional: She is oriented to person, place, and  time. She appears well-developed.  HENT:  Head: Normocephalic.  Right Ear: External ear normal.  Left Ear: External ear normal.  Eyes: Pupils are equal, round, and reactive to light.  Neck: No tracheal deviation present. No thyromegaly present.  Cardiovascular: Normal rate.  Pulmonary/Chest: Effort normal.  Abdominal: Soft.  Neurological: She is alert and oriented to person, place, and time.  Skin: Skin is warm and dry.  Psychiatric: She has a normal mood and affect. Her behavior is normal.    Ortho Exam negative straight leg raising 90 degrees normal heel toe gait.  Anterior tib EHL gastrocsoleus is strong there is no atrophy.  Specialty Comments:  No specialty comments available.  Imaging: No results found.   PMFS History: Patient Active Problem List   Diagnosis Date Noted  . 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 Family history of breast cancer in first degree relative---> daughter- at age 5 08/17/2016  . Hyperalgesia- skin of feet.  07/17/2016  . Fibrocystic breast changes 07/17/2016  . GERD (gastroesophageal reflux disease) 01/19/2016  . Vitamin D deficiency 01/03/2016  . Tobacco use disorder: >30pk yr hx 01/03/2016  . Tobacco abuse counseling 01/03/2016  . Erythrocytosis due to hypoxemia 01/03/2016  . History of gastroesophageal reflux (GERD) 01/03/2016  .  Environmental and seasonal allergies 01/03/2016  . Bilateral feet cramps- worse at night 12/11/2015  . Migraine 12/07/2015  . Hypothyroidism 12/07/2015  . COPD still smoking  12/07/2015  . Anxiety 12/07/2015  . Depression 12/07/2015  . Other fatigue 12/07/2015  . Absolute anemia 12/07/2015   Past Medical History:  Diagnosis Date  . Anxiety   . COPD (chronic obstructive pulmonary disease) ([place])   . Depression   . Hypothyroidism   . Migraine   . Neuropathic pain     Family History  Problem Relation Age of Onset  . Depression Mother   . Cancer Father 54        pancreatic  . Healthy Sister   . Healthy Brother   . Cancer Maternal Aunt 53       breast  . Cancer Maternal Grandmother 34       breast  . Healthy Brother   . Healthy Brother     Past Surgical History:  Procedure Laterality Date  . ABDOMINAL HYSTERECTOMY     total  . APPENDECTOMY    . CHOLECYSTECTOMY     Social History   Occupational History  . Not on file  Tobacco Use  . Smoking status: Current Every Day Smoker    Packs/day: 1.00    Years: 30.00    Pack years: 30.00  . Smokeless tobacco: Never Used  Substance and Sexual Activity  . Alcohol use: Yes    Comment: rarely  . Drug use: No  . Sexual activity: No

## 2017-02-28 ENCOUNTER — Encounter: Payer: Self-pay | Admitting: Family Medicine

## 2017-02-28 ENCOUNTER — Ambulatory Visit: Payer: Managed Care, Other (non HMO) | Admitting: Family Medicine

## 2017-02-28 VITALS — BP 107/76 | HR 89 | Ht 66.0 in | Wt 133.7 lb

## 2017-02-28 DIAGNOSIS — M5441 Lumbago with sciatica, right side: Secondary | ICD-10-CM

## 2017-02-28 DIAGNOSIS — F419 Anxiety disorder, unspecified: Secondary | ICD-10-CM | POA: Diagnosis not present

## 2017-02-28 DIAGNOSIS — F321 Major depressive disorder, single episode, moderate: Secondary | ICD-10-CM | POA: Diagnosis not present

## 2017-02-28 DIAGNOSIS — Z87891 Personal history of nicotine dependence: Secondary | ICD-10-CM

## 2017-02-28 DIAGNOSIS — J449 Chronic obstructive pulmonary disease, unspecified: Secondary | ICD-10-CM | POA: Diagnosis not present

## 2017-02-28 DIAGNOSIS — F17200 Nicotine dependence, unspecified, uncomplicated: Secondary | ICD-10-CM | POA: Diagnosis not present

## 2017-02-28 DIAGNOSIS — G8929 Other chronic pain: Secondary | ICD-10-CM

## 2017-02-28 DIAGNOSIS — J4489 Other specified chronic obstructive pulmonary disease: Secondary | ICD-10-CM

## 2017-02-28 MED ORDER — SERTRALINE HCL 100 MG PO TABS: 200.0000 mg | ORAL_TABLET | Freq: Every day | ORAL | 1 refills | Status: DC

## 2017-02-28 NOTE — Patient Instructions (Addendum)
Please try to do sinus rinses twice daily.  Use distilled water.  AYR or Milta Deiters med sinus rinse  we will see you in 8 weeks from increasing the sertraline from 150- to -200 mg.    Please give patient doctor's note for today stating she was here being seen.

## 2017-02-28 NOTE — Progress Notes (Signed)
Impression and Recommendations:    1. Current moderate episode of major depressive disorder, unspecified whether recurrent ([place])   2. Anxiety   3. h/o Tobacco use disorder: >30pk yr hx   4. COPD with chronic bronchitis ([place])- referred to pulmonology again   5. History of tobacco use disorder   6. Chronic midline low back pain with right-sided sciatica- s/p work-related injury       1.  -Increased zoloft prescription from 150 mg to 100 mg BID.   -Advised the patient to take two 100 mg Zoloft tablets at night.   -Discussed and recommended that the patient follow up for evaluation by a counselor or a specialist, to which the patient declined referral at this time.   -Discussed and recommended that the patient follow up for evaluation with her pastor and church counselors, to which the patient declined.   Patient entered into verbal agreement with me and said she would not think of hurting herself or doing anything of the sort.  Denies suicidal ideation.  2.  -Discussed and advised with the patient to continue with the smoking cessation as she is now 34 days smoke free.   -Discussed and advised that the pt Symbicort could be switched to another meds or add additional one- to see if her breathing improves. The patient declined at this time.   -Discussed and recommended that the pt be sent to a pulmonologist for PFT.   She prefers a pulmonologist in either [place] or [place].   3.  -Advised the patient to call Dr. Lorin Mercy office for further evaluation of her symptoms.   -Advised the patient to keep appointment with [facility].  Pt was in the office today for 40+ minutes, with over 50% time spent in face to face counseling of patients various medical conditions, treatment plans of those medical conditions including medicine management and lifestyle modification, strategies to improve health and well being; and in coordination of care. SEE ABOVE FOR  DETAILS  -Follow up in 8 weeks for re-evaluation of increased zoloft dosage. Doctor's note provided to the patient today.    Orders Placed This Encounter  Procedures  . Ambulatory referral to Pulmonology    Meds ordered this encounter  Medications  . sertraline (ZOLOFT) 100 MG tablet    Sig: Take 2 tablets (200 mg total) by mouth at bedtime.    Dispense:  180 tablet    Refill:  1    Gross side effects, risk and benefits, and alternatives of medications and treatment plan in general discussed with patient.  Patient is aware that all medications have potential side effects and we are unable to predict every side effect or drug-drug interaction that may occur.   Patient will call with any questions prior to using medication if they have concerns.  Expresses verbal understanding and consents to current therapy and treatment regimen.  No barriers to understanding were identified.  Red flag symptoms and signs discussed in detail.  Patient expressed understanding regarding what to do in case of emergency\urgent symptoms  Please see AVS handed out to patient at the end of our visit for further patient instructions/ counseling done pertaining to today's office visit.   Return in about 8 weeks (around 04/25/2017) for After increasing sertraline from 150 to 200.    Note: This note was prepared with assistance of Dragon voice recognition software. Occasional wrong-word or sound-a-like substitutions may have occurred due to the inherent limitations of voice  recognition software.    This document serves as a record of services personally performed by Mellody Dance, DO. It was created on her behalf by Steva Colder, a trained medical scribe. The creation of this record is based on the scribe's personal observations and the provider's statements to them.   I have reviewed the above documentation for accuracy and completeness, and I agree with the above.   Mellody Dance 02/28/17 11:09  AM   --------------------------------------------------------------------------------------------------------------------------------------------------------------------------------------------------------------------------------------------    Subjective:     HPI: Kellie Jones is a 57 y.o. female who presents to [place] [facility] today for issues as discussed below. She notes that her holidays went well but they were stressful Jones.    Smoking cessation:  -She notes that she has not smoked in 34 days and that she has completed the starter pack of Chantix and she didn't continue on the continuing pack due to nausea.   -She notes that she changed her morning routine in order to aid with smoking cessation. She now drinks water prior to drinking coffee.   -Pt reports that for her last cigarette, she was unable to "suck the air in" and she noted that she was unable to breath well. She then used her Symbicort and albuterol inhaler with no relief of her symptoms.   -She then developed a mild cough and she self-treated with Mucinex with no relief of her symptoms. She still takes symbicort 2 puffs BID and albuterol PRN. She took the prescription prednisone without relief of her symptoms.   -Her breathing is worsened with activities and exertion.   -She has a pulmonologist in [place], [place].  Mood:  -She has noticed that she has external stressors with family.   -She is on 150 mg Zoloft and she voices concern for if this medication could be increased due increased external stressors with family, holidays, and her chronic lower back pain.  -She denies feeling suicidal at this time, but notes that due to her increased back pain and life stressors, she is feeling down. She notes "I promise you that Jesus and I don't have those conversations" regarding SI or feeling suicidal.   -Pt reports that the she will not likely speak to a counselor, specialist, or her pastoral  staff because she is "not a social butterfly and I can't breathe and my back hurts."  -She voices that she would like a work note for the remainder of the week due to her life issues.  GAD 7 : Generalized Anxiety Score 12/18/2016 08/17/2016 07/17/2016  Nervous, Anxious, on Edge 1 1 2   Control/stop worrying 1 0 0  Worry too much - different things 0 1 0  Trouble relaxing 2 0 0  Restless 1 0 0  Easily annoyed or irritable 1 1 2   Afraid - awful might happen 0 0 0  Total GAD 7 Score 6 3 4   Anxiety Difficulty - Not difficult at all Somewhat difficult    Depression screen [facility] 2/9 02/28/2017  Decreased Interest 3  Down, Depressed, Hopeless 0  PHQ - 2 Score 3  Altered sleeping 3  Tired, decreased energy 2  Change in appetite 3  Feeling bad or failure about yourself  0  Trouble concentrating 2  Moving slowly or fidgety/restless 3  Suicidal thoughts 0  PHQ-9 Score 16  Difficult doing work/chores Very difficult      Chronic Lower Back Pain:  -She was evaluated by Dr. Lorin Mercy at [facility] for her lower back  pain and she received a lumbar facet injection on 01/29/17 and had moderate relief for only 2 days.   -She was advised at her last visit with Dr. Lorin Mercy to continue taking Advil and per patient, Dr. Lorin Mercy discontinued her pain medications.   -She has not called Dr. Lorin Mercy office since the new occurrence of her lower back pain.   -She has a follow up appointment that she made with [place].      Wt Readings from Last 3 Encounters:  02/28/17 133 lb 11.2 oz (60.6 kg)  02/14/17 130 lb (59 kg)  01/15/17 129 lb 12.8 oz (58.9 kg)   BP Readings from Last 3 Encounters:  02/28/17 107/76  02/14/17 (!) 131/91  01/29/17 (!) 132/91   Pulse Readings from Last 3 Encounters:  02/28/17 89  02/14/17 84  01/29/17 74   BMI Readings from Last 3 Encounters:  02/28/17 21.58 kg/m  02/14/17 20.98 kg/m  01/15/17 20.95 kg/m     Patient Care Team     Relationship Specialty Notifications Start End  Mellody Dance, DO PCP - General Family Medicine  12/07/15   Gardiner Rhyme, MD Referring Physician Specialist  12/08/15   Marybelle Killings, MD Consulting Physician Orthopedic Surgery  02/28/17    Comment: txs her back- work related injury     Patient Active Problem List   Diagnosis Date Noted  . Family history of breast cancer in first degree relative---> daughter- at age 54 08/17/2016    Priority: High  . Hyperalgesia- skin of feet.  07/17/2016    Priority: High  . Tobacco use disorder: >30pk yr hx 01/03/2016    Priority: High  . Hypothyroidism 12/07/2015    Priority: High  . COPD still smoking  12/07/2015    Priority: High  . Vitamin D deficiency 01/03/2016    Priority: Medium  . Environmental and seasonal allergies 01/03/2016    Priority: Medium  . Other fatigue 12/07/2015    Priority: Medium  . GERD (gastroesophageal reflux disease) 01/19/2016    Priority: Low  . Tobacco abuse counseling 01/03/2016    Priority: Low  . Bilateral feet cramps- worse at night 12/11/2015    Priority: Low  . Anxiety 12/07/2015    Priority: Low  . Depression 12/07/2015    Priority: Low  . 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 Fibrocystic breast changes 07/17/2016  . Erythrocytosis due to hypoxemia 01/03/2016  . History of gastroesophageal reflux (GERD) 01/03/2016  . Migraine 12/07/2015  . Absolute anemia 12/07/2015    Past Medical history, Surgical history, Family history, Social history, Allergies and Medications have been entered into the medical record, reviewed and changed as needed.    Current Meds  Medication Sig  . albuterol (PROVENTIL HFA;VENTOLIN HFA) 108 (90 Base) MCG/ACT inhaler Inhale 1-2 puffs into the lungs every 6 (six) hours as needed. For wheezing  . Aspirin-Salicylamide-Caffeine (BC HEADACHE) 325-95-16 MG TABS Take 1 packet by mouth 3 (three) times daily as needed. For headache    . B Complex-C (B-COMPLEX WITH VITAMIN C) tablet Take 1 tablet by mouth daily.  . budesonide-formoterol (SYMBICORT) 160-4.5 MCG/ACT inhaler Inhale 2 puffs into the lungs 2 (two) times daily.  . Cholecalciferol (VITAMIN D3) 5000 units CAPS Take 1 capsule by mouth daily.  . Hydrocodone-Homatropine 5-1.5 MG TABS Take 1 tablet every 8 (eight) hours as needed by mouth (cough).  Marland Kitchen ibuprofen (ADVIL,MOTRIN) 200 MG tablet Take 400 mg by mouth every  6 (six) hours as needed. For joint pain  . levothyroxine (SYNTHROID, LEVOTHROID) 150 MCG tablet Take 1 tablet (150 mcg total) by mouth daily.  . metaxalone (SKELAXIN) 800 MG tablet Take 800 mg by mouth. One tablet 3-4 times daily as needed  . montelukast (SINGULAIR) 10 MG tablet Take 1 tablet (10 mg total) by mouth at bedtime.  Marland Kitchen omeprazole (PRILOSEC) 20 MG capsule Take 1 capsule (20 mg total) by mouth 2 (two) times daily before a meal.  . predniSONE (DELTASONE) 20 MG tablet Take 3 tabs po * 2 days, then 2 tabs for 2 d, then 1 tab 2 d, then 1/2 tab 2 days.  Marland Kitchen sertraline (ZOLOFT) 100 MG tablet Take 2 tablets (200 mg total) by mouth at bedtime.  . SUMAtriptan (IMITREX) 100 MG tablet Take 1 tablet (100 mg total) by mouth as needed. Take along with 400mg  ibuprofen at onset of migraine  . traMADol (ULTRAM) 50 MG tablet Take 1 tablet (50 mg total) by mouth 2 (two) times daily as needed for moderate pain.  . [DISCONTINUED] sertraline (ZOLOFT) 100 MG tablet Take 1.5 tablets (150 mg total) by mouth at bedtime.    Allergies:  Allergies  Allergen Reactions  . Iodinated Diagnostic Agents Anaphylaxis    Contrast media dye  . Codeine Rash     Review of Systems:  A fourteen system review of systems was performed and found to be positive as per HPI.   Objective:   Blood pressure 107/76, pulse 89, height 5\' 6"  (1.676 m), weight 133 lb 11.2 oz (60.6 kg), SpO2 97 %. Body mass index is 21.58 kg/m. General:  Well Developed, well nourished, appropriate for stated age.   Neuro:  Alert and oriented,  extra-ocular muscles intact  HEENT:  Normocephalic, atraumatic, neck supple, no carotid bruits appreciated  Skin:  no gross rash, warm, pink. Cardiac:  RRR, S1 S2 Respiratory:  Decreased aeration globally. Expiratory wheezes as well. Not using accessory muscles, speaking in full sentences- unlabored. Vascular:  Ext warm, no cyanosis apprec.; cap RF less 2 sec. Psych:  No HI/SI, judgement and insight good, Euthymic mood. Full Affect.

## 2017-03-02 ENCOUNTER — Encounter (INDEPENDENT_AMBULATORY_CARE_PROVIDER_SITE_OTHER): Payer: Self-pay | Admitting: Orthopaedic Surgery

## 2017-03-05 ENCOUNTER — Encounter: Payer: Self-pay | Admitting: Family Medicine

## 2017-03-05 ENCOUNTER — Other Ambulatory Visit: Payer: Self-pay | Admitting: Family Medicine

## 2017-03-05 ENCOUNTER — Telehealth (INDEPENDENT_AMBULATORY_CARE_PROVIDER_SITE_OTHER): Payer: Self-pay | Admitting: Orthopaedic Surgery

## 2017-03-05 ENCOUNTER — Encounter (INDEPENDENT_AMBULATORY_CARE_PROVIDER_SITE_OTHER): Payer: Self-pay | Admitting: Orthopaedic Surgery

## 2017-03-05 DIAGNOSIS — E039 Hypothyroidism, unspecified: Secondary | ICD-10-CM

## 2017-03-05 NOTE — Telephone Encounter (Signed)
Patient called asked if Dr Lorin Mercy would write her a note extending her time out of work? Patient advised she have not returned to work yet because the pain is still  bad. Patient also asked if she can get a refill on (Tramadol) The number to contact patient is [phone]

## 2017-03-05 NOTE — Telephone Encounter (Signed)
Please advise 

## 2017-03-05 NOTE — Telephone Encounter (Signed)
See note I sent to Laurann [place], see Jeneen Rinks Thursday in [facility] office , he can order lumbar myelo/CT, ROV to see me after [facility] approves and scan done. Cover till that visit thanks

## 2017-03-06 NOTE — Telephone Encounter (Signed)
I called patient and advised work note is ready.  She would like faxed to: 1.   [phone] attn: Jenelle Mages 2.   [phone] (husband's work)  Note faxed. I also placed copy of note for patient to pick up at front desk when she comes for her appt on Thursday with Jeneen Rinks.

## 2017-03-06 NOTE — Telephone Encounter (Signed)
Fyi.  I have given message to Jeneen Rinks in regards to ordering study for patient when she comes in on Thursday. I have also faxed work notes to both numbers that she requested. Just wanted you to be aware study will be ordered.

## 2017-03-06 NOTE — Telephone Encounter (Signed)
OK for work note  Remains out of work pending ROV after scan, scheduling of myelogram/CT scan rule out L4-5 lateral recess stenosis right worse than left with anterolithesis, degenerative. Let Benjiman Core PA-C know about plan so he can order when she is seen on Thursday by him. Will need to get W/C approval for scan, let Amy Bishop know , thanks. I called pt and discussed.

## 2017-03-08 ENCOUNTER — Encounter (INDEPENDENT_AMBULATORY_CARE_PROVIDER_SITE_OTHER): Payer: Self-pay | Admitting: Surgery

## 2017-03-08 ENCOUNTER — Ambulatory Visit (INDEPENDENT_AMBULATORY_CARE_PROVIDER_SITE_OTHER): Payer: Worker's Compensation | Admitting: Surgery

## 2017-03-08 ENCOUNTER — Ambulatory Visit (INDEPENDENT_AMBULATORY_CARE_PROVIDER_SITE_OTHER): Payer: Worker's Compensation

## 2017-03-08 VITALS — BP 132/96 | HR 96

## 2017-03-08 DIAGNOSIS — M4807 Spinal stenosis, lumbosacral region: Secondary | ICD-10-CM | POA: Diagnosis not present

## 2017-03-08 DIAGNOSIS — G8929 Other chronic pain: Secondary | ICD-10-CM

## 2017-03-08 DIAGNOSIS — M545 Low back pain, unspecified: Secondary | ICD-10-CM

## 2017-03-09 ENCOUNTER — Other Ambulatory Visit (INDEPENDENT_AMBULATORY_CARE_PROVIDER_SITE_OTHER): Payer: Self-pay | Admitting: Orthopaedic Surgery

## 2017-03-09 ENCOUNTER — Encounter (INDEPENDENT_AMBULATORY_CARE_PROVIDER_SITE_OTHER): Payer: Self-pay | Admitting: Orthopaedic Surgery

## 2017-03-09 MED ORDER — TRAMADOL HCL 50 MG PO TABS: 50.0000 mg | ORAL_TABLET | Freq: Two times a day (BID) | ORAL | 0 refills | Status: DC | PRN

## 2017-03-09 NOTE — Telephone Encounter (Signed)
Faxed the CT myelogram order from yesterday to wc adj Jeannine Kitten. Fax#[phone]. Waiting on office note. See referral.

## 2017-03-09 NOTE — Telephone Encounter (Signed)
Ok refill thanks 

## 2017-03-09 NOTE — Telephone Encounter (Signed)
Kellie Jones pt

## 2017-03-09 NOTE — Telephone Encounter (Signed)
Please advise 

## 2017-03-12 NOTE — Telephone Encounter (Signed)
FYI

## 2017-03-26 ENCOUNTER — Encounter (INDEPENDENT_AMBULATORY_CARE_PROVIDER_SITE_OTHER): Payer: Self-pay | Admitting: Orthopaedic Surgery

## 2017-03-30 ENCOUNTER — Ambulatory Visit (INDEPENDENT_AMBULATORY_CARE_PROVIDER_SITE_OTHER): Payer: Self-pay | Admitting: Orthopaedic Surgery

## 2017-04-10 ENCOUNTER — Ambulatory Visit
Admission: RE | Admit: 2017-04-10 | Discharge: 2017-04-10 | Disposition: A | Discharge status: Home / Self Care | Payer: Self-pay | Source: Ambulatory Visit | Attending: Surgery | Admitting: Surgery

## 2017-04-10 ENCOUNTER — Other Ambulatory Visit (INDEPENDENT_AMBULATORY_CARE_PROVIDER_SITE_OTHER): Payer: Self-pay | Admitting: Surgery

## 2017-04-10 ENCOUNTER — Ambulatory Visit
Admission: RE | Admit: 2017-04-10 | Discharge: 2017-04-10 | Disposition: A | Discharge status: Home / Self Care | Payer: Worker's Compensation | Source: Ambulatory Visit | Attending: Surgery | Admitting: Surgery

## 2017-04-10 VITALS — BP 108/67 | HR 82

## 2017-04-10 DIAGNOSIS — M545 Low back pain, unspecified: Secondary | ICD-10-CM

## 2017-04-10 DIAGNOSIS — M4807 Spinal stenosis, lumbosacral region: Secondary | ICD-10-CM

## 2017-04-10 DIAGNOSIS — G8929 Other chronic pain: Secondary | ICD-10-CM

## 2017-04-10 DIAGNOSIS — M5441 Lumbago with sciatica, right side: Principal | ICD-10-CM

## 2017-04-10 MED ORDER — IOPAMIDOL (ISOVUE-M 200) INJECTION 41%
15.0000 mL | Freq: Once | INTRAMUSCULAR | Status: AC
  Administered 2017-04-10: 15 mL via INTRATHECAL

## 2017-04-10 MED ORDER — DIAZEPAM 5 MG PO TABS
10.0000 mg | ORAL_TABLET | Freq: Once | ORAL | Status: AC
  Administered 2017-04-10: 10 mg via ORAL

## 2017-04-10 NOTE — Discharge Instructions (Signed)
Myelogram Discharge Instructions  1. Go home and rest quietly for the next 24 hours.  It is important to lie flat for the next 24 hours.  Get up only to go to the restroom.  You may lie in the bed or on a couch on your back, your stomach, your left side or your right side.  You may have one pillow under your head.  You may have pillows between your knees while you are on your side or under your knees while you are on your back.  2. DO NOT drive today.  Recline the seat as far back as it will go, while still wearing your seat belt, on the way home.  3. You may get up to go to the bathroom as needed.  You may sit up for 10 minutes to eat.  You may resume your normal diet and medications unless otherwise indicated.  Drink lots of extra fluids today and tomorrow.  4. The incidence of headache, nausea, or vomiting is about 5% (one in 20 patients).  If you develop a headache, lie flat and drink plenty of fluids until the headache goes away.  Caffeinated beverages may be helpful.  If you develop severe nausea and vomiting or a headache that does not go away with flat bed rest, call [phone].  5. You may resume normal activities after your 24 hours of bed rest is over; however, do not exert yourself strongly or do any heavy lifting tomorrow. If when you get up you have a headache when standing, go back to bed and force fluids for another 24 hours.  6. Call your physician for a follow-up appointment.  The results of your myelogram will be sent directly to your physician by the following day.  7. If you have any questions or if complications develop after you arrive home, please call [phone].  Discharge instructions have been explained to the patient.  The patient, or the person responsible for the patient, fully understands these instructions.  YOU MAY RESTART YOUR ZOLOFT AND IMITREX TOMORROW 04/11/2017 AT 9:30AM.

## 2017-04-10 NOTE — Progress Notes (Signed)
Patient states she has taken her 13-hour prep as prescribed.  She states she has been off Zoloft and Imitrex for at least the past two days.  Brita Romp, RN

## 2017-04-11 ENCOUNTER — Encounter (INDEPENDENT_AMBULATORY_CARE_PROVIDER_SITE_OTHER): Payer: Self-pay | Admitting: Orthopaedic Surgery

## 2017-04-13 ENCOUNTER — Other Ambulatory Visit: Payer: Self-pay | Admitting: Adult Health

## 2017-04-13 NOTE — Telephone Encounter (Signed)
Please review patient was last seen on 02/28/17 and has a follow up appointment on 05/02/17. MPulliam, CMA/RT(R)

## 2017-04-17 ENCOUNTER — Ambulatory Visit (INDEPENDENT_AMBULATORY_CARE_PROVIDER_SITE_OTHER): Payer: Worker's Compensation | Admitting: Orthopaedic Surgery

## 2017-04-17 ENCOUNTER — Telehealth (INDEPENDENT_AMBULATORY_CARE_PROVIDER_SITE_OTHER): Payer: Self-pay

## 2017-04-17 ENCOUNTER — Encounter (INDEPENDENT_AMBULATORY_CARE_PROVIDER_SITE_OTHER): Payer: Self-pay | Admitting: Orthopaedic Surgery

## 2017-04-17 ENCOUNTER — Telehealth (INDEPENDENT_AMBULATORY_CARE_PROVIDER_SITE_OTHER): Payer: Self-pay | Admitting: Orthopaedic Surgery

## 2017-04-17 VITALS — BP 134/79 | HR 97

## 2017-04-17 DIAGNOSIS — M4316 Spondylolisthesis, lumbar region: Secondary | ICD-10-CM

## 2017-04-17 DIAGNOSIS — M5416 Radiculopathy, lumbar region: Secondary | ICD-10-CM | POA: Diagnosis not present

## 2017-04-17 DIAGNOSIS — M5126 Other intervertebral disc displacement, lumbar region: Secondary | ICD-10-CM | POA: Diagnosis not present

## 2017-04-17 NOTE — Telephone Encounter (Signed)
error 

## 2017-04-17 NOTE — Telephone Encounter (Signed)
-----   Message from Marybelle Killings, MD sent at 04/17/2017  2:33 PM EST ----- Cc to rehab nurse fax number in dictation and Dr. Alcide Clever  in [place]

## 2017-04-17 NOTE — Progress Notes (Signed)
Office Visit Note   Patient: Kellie Jones           Date of Birth: 03-29-1960           MRN: [id number] Visit Date: 04/17/2017              Requested by: Mellody Dance, DO [place], [place] [address] PCP: Mellody Dance, DO   Assessment & Plan: Visit Diagnoses:  1. Protrusion of lumbar intervertebral disc   2. Radiculopathy, lumbar region   3. Spondylolisthesis, lumbar region           With instability shifting of 4 to 7 mm with flexion extension on myelogram.   Plan: Myelogram demonstrates with patient in upright position she has disc protrusion with weightbearing.  This was not present are noticeable on her MRI which is obtained when patient is laying down.  She has instability with degenerative facets and has shifting noted on her myelogram with flexion extension changing from 3-7 mm.  I discussed with the patient that she would require single level fusion due to the instability and disc protrusion.  She has disc protrusion which has progressed with instability in a single segment and should do well with a single level fusion.  Usual length of time out of work is in the 105-month range for the type of job that she is currently doing.  She would be in the hospital 1-2 nights.  She is quit smoking fortunately and would need pulmonary function test before her surgery and clearance from her pulmonary physician Dr.Chodri who she sees in [place].  Work slip given no work pending lumbar surgery and postoperative recovery.  Follow-Up Instructions: No Follow-up on file.   Orders:  No orders of the defined types were placed in this encounter.  No orders of the defined types were placed in this encounter.     Procedures: No procedures performed   Clinical Data: No additional findings.   Subjective: Chief Complaint  Patient presents with  . Lower Back - Pain, Follow-up    CT/Myelo Review    HPI a 57 year old female returns for follow-up with on-the-job  injury that occurred on 10/23/2016 when she had her knees locked up against a machine pulling on the cylinder which she normally does multiple times a day and had significant back pain leg pain.  She has been through a prednisone pack has had an MRI scan.  Plain radiographs showed some anterolisthesis at L4-5 which was 1-2 mm.  Due to persistent problems a lumbar myelogram CT scan has been performed on 04/10/2017 and is available for review.  She is here with Raynelle Dick RN nurse case manager is been assigned to her.  Fax [phone].  Patient has not been back to work since I last saw her on 01/09/2017.  Patient continues to have pain with bending twisting and stooping.  She does better when she is upright or when she is laying down.  She states she has increased pain when she is in the upright position after 10 minutes.  Due to her persistent symptoms and ability to resume work with scheduled and obtained a lumbar myelogram CT scan which we reviewed today.  Review of Systems 14 point review of systems updated positive for COPD.  Seasonal allergies.  History of anxiety depression.  She lives with her other family members.  She is worked for [employer] for about 9 years.  Negative for heart attack or stroke.  Otherwise negative as it pertains  HPI.  Patient quit smoking in November as requested.   Objective: Vital Signs: BP 134/79   Pulse 97   Physical Exam  Constitutional: She is oriented to person, place, and time. She appears well-developed.  HENT:  Head: Normocephalic.  Right Ear: External ear normal.  Left Ear: External ear normal.  Eyes: Pupils are equal, round, and reactive to light.  Neck: No tracheal deviation present. No thyromegaly present.  Cardiovascular: Normal rate.  Pulmonary/Chest: Effort normal.  Abdominal: Soft.  Neurological: She is alert and oriented to person, place, and time.  Skin: Skin is warm and dry.  Psychiatric: She has a normal mood and affect. Her behavior is  normal.    Ortho Exam patient slow getting from sitting standing she frequently just positions while in the office.  She has tenderness of the lumbosacral junction.  Reflexes are 3+ and symmetrical.  Range of motion.  No hip flexion quad weakness.  Limitation forward flexion to 50% due to pain.  Some pain with extension.  Specialty Comments:  No specialty comments available.  Imaging:CLINICAL DATA:  Low back pain.  RIGHT greater than LEFT leg pain.  EXAM: LUMBAR MYELOGRAM  FLUOROSCOPY TIME:  31 seconds corresponding to a Dose Area Product of 172.2 Gy*m2  PROCEDURE: After thorough discussion of risks and benefits of the procedure including bleeding, infection, injury to nerves, blood vessels, adjacent structures as well as headache and CSF leak, written and oral informed consent was obtained. Consent was obtained by Dr. Rolla Flatten. Time out form was completed.  Patient was positioned prone on the fluoroscopy table. Local anesthesia was provided with 1% lidocaine without epinephrine after prepped and draped in the usual sterile fashion. Puncture was performed at L3-L4 using a 3 1/2 inch 22-gauge spinal needle via midline approach. Using a single pass through the dura, the needle was placed within the thecal sac, with return of clear CSF. 15 mL of Isovue M-200 was injected into the thecal sac, with normal opacification of the nerve roots and cauda equina consistent with free flow within the subarachnoid space.  I personally performed the lumbar puncture and administered the intrathecal contrast. I also personally supervised acquisition of the myelogram images.  TECHNIQUE: Contiguous axial images were obtained through the Lumbar spine after the intrathecal infusion of infusion. Coronal and sagittal reconstructions were obtained of the axial image sets.  COMPARISON:  Outside MRI 11/16/2016.  FINDINGS: LUMBAR MYELOGRAM FINDINGS:  Good opacification lumbar  subarachnoid space. On prone spot films, there is subarticular zone narrowing on the RIGHT at L4-5, with effacement of the RIGHT L5 nerve root. No other areas of impingement are observed.  With patient standing, frank RIGHT L5 nerve root compression is observed on the AP radiograph, related to increasing subluxation at L4-5. 3 mm anterolisthesis at L4-5 prone, increases to 5 mm in upright neutral, 7 mm in flexion, and 6 mm in extension. In addition, 3 mm of anterolisthesis at L5-S1 develops, stable between flexion and extension.  CT LUMBAR MYELOGRAM FINDINGS:  Segmentation: Normal.  Alignment:  2 mm anterolisthesis L4-5 with patient recumbent for CT.  Vertebrae: No worrisome osseous lesion.Skeletal osteopenia.  Conus medullaris: Normal in size and location.  Termination at L1.  Paraspinal tissues: No evidence for hydronephrosis or paravertebral mass. Aortic atherosclerosis.  Disc levels:  L1-L2:  Normal.  L2-L3:  Normal.  L3-L4:  Normal disc space.  Facet arthropathy.  No impingement.  L4-L5: 2 mm anterolisthesis. Broad-based disc protrusion projects centrally into the ventral epidural space. Facet arthropathy  and ligamentum flavum hypertrophy contributes to subarticular zone narrowing. Borderline stenosis. BILATERAL L5 nerve root effacement. Disc material extends into both foramina, without clear-cut L4 nerve root impingement.  L5-S1: Unremarkable disc space. Facet arthropathy. No impingement.  IMPRESSION: LUMBAR MYELOGRAM IMPRESSION:  Dynamic instability at L4-5, with anterolisthesis increasing to a maximum of 7 mm with patient standing in flexion.  Asymmetric subarticular zone narrowing at L4-5 on the RIGHT, with RIGHT L5 nerve root impingement most apparent with patient standing on AP view.  CT LUMBAR MYELOGRAM IMPRESSION:  Central protrusion at L4-5. BILATERAL L5 nerve root effacement due to 2 mm slip, and posterior element hypertrophy.  Either L5 nerve root could be affected.   Electronically Signed   By: Staci Righter M.D.   On: 04/10/2017 11:24     PMFS History: Patient Active Problem List   Diagnosis Date Noted  . 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 Family history of breast cancer in first degree relative---> daughter- at age 51 08/17/2016  . Hyperalgesia- skin of feet.  07/17/2016  . Fibrocystic breast changes 07/17/2016  . GERD (gastroesophageal reflux disease) 01/19/2016  . Vitamin D deficiency 01/03/2016  . Tobacco use disorder: >30pk yr hx 01/03/2016  . Tobacco abuse counseling 01/03/2016  . Erythrocytosis due to hypoxemia 01/03/2016  . History of gastroesophageal reflux (GERD) 01/03/2016  . Environmental and seasonal allergies 01/03/2016  . Bilateral feet cramps- worse at night 12/11/2015  . Migraine 12/07/2015  . Hypothyroidism 12/07/2015  . COPD still smoking  12/07/2015  . Anxiety 12/07/2015  . Depression 12/07/2015  . Other fatigue 12/07/2015  . Absolute anemia 12/07/2015   Past Medical History:  Diagnosis Date  . Anxiety   . COPD (chronic obstructive pulmonary disease) ([place])   . Depression   . Hypothyroidism   . Migraine   . Neuropathic pain     Family History  Problem Relation Age of Onset  . Depression Mother   . Cancer Father 39       pancreatic  . Healthy Sister   . Healthy Brother   . Cancer Maternal Aunt 39       breast  . Cancer Maternal Grandmother 30       breast  . Healthy Brother   . Healthy Brother     Past Surgical History:  Procedure Laterality Date  . ABDOMINAL HYSTERECTOMY     total  . APPENDECTOMY    . CHOLECYSTECTOMY     Social History   Occupational History  . Not on file  Tobacco Use  . Smoking status: Former Smoker    Packs/day: 1.00    Years: 30.00    Pack years: 30.00    Last attempt to quit: 01/26/2017    Years since quitting: 0.2  . Smokeless tobacco: Never Used  Substance and Sexual  Activity  . Alcohol use: Yes    Comment: rarely  . Drug use: No  . Sexual activity: No

## 2017-04-17 NOTE — Telephone Encounter (Signed)
Emailed todays office note and work note to case mgr and asked her to advise when surgery is approved

## 2017-04-18 ENCOUNTER — Telehealth (INDEPENDENT_AMBULATORY_CARE_PROVIDER_SITE_OTHER): Payer: Self-pay | Admitting: Radiology

## 2017-04-18 MED ORDER — TRAMADOL HCL 50 MG PO TABS: 50.0000 mg | ORAL_TABLET | Freq: Two times a day (BID) | ORAL | 0 refills | Status: DC | PRN

## 2017-04-18 MED ORDER — CYCLOBENZAPRINE HCL 10 MG PO TABS: 10.0000 mg | ORAL_TABLET | Freq: Three times a day (TID) | ORAL | 0 refills | Status: DC | PRN

## 2017-04-18 NOTE — Telephone Encounter (Signed)
Patient requests refill on Tramadol and if she could possibly try muscle relaxer while waiting for surgery scheduling. Please advise.

## 2017-04-18 NOTE — Addendum Note (Signed)
Addended by: Meyer Cory on: 04/18/2017 04:08 PM   Modules accepted: Orders

## 2017-04-18 NOTE — Telephone Encounter (Signed)
Called both meds to pharmacy. I called patient and advise.d

## 2017-04-18 NOTE — Telephone Encounter (Signed)
OK refill ultram.  Flexeril 10 mg po q hs prn # 30 .

## 2017-04-29 ENCOUNTER — Encounter (INDEPENDENT_AMBULATORY_CARE_PROVIDER_SITE_OTHER): Payer: Self-pay | Admitting: Orthopaedic Surgery

## 2017-05-01 ENCOUNTER — Telehealth (INDEPENDENT_AMBULATORY_CARE_PROVIDER_SITE_OTHER): Payer: Self-pay | Admitting: Orthopaedic Surgery

## 2017-05-01 NOTE — Telephone Encounter (Signed)
FYI: called patient regarding the status on her clearance for surgery.  She was to have a pulmonary function test done, but she cancelled her appointment since she had not heard from workers comp authorizing the surgery. She plans on calling and rescheduling her appointment with the pulmonologist tomorrow. A clearance letter was sent to Dr. Alcide Clever in [place] on 04/19/17 .  WC requested the notes on 02/19. I will follow up with Amy tomorrow on [facility] authorization.

## 2017-05-02 ENCOUNTER — Ambulatory Visit: Payer: Managed Care, Other (non HMO) | Admitting: Family Medicine

## 2017-05-02 NOTE — Telephone Encounter (Signed)
noted 

## 2017-05-15 ENCOUNTER — Other Ambulatory Visit: Payer: Self-pay | Admitting: Family Medicine

## 2017-05-15 DIAGNOSIS — J3089 Other allergic rhinitis: Secondary | ICD-10-CM

## 2017-05-16 NOTE — Telephone Encounter (Signed)
Emailed Raynelle Dick asking for update

## 2017-05-18 ENCOUNTER — Encounter: Payer: Self-pay | Admitting: Family Medicine

## 2017-05-18 ENCOUNTER — Ambulatory Visit: Payer: Managed Care, Other (non HMO) | Admitting: Family Medicine

## 2017-05-18 VITALS — BP 140/71 | HR 92 | Ht 66.0 in | Wt 148.3 lb

## 2017-05-18 DIAGNOSIS — J449 Chronic obstructive pulmonary disease, unspecified: Secondary | ICD-10-CM

## 2017-05-18 DIAGNOSIS — F321 Major depressive disorder, single episode, moderate: Secondary | ICD-10-CM

## 2017-05-18 DIAGNOSIS — F419 Anxiety disorder, unspecified: Secondary | ICD-10-CM

## 2017-05-18 DIAGNOSIS — E039 Hypothyroidism, unspecified: Secondary | ICD-10-CM | POA: Diagnosis not present

## 2017-05-18 DIAGNOSIS — G43001 Migraine without aura, not intractable, with status migrainosus: Secondary | ICD-10-CM

## 2017-05-18 DIAGNOSIS — J3089 Other allergic rhinitis: Secondary | ICD-10-CM

## 2017-05-18 MED ORDER — SUMATRIPTAN SUCCINATE 100 MG PO TABS: 100.0000 mg | ORAL_TABLET | ORAL | 1 refills | Status: DC | PRN

## 2017-05-18 MED ORDER — SERTRALINE HCL 100 MG PO TABS: 200.0000 mg | ORAL_TABLET | Freq: Every day | ORAL | 1 refills | Status: DC

## 2017-05-18 MED ORDER — MONTELUKAST SODIUM 10 MG PO TABS: 10.0000 mg | ORAL_TABLET | Freq: Every day | ORAL | 1 refills | Status: DC

## 2017-05-18 MED ORDER — LEVOTHYROXINE SODIUM 150 MCG PO TABS: 150.0000 ug | ORAL_TABLET | Freq: Every day | ORAL | 1 refills | Status: DC

## 2017-05-18 MED ORDER — ALBUTEROL SULFATE HFA 108 (90 BASE) MCG/ACT IN AERS: 1.0000 | INHALATION_SPRAY | Freq: Four times a day (QID) | RESPIRATORY_TRACT | 0 refills | Status: DC | PRN

## 2017-05-18 NOTE — Progress Notes (Signed)
Impression and Recommendations:    1. COPD/ emphasema stopped smoking   2. Environmental and seasonal allergies   3. Hypothyroidism, unspecified type   4. Anxiety   5. Current moderate episode of major depressive disorder, unspecified whether recurrent ([place])   6. Migraine without aura and with status migrainosus, not intractable    1. Mood - 8 weeks ago increased sertraline from 150 to 200. - Per patient, mood is doing very well right now.  - Will continue her Zoloft at the current dose.  2. Pulmonology in [place] - COPD (stopped smoking) - Patient knows to continue breathing follow-up through Dr. Alcide Clever with Pulmonology.  - All breathing medicines, such as albuterol and Symbicort, will be refilled by Pulmonology in the future.  - Patient knows that her breathing needs specialty care beyond what we can provide here as PCP.  - Patient returns to see pulmonology next week.  Discussed and advised patient to ask pulmonology to assume follow-up with her low-dose CT lung screenings - reviewed that pulmonology would best be in charge of this as they monitor her COPD.  Patient will bring her documentation to next appointment.  3. Migraines - Per patient, migraines have worsened since starting medications prescribed by Dr. Lorin Mercy.  Particularly notes that tramadol is a migraine trigger.  - Patient is taking Imitrex about twice a month and knows that this should be taken as sparingly as possible.  4. Thyroid - Thyroid checked in October and looked great.  5. Last Lab Work - Last lab work done in October - looked good.  6. General Health Maintenance - Advised patient to be careful about weight gain because of her bad COPD. - Patient knows to watch allergies, weight; anything that could affect her breathing.  - Advised the patient to begin using AYR or Neilmed sinus rinses BID followed by flonase BID (one spray to each nostril).  Advised that the patient may also incorporate  allegra or claritin PRN.   - Advised patient to continue working toward exercising to improve health.  Patient should be careful with her breathing, but still recommended that the patient eventually strive for at least 150 minutes of cardio per week according to guidelines established by the [facility].   - Healthy dietary habits encouraged, including low-carb, and high amounts of lean protein in diet.   - Patient should also consume adequate amounts of water - half of body weight in oz of water per day.  7. Follow-Up - Return in 3-4 months, or when desired.   Education and routine counseling performed. Handouts provided.  No orders of the defined types were placed in this encounter.   Meds ordered this encounter  Medications  . levothyroxine (SYNTHROID, LEVOTHROID) 150 MCG tablet    Sig: Take 1 tablet (150 mcg total) by mouth daily.    Dispense:  90 tablet    Refill:  1  . montelukast (SINGULAIR) 10 MG tablet    Sig: Take 1 tablet (10 mg total) by mouth at bedtime.    Dispense:  90 tablet    Refill:  1  . sertraline (ZOLOFT) 100 MG tablet    Sig: Take 2 tablets (200 mg total) by mouth at bedtime.    Dispense:  180 tablet    Refill:  1  . SUMAtriptan (IMITREX) 100 MG tablet    Sig: Take 1 tablet (100 mg total) by mouth as needed. Take along with 400mg  ibuprofen at onset of migraine  Dispense:  10 tablet    Refill:  1  . albuterol (PROVENTIL HFA;VENTOLIN HFA) 108 (90 Base) MCG/ACT inhaler    Sig: Inhale 1-2 puffs into the lungs every 6 (six) hours as needed. For wheezing    Dispense:  1 Inhaler    Refill:  0    Further RF's per pt's Pulmonologist- Dr Alcide Clever in [place], [place]    Gross side effects, risk and benefits, and alternatives of medications and treatment plan in general discussed with patient.  Patient is aware that all medications have potential side effects and we are unable to predict every side effect or drug-drug interaction that may occur.   Patient will call with any  questions prior to using medication if they have concerns.  Expresses verbal understanding and consents to current therapy and treatment regimen.  No barriers to understanding were identified.  Red flag symptoms and signs discussed in detail.  Patient expressed understanding regarding what to do in case of emergency\urgent symptoms  Please see AVS handed out to patient at the end of our visit for further patient instructions/ counseling done pertaining to today's office visit.   Return in about 3 months (around 08/18/2017).     Note: This document was prepared using Dragon voice recognition software and may include unintentional dictation errors.  This document serves as a record of services personally performed by Mellody Dance, DO. It was created on her behalf by Toni Amend, a trained medical scribe. The creation of this record is based on the scribe's personal observations and the provider's statements to them.   I have reviewed the above medical documentation for accuracy and completeness and I concur.  Mellody Dance 05/18/17 12:38 PM   ---------------------------------------------------------------------------------------------------------------------------------------------------------------    Subjective:    CC:  Chief Complaint  Patient presents with  . Follow-up    HPI: Kellie Jones is a 57 y.o. female who presents to [place] at [facility] today for follow-up of mood.   Gained weight from 124 lbs to 148 lbs.  Is still not smoking.  Mood 8 weeks ago increased sertraline from 150 to 200.  Notes that she's happy and everything is funny to her.  Nothing bothers her anymore.  "I'm driving my family crazy 'cause nothing bothers me."  Pulmonology Went to her pulmonology referral in [place] and liked her doctor - Dr. Alcide Clever.  She was given nebulizers to use 2-4 times per day.  Is currently using Symbicort.  Patient returns to pulmonology  next week.  Back Pain Had a CT scan of her back - "it was all bad news."  Notes that her back was "all jacked up."  Dr. Lorin Mercy recommended surgery and, per patient, put in a metal rod to stabilize her spine.  Dr. Lorin Mercy will not perform the surgery until her pulmonary doctor approves it.    Patient notes that she cannot pay for her needed pulmonary function test, so everything is on hold for now.  Migraines Notes that her migraines have worsened since starting medications prescribed by Dr. Lorin Mercy.  Particularly notes that tramadol is a migraine trigger.  Taking Imitrex about twice a month.   Depression screen [facility] 2/9 05/18/2017 02/28/2017 12/18/2016  Decreased Interest 0 3 1  Down, Depressed, Hopeless 0 0 1  PHQ - 2 Score 0 3 2  Altered sleeping 0 3 -  Tired, decreased energy 0 2 2  Change in appetite 0 3 2  Feeling bad or failure about yourself  0 0  0  Trouble concentrating 0 2 0  Moving slowly or fidgety/restless 0 3 -  Suicidal thoughts 0 0 0  PHQ-9 Score 0 16 -  Difficult doing work/chores Not difficult at all Very difficult -     GAD 7 : Generalized Anxiety Score 05/18/2017 12/18/2016 08/17/2016 07/17/2016  Nervous, Anxious, on Edge 0 1 1 2   Control/stop worrying 0 1 0 0  Worry too much - different things 0 0 1 0  Trouble relaxing 0 2 0 0  Restless 0 1 0 0  Easily annoyed or irritable 0 1 1 2   Afraid - awful might happen 0 0 0 0  Total GAD 7 Score 0 6 3 4   Anxiety Difficulty Not difficult at all - Not difficult at all Somewhat difficult     Wt Readings from Last 3 Encounters:  05/18/17 148 lb 4.8 oz (67.3 kg)  02/28/17 133 lb 11.2 oz (60.6 kg)  02/14/17 130 lb (59 kg)   BP Readings from Last 3 Encounters:  05/18/17 140/71  04/17/17 134/79  04/10/17 108/67   Pulse Readings from Last 3 Encounters:  05/18/17 92  04/17/17 97  04/10/17 82   BMI Readings from Last 3 Encounters:  05/18/17 23.94 kg/m  02/28/17 21.58 kg/m  02/14/17 20.98 kg/m     Patient Care  Team    Relationship Specialty Notifications Start End  Mellody Dance, DO PCP - General Family Medicine  12/07/15   Gardiner Rhyme, MD Referring Physician Pulmonary Disease  12/08/15   Marybelle Killings, MD Consulting Physician Orthopedic Surgery  02/28/17    Comment: txs her back- work related injury     Patient Active Problem List   Diagnosis Date Noted  . Family history of breast cancer in first degree relative---> daughter- at age 76 08/17/2016    Priority: High  . Hyperalgesia- skin of feet.  07/17/2016    Priority: High  . Tobacco use disorder: >30pk yr hx 01/03/2016    Priority: High  . Hypothyroidism 12/07/2015    Priority: High  . COPD still smoking  12/07/2015    Priority: High  . Vitamin D deficiency 01/03/2016    Priority: Medium  . Environmental and seasonal allergies 01/03/2016    Priority: Medium  . Other fatigue 12/07/2015    Priority: Medium  . GERD (gastroesophageal reflux disease) 01/19/2016    Priority: Low  . Tobacco abuse counseling 01/03/2016    Priority: Low  . Bilateral feet cramps- worse at night 12/11/2015    Priority: Low  . Anxiety 12/07/2015    Priority: Low  . Depression 12/07/2015    Priority: Low  . 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 Fibrocystic breast changes 07/17/2016  . Erythrocytosis due to hypoxemia 01/03/2016  . History of gastroesophageal reflux (GERD) 01/03/2016  . Migraine 12/07/2015  . Absolute anemia 12/07/2015    Past Medical history, Surgical history, Family history, Social history, Allergies and Medications have been entered into the medical record, reviewed and changed as needed.    Current Meds  Medication Sig  . albuterol (PROVENTIL HFA;VENTOLIN HFA) 108 (90 Base) MCG/ACT inhaler Inhale 1-2 puffs into the lungs every 6 (six) hours as needed. For wheezing  . Aspirin-Salicylamide-Caffeine (BC HEADACHE) 325-95-16 MG TABS Take 1 packet by mouth 3 (three) times daily as  needed. For headache  . B Complex-C (B-COMPLEX WITH VITAMIN C) tablet Take 1 tablet by mouth daily.  . budesonide-formoterol (SYMBICORT) 160-4.5 MCG/ACT inhaler Inhale 2  puffs into the lungs 2 (two) times daily.  . Cholecalciferol (VITAMIN D3) 5000 units CAPS Take 1 capsule by mouth daily.  . cyclobenzaprine (FLEXERIL) 10 MG tablet Take 1 tablet (10 mg total) by mouth 3 (three) times daily as needed for muscle spasms.  Marland Kitchen ibuprofen (ADVIL,MOTRIN) 200 MG tablet Take 400 mg by mouth every 6 (six) hours as needed. For joint pain  . levothyroxine (SYNTHROID, LEVOTHROID) 150 MCG tablet Take 1 tablet (150 mcg total) by mouth daily.  . metaxalone (SKELAXIN) 800 MG tablet Take 800 mg by mouth. One tablet 3-4 times daily as needed  . montelukast (SINGULAIR) 10 MG tablet Take 1 tablet (10 mg total) by mouth at bedtime.  Marland Kitchen omeprazole (PRILOSEC) 20 MG capsule Take 1 capsule (20 mg total) by mouth 2 (two) times daily before a meal.  . sertraline (ZOLOFT) 100 MG tablet Take 2 tablets (200 mg total) by mouth at bedtime.  . SUMAtriptan (IMITREX) 100 MG tablet Take 1 tablet (100 mg total) by mouth as needed. Take along with 400mg  ibuprofen at onset of migraine  . traMADol (ULTRAM) 50 MG tablet Take 1 tablet (50 mg total) by mouth 2 (two) times daily as needed.  . [DISCONTINUED] albuterol (PROVENTIL HFA;VENTOLIN HFA) 108 (90 Base) MCG/ACT inhaler Inhale 1-2 puffs into the lungs every 6 (six) hours as needed. For wheezing  . [DISCONTINUED] levothyroxine (SYNTHROID, LEVOTHROID) 150 MCG tablet TAKE 1 TABLET BY MOUTH DAILY.  . [DISCONTINUED] montelukast (SINGULAIR) 10 MG tablet TAKE 1 TABLET BY MOUTH AT BEDTIME.  . [DISCONTINUED] sertraline (ZOLOFT) 100 MG tablet Take 2 tablets (200 mg total) by mouth at bedtime.  . [DISCONTINUED] SUMAtriptan (IMITREX) 100 MG tablet Take 1 tablet (100 mg total) by mouth as needed. Take along with 400mg  ibuprofen at onset of migraine    Allergies:  Allergies  Allergen Reactions  .  Iodinated Diagnostic Agents Swelling and Other (See Comments)    Sneezing and eye swelling  . Codeine Rash     Review of Systems: Review of Systems: General:   No F/C, wt loss Pulm:   No DIB, SOB, pleuritic chest pain Card:  No CP, palpitations Abd:  No n/v/d or pain Ext:  No inc edema from baseline Psych: no SI/ HI    Objective:   Blood pressure 140/71, pulse 92, height 5\' 6"  (1.676 m), weight 148 lb 4.8 oz (67.3 kg), SpO2 96 %. Body mass index is 23.94 kg/m. General:  Well Developed, well nourished, appropriate for stated age.  Neuro:  Alert and oriented,  extra-ocular muscles intact  HEENT:  Normocephalic, atraumatic, neck supple, no carotid bruits appreciated  Skin:  no gross rash, warm, pink. Cardiac:  RRR, S1 S2 Respiratory:  Globally decreased aeration with expiratory wheezes B/L and A/P, Not using accessory muscles, speaking in full sentences- unlabored. Vascular:  Ext warm, no cyanosis apprec.; cap RF less 2 sec. Psych:  No HI/SI, judgement and insight good, Euthymic mood. Full Affect.

## 2017-05-21 ENCOUNTER — Encounter (INDEPENDENT_AMBULATORY_CARE_PROVIDER_SITE_OTHER): Payer: Self-pay | Admitting: Orthopaedic Surgery

## 2017-05-22 ENCOUNTER — Encounter (INDEPENDENT_AMBULATORY_CARE_PROVIDER_SITE_OTHER): Payer: Self-pay | Admitting: Orthopaedic Surgery

## 2017-06-22 NOTE — Telephone Encounter (Signed)
Left vm for Raynelle Dick, NCM to return my call re surgery auth. Emailed her as well. Also left vm for adj Annalynne Ibanez.

## 2017-07-20 ENCOUNTER — Encounter (INDEPENDENT_AMBULATORY_CARE_PROVIDER_SITE_OTHER): Payer: Self-pay | Admitting: Orthopaedic Surgery

## 2017-07-24 ENCOUNTER — Encounter: Payer: Self-pay | Admitting: Family Medicine

## 2017-07-24 ENCOUNTER — Other Ambulatory Visit: Payer: Self-pay

## 2017-07-24 MED ORDER — VARENICLINE TARTRATE 0.5 MG X 11 & 1 MG X 42 PO MISC: ORAL | 0 refills | Status: DC

## 2017-07-24 MED ORDER — VARENICLINE TARTRATE 1 MG PO TABS: 1.0000 mg | ORAL_TABLET | Freq: Two times a day (BID) | ORAL | 3 refills | Status: DC

## 2017-07-24 NOTE — Progress Notes (Signed)
Patient notified office that she would like to start the Chantix at this time to help with quitting smoking. RX for starting and continuing pack sent to the pharmacy.  Patient notified. MPulliam, CMA/RT(R)

## 2017-09-21 ENCOUNTER — Encounter (INDEPENDENT_AMBULATORY_CARE_PROVIDER_SITE_OTHER): Payer: Self-pay | Admitting: Orthopaedic Surgery

## 2017-09-24 ENCOUNTER — Ambulatory Visit: Payer: Managed Care, Other (non HMO) | Admitting: Family Medicine

## 2017-09-24 ENCOUNTER — Encounter: Payer: Self-pay | Admitting: Family Medicine

## 2017-09-24 VITALS — BP 131/78 | HR 74 | Ht 66.0 in | Wt 153.7 lb

## 2017-09-24 DIAGNOSIS — Z5320 Procedure and treatment not carried out because of patient's decision for unspecified reasons: Secondary | ICD-10-CM | POA: Insufficient documentation

## 2017-09-24 DIAGNOSIS — F321 Major depressive disorder, single episode, moderate: Secondary | ICD-10-CM | POA: Diagnosis not present

## 2017-09-24 DIAGNOSIS — R208 Other disturbances of skin sensation: Secondary | ICD-10-CM

## 2017-09-24 DIAGNOSIS — J449 Chronic obstructive pulmonary disease, unspecified: Secondary | ICD-10-CM

## 2017-09-24 DIAGNOSIS — M549 Dorsalgia, unspecified: Secondary | ICD-10-CM

## 2017-09-24 DIAGNOSIS — F1010 Alcohol abuse, uncomplicated: Secondary | ICD-10-CM

## 2017-09-24 DIAGNOSIS — F17200 Nicotine dependence, unspecified, uncomplicated: Secondary | ICD-10-CM | POA: Diagnosis not present

## 2017-09-24 DIAGNOSIS — Z716 Tobacco abuse counseling: Secondary | ICD-10-CM

## 2017-09-24 DIAGNOSIS — E039 Hypothyroidism, unspecified: Secondary | ICD-10-CM | POA: Diagnosis not present

## 2017-09-24 DIAGNOSIS — E559 Vitamin D deficiency, unspecified: Secondary | ICD-10-CM

## 2017-09-24 DIAGNOSIS — F419 Anxiety disorder, unspecified: Secondary | ICD-10-CM

## 2017-09-24 DIAGNOSIS — G8929 Other chronic pain: Secondary | ICD-10-CM

## 2017-09-24 MED ORDER — LEVOTHYROXINE SODIUM 150 MCG PO TABS: 150.0000 ug | ORAL_TABLET | Freq: Every day | ORAL | 1 refills | Status: DC

## 2017-09-24 MED ORDER — DULOXETINE HCL 20 MG PO CPEP: ORAL_CAPSULE | ORAL | 0 refills | Status: DC

## 2017-09-24 MED ORDER — SERTRALINE HCL 100 MG PO TABS: 100.0000 mg | ORAL_TABLET | Freq: Every day | ORAL | 1 refills | Status: DC

## 2017-09-24 NOTE — Progress Notes (Signed)
Impression and Recommendations:    1. Current moderate episode of major depressive disorder, unspecified whether recurrent ([place])   2. Hypothyroidism, unspecified type   3. COPD/ emphasema stopped smoking   4. Tobacco use disorder: >30pk yr hx   5. Tobacco abuse counseling   6. Anxiety   7. Chronic back pain greater than 3 months duration   8. Vitamin D deficiency   9. Hyperalgesia- skin of feet.    10. Excessive drinking alcohol   11. Mammogram declined despite multiple attempts to get patient to go especially since her daughter with premature breast cancer     Mood -Counseled patient on starting cymbalta to help balance her mood and may help with her chronic pain  -Discussed plan to decrease Zoloft to 100mg   -Encouraged patient to seek out counseling and recommended therapists for stress and mood management.  Asked CMA to provide list to pt. -Requested patient to plan a follow-up in 6-8 weeks, or to notify immediately with red flag symptoms   COPD -Told pt to think seriously about quitting smoking!  Told pt it is very important for his/her health and well being.    -Smoking cessation instruction/counseling given:  counseled patient on the dangers of tobacco use, advised patient to stop smoking, and reviewed strategies to maximize success  -Discussed with patient that there are multiple treatments to aid in quitting smoking, however I explained none will work unless pt really want to quit  -Told to call 1-800-QUIT-NOW 276-834-9304) for free smoking cessation counseling and support, or pt can go online to www.heart.org - the American Heart Association website and search "quit smoking ".    Back Pain -Encouraged patient to cycle heat and cold to possibly alleviate some back pain -Patient receiving treatment with other physician   Hypothyroidism -Encouraged patient to return for thyroid testing in October   Vitamin D deficiency -Encouraged patient to begin taking  Vitamin D medication regularly   Hyperalgesia -Patient being treated for hyperalgesia by other physician  [place] patient on importance of yearly mammogram especially with familial history of breast cancer   Meds ordered this encounter  Medications  . levothyroxine (SYNTHROID, LEVOTHROID) 150 MCG tablet    Sig: Take 1 tablet (150 mcg total) by mouth daily.    Dispense:  90 tablet    Refill:  1  . sertraline (ZOLOFT) 100 MG tablet    Sig: Take 1 tablet (100 mg total) by mouth at bedtime.    Dispense:  90 tablet    Refill:  1  . DULoxetine (CYMBALTA) 20 MG capsule    Sig: 20 mg daily x2 weeks then go to 2 tabs daily    Dispense:  180 capsule    Refill:  0    Medications Discontinued During This Encounter  Medication Reason  . budesonide-formoterol (SYMBICORT) 160-4.5 MCG/ACT inhaler Change in therapy  . cyclobenzaprine (FLEXERIL) 10 MG tablet No longer needed (for PRN medications)  . montelukast (SINGULAIR) 10 MG tablet No longer needed (for PRN medications)  . omeprazole (PRILOSEC) 20 MG capsule Patient Preference  . traMADol (ULTRAM) 50 MG tablet Completed Course  . varenicline (CHANTIX CONTINUING MONTH PAK) 1 MG tablet Patient Preference  . varenicline (CHANTIX STARTING MONTH PAK) 0.5 MG X 11 & 1 MG X 42 tablet Patient Preference  . levothyroxine (SYNTHROID, LEVOTHROID) 150 MCG tablet Reorder  . sertraline (ZOLOFT) 100 MG tablet      Gross side effects, risk and benefits, and alternatives of medications  and treatment plan in general discussed with patient.  Patient is aware that all medications have potential side effects and we are unable to predict every side effect or drug-drug interaction that may occur.   Patient will call with any questions prior to using medication if they have concerns.  Expresses verbal understanding and consents to current therapy and treatment regimen.  No barriers to understanding were identified.  Red flag symptoms and signs discussed  in detail.  Patient expressed understanding regarding what to do in case of emergency\urgent symptoms  Please see AVS handed out to patient at the end of our visit for further patient instructions/ counseling done pertaining to today's office visit.   Return for 6-8 weeks after starting Cymbalta for mood.    Note:  This note was prepared with assistance of Dragon voice recognition software. Occasional wrong-word or sound-a-like substitutions may have occurred due to the inherent limitations of voice recognition software.    I have reviewed the above documentation for accuracy and completeness, and I agree with the above.    This document serves as a record of services personally performed by Mellody Dance, MD. It was created on her behalf by Georga Bora, a trained medical scribe. The creation of this record is based on the scribe's personal observations and the provider's statements to them.   I have reviewed the above medical documentation for accuracy and completeness and I concur.  Mellody Dance 09/24/17 11:08 AM   --------------------------------------------------------------------------------------------------------------------------------------------------------------------------------------------------------------------------------------------    Subjective:     HPI: Kellie Jones is a 57 y.o. female who presents to [place] [facility] today for issues as discussed below.  COPD -Conversational Dyspnea -Patient is taking the sample pack of Trilogy but hasn't noticed any changes -States she has a refill of chantix and does intend to quit smoking -Smoking roughly 1 pack every three days  Back Pain -Patient states she has fallen three times due to back issues; is switching back doctor from Dr. Lorin Mercy  -She does drink; begins at 830am and drinks one every few hours. Each day 4 beers per day 2 servings of tequila per day  -Patient has been drinking  to alleviate back pain  Lifestyle -Patient states she has not been taking her Vitamin D supplements -Weight gain from 148 in October to 153 today  Hyperalgesia -Has been taking medication for muscle spasms in feet  Thyroid -Patient states she has been compliant with thyroid medication  Cholesterol -LDL decreased to 102 In Oct 2018 from 103 Oct 2017 -HDL decreased to 61 in Oct 2018 from 64 in Oct 2017  Mood -Has been taking zoloft each night regularly  -States she hasn't noticed positive changes  -Has reservations about changing medications due to previous negative experiences when stopping medication -States she believes her constant pain is causing her issues with mood  -Patient states she is constantly tired and could "sleep all day and night"    Wt Readings from Last 3 Encounters:  09/24/17 153 lb 11.2 oz (69.7 kg)  05/18/17 148 lb 4.8 oz (67.3 kg)  02/28/17 133 lb 11.2 oz (60.6 kg)   BP Readings from Last 3 Encounters:  09/24/17 131/78  05/18/17 140/71  04/17/17 134/79   Pulse Readings from Last 3 Encounters:  09/24/17 74  05/18/17 92  04/17/17 97   BMI Readings from Last 3 Encounters:  09/24/17 24.81 kg/m  05/18/17 23.94 kg/m  02/28/17 21.58 kg/m     Patient Care Team  Relationship Specialty Notifications Start End  Mellody Dance, DO PCP - General Family Medicine  12/07/15   Gardiner Rhyme, MD Referring Physician Pulmonary Disease  12/08/15   Marybelle Killings, MD Consulting Physician Orthopedic Surgery  02/28/17    Comment: txs her back- work related injury     Patient Active Problem List   Diagnosis Date Noted  . Excessive drinking alcohol 09/24/2017    Priority: High  . Mammogram declined despite multiple attempts to get patient to go especially since her daughter with premature breast cancer 09/24/2017    Priority: High  . 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 Family history of  breast cancer in first degree relative---> daughter- at age 15 08/17/2016    Priority: High  . Hyperalgesia- skin of feet.  07/17/2016    Priority: High  . Tobacco use disorder: >30pk yr hx 01/03/2016    Priority: High  . Hypothyroidism 12/07/2015    Priority: High  . COPD still smoking  12/07/2015    Priority: High  . Vitamin D deficiency 01/03/2016    Priority: Medium  . Environmental and seasonal allergies 01/03/2016    Priority: Medium  . Other fatigue 12/07/2015    Priority: Medium  . GERD (gastroesophageal reflux disease) 01/19/2016    Priority: Low  . Tobacco abuse counseling 01/03/2016    Priority: Low  . Bilateral feet cramps- worse at night 12/11/2015    Priority: Low  . Anxiety 12/07/2015    Priority: Low  . Depression 12/07/2015    Priority: Low  . Chronic back pain greater than 3 months duration 09/24/2017  . Fibrocystic breast changes 07/17/2016  . Erythrocytosis due to hypoxemia 01/03/2016  . History of gastroesophageal reflux (GERD) 01/03/2016  . Migraine 12/07/2015  . Absolute anemia 12/07/2015    Past Medical history, Surgical history, Family history, Social history, Allergies and Medications have been entered into the medical record, reviewed and changed as needed.    Current Meds  Medication Sig  . albuterol (PROVENTIL HFA;VENTOLIN HFA) 108 (90 Base) MCG/ACT inhaler Inhale 1-2 puffs into the lungs every 6 (six) hours as needed. For wheezing  . Aspirin-Salicylamide-Caffeine (BC HEADACHE) 325-95-16 MG TABS Take 1 packet by mouth 3 (three) times daily as needed. For headache  . B Complex-C (B-COMPLEX WITH VITAMIN C) tablet Take 1 tablet by mouth daily.  . Cholecalciferol (VITAMIN D3) 5000 units CAPS Take 1 capsule by mouth daily.  Marland Kitchen ibuprofen (ADVIL,MOTRIN) 200 MG tablet Take 400 mg by mouth every 6 (six) hours as needed. For joint pain  . levothyroxine (SYNTHROID, LEVOTHROID) 150 MCG tablet Take 1 tablet (150 mcg total) by mouth daily.  . metaxalone  (SKELAXIN) 800 MG tablet Take 800 mg by mouth. One tablet 3-4 times daily as needed  . sertraline (ZOLOFT) 100 MG tablet Take 1 tablet (100 mg total) by mouth at bedtime.  . SUMAtriptan (IMITREX) 100 MG tablet Take 1 tablet (100 mg total) by mouth as needed. Take along with 400mg  ibuprofen at onset of migraine  . [DISCONTINUED] levothyroxine (SYNTHROID, LEVOTHROID) 150 MCG tablet Take 1 tablet (150 mcg total) by mouth daily.  . [DISCONTINUED] sertraline (ZOLOFT) 100 MG tablet Take 2 tablets (200 mg total) by mouth at bedtime.    Allergies:  Allergies  Allergen Reactions  . Iodinated Diagnostic Agents Swelling and Other (See Comments)    Sneezing and eye swelling  . Other Anaphylaxis    Contrast media dye  .  Codeine Rash     Review of Systems:  A fourteen system review of systems was performed and found to be positive as per HPI.   Objective:   Blood pressure 131/78, pulse 74, height 5\' 6"  (1.676 m), weight 153 lb 11.2 oz (69.7 kg), SpO2 98 %. Body mass index is 24.81 kg/m. General:  Well Developed, well nourished, appropriate for stated age.  Neuro:  Alert and oriented,  extra-ocular muscles intact  HEENT:  Normocephalic, atraumatic, neck supple, no carotid bruits appreciated  Skin:  no gross rash, warm, pink. Cardiac:  RRR, S1 S2 Respiratory:  ECTA B/L and A/P, Not using accessory muscles. Conversational dyspnea. -Decreased aeration globally with increased exhalation phase Vascular:  Ext warm, no cyanosis apprec.; cap RF less 2 sec. Psych:  No HI/SI, judgement and insight good, Euthymic mood. Full Affect.

## 2017-09-24 NOTE — Patient Instructions (Addendum)
Melissa please encourage patient to go for yearly mammograms - please put in order if patient needs another one and is not establish at a particular location.  Please start the Cymbalta as written on your prescription and also use the sertraline\Zoloft as written on your prescription.  Please follow-up in 6 to 8 weeks for recheck of mood after starting this new medicine  In October is when you are due again Ms. Kellie Jones for fasting blood work and we will recheck everything then.  Please make sure you are taking all your medicines as written and especially vitamin D etc.   Please think seriously about quitting smoking!  This is very important for your health and well being.   Smoking cessation instruction/counseling given:  counseled patient on the dangers of tobacco use, advised patient to stop smoking, and reviewed strategies to maximize success  Discussed with patient that there are multiple treatments to aid in quitting smoking, however I explained none will work unless pt really wants to quit  Please let us know in the future if you are interested and ready to quit.  You can also call 1-800-QUIT-NOW 619-875-7615) for free smoking cessation counseling and support.     Also, please go online to www.heart.org (the American Heart Association website) and search "quit smoking ".     Or try the centers for disease control website at: https://www.schmidt.com/  Or, go to the "national cancer institute" web site of NIH:  http://benson.com/  There is a lot of great information on these websites for you to look over.      Want to Quit Smoking? FDA-Approved Products Can Help  Quitting smoking can be hard, but it is possible. In fact, every time you put out a cigarette is a new chance to try quitting again, according to the U.S. Food and Drug Administration's newest tobacco education campaign, "Every Try  Counts."   If you want to quit-almost 70 percent of adult smokers say they do-you may want to use a "smoking cessation" product proven to help. Data has shown that using FDA-approved cessation medicine can double your chance of quitting successfully.  Some products contain nicotine as an active ingredient and others do not. These products include over-the-counter (OTC) options like skin patches, lozenges, and gum, as well as prescription medicines.  Smoking cessation products are intended to help you quit smoking. They are regulated through the [facility] Center for Drug Evaluation and Research, which ensures that the products are safe and effective and that their benefits outweigh any known associated risks.  The Benefits of Quitting Smoking No matter how much you smoke-or for how long-quitting will benefit you.  Not only will you lower your risk of getting various cancers, including lung cancer, you'll also reduce your chances of having heart disease, a stroke, emphysema, and other serious diseases. Quitting also will lower the risk of heart disease and lung cancer in nonsmokers who otherwise would be exposed to your secondhand smoke.  Although there are benefits to quitting at any age, it is important to quit as soon as possible so your body can begin to heal from the damage caused by smoking. For instance, 12 hours after you quit smoking the carbon monoxide level in your blood drops to normal. Carbon monoxide is harmful because it displaces oxygen in the blood and deprives your heart, brain, and other vital organs of oxygen.  What To Know About Smoking Cessation Products Understanding how smoking cessation products work-and what side effects they may cause-can help  you determine which product may be best for you.  If you're considering one of these products, reading labels and talking to your pharmacist and other health care providers are good first steps to take.  You also can check the FDA's  website for more information on each product at Drugs@FDA , where you can search for each product by name.  And remember to weigh each product's benefits and risks, among other considerations.  About Nicotine Replacement Therapy (NRT) Nicotine is the substance primarily responsible for causing addiction to tobacco products. Tobacco users who are addicted to nicotine are used to having nicotine in their bodies.  As you try to quit smoking, you may have symptoms of nicotine withdrawal. When you quit, this withdrawal may cause symptoms like cravings, or urges, to smoke; depression; trouble sleeping; irritability; anxiety; and increased appetite.  Nicotine withdrawal can discourage some smokers from continuing with a quit attempt. But the FDA has approved several smoking cessation products designed to help users gradually withdraw from smoking (that is, "wean" themselves from smoking) by using specific amounts of nicotine that decrease over time. This type of product is called a "nicotine replacement therapy" or NRT. It supplies nicotine in controlled amounts while sparing you from other chemicals found in tobacco products.  NRTs are available over the counter and by prescription. You should generally use them only for a short time to help you manage nicotine cravings and withdrawal. However, the FDA recognizes that some people may need to use these products longer to stay smoke-free. Talk to your health care provider to determine the best course of treatment for you.  Over-the-counter NRTs are approved for sale to people age 21 and older. They are available under various brand names and sometimes as generic products. They include:  - Skin patches (also called "transdermal nicotine patches"). These patches are placed on the skin, similar to how you would apply an adhesive bandage. - Chewing gum (also called "nicotine gum"). This gum must be chewed according to the labeled instructions to be effective. -  Lozenges (also called "nicotine lozenges"). You use these products by dissolving them in your mouth. For over-the-counter products, it's important to follow the instructions on the Drug Facts Label (DFL) and to read the enclosed User's Guide for complete directions and other important information. Ask your health care provider if you have questions.  Currently, prescription nicotine replacement therapy is available only under the brand name Nicotrol, and is available both as a nasal spray and an oral inhaler. The products are FDA-approved only for use by adults.  If you are under age 34 and want to quit smoking, talk to a health care professional about whether you should use nicotine replacement therapies.  Important Advice for People Considering Nicotine Replacement Therapy Women who are pregnant or breastfeeding should talk to their health care providers and use nicotine replacement products only if the health care providers approve.  Also talk to your health care provider before using these products if you have:  diabetes, heart disease, asthma, or stomach ulcers; had a recent heart attack; high blood pressure that is not controlled with medicine; a history of irregular heartbeat; or been prescribed medication to help you quit smoking. If you take prescription medication for depression or asthma, tell your health care provider if you are quitting smoking because he or she may need to change your prescription dose.  Stop using a nicotine replacement product and call your health care professional if you have any of the following  symptoms: nausea; dizziness; weakness; vomiting; fast or irregular heartbeat; mouth problems with the lozenge or gum; or redness or swelling of the skin around the patch that does not go away.  About Prescription Cessation Medicines Without Nicotine  The FDA has approved two smoking cessation products that do not contain nicotine. They are Chantix (varenicline  tartrate) and Zyban (buproprion hydrochloride). Both are available in tablet form and by prescription only.  Chantix acts at sites in the brain affected by nicotine by reducing the rewarding effects of nicotine. The precise way that Zyban helps with smoking cessation is unknown.  As with other prescription products, the FDA has evaluated these medicines and found that the benefits outweigh the risks. For users taking these products, risks include changes in behavior, depressed mood, hostility, aggression, and suicidal thoughts or actions.  The most common side effects of Chantix include nausea; constipation; gas; vomiting; and trouble sleeping or vivid, unusual, or strange dreams. Chantix also may change how you react to alcohol, so talk to your health care provider about your drinking habits (if you drink alcohol) and whether these habits need to change. Chantix is not recommended for people under the age of 21.  The most commonly observed side effects consistently associated with the use of Zyban are dry mouth and insomnia.  Because Zyban contains the same active ingredient as the antidepressant Wellbutrin (bupropion), the FDA encourages people who use Zyban-and those who are considering it-to talk to their health care providers about the risks of treatment with antidepressant medicines. Zyban has not been studied in children under the age of 14 and is not approved for use in children and teenagers.  Note: If your health care provider prescribes Chantix or Zyban, please read the product's patient medication guide in its entirety. These guides offer important information on side effects, risks, warnings, product ingredients, and what you should talk about with your health care provider before taking the products.  Finally, if you ever have any side effects related to any smoking cessation products, or have any other problems related to your treatment, the FDA would like to hear from you. Please  consider making a voluntary and confidential report to the FDA's MedWatch program.  Updated: February 07, 2016

## 2017-09-28 ENCOUNTER — Ambulatory Visit: Payer: Managed Care, Other (non HMO) | Admitting: Family Medicine

## 2017-10-16 ENCOUNTER — Encounter: Payer: Self-pay | Admitting: Family Medicine

## 2017-10-16 NOTE — Progress Notes (Signed)
Office Visit Note   Patient: Kellie Jones           Date of Birth: Jan 11, 1961           MRN: [id number] Visit Date: 03/08/2017              Requested by: Mellody Dance, DO [place],  [place] [id number] PCP: Mellody Dance, DO   Assessment & Plan: Visit Diagnoses:  1. Chronic low back pain without sciatica, unspecified back pain laterality   2. Spinal stenosis of lumbosacral region     Plan: Since patient has failed conservative treatment with PT x6 weeks and home exercise program recommend getting CT scan and patient will follow-up with Dr. Lorin Mercy after completion.  Follow-Up Instructions: Return in about 3 weeks (around 03/29/2017) for dr yates for review ct.   Orders:  Orders Placed This Encounter  Procedures  . XR Lumbar Spine 2-3 Views  . CT LUMBAR SPINE W CONTRAST  . DG MYELOGRAPHY LUMBAR INJ LUMBOSACRAL   No orders of the defined types were placed in this encounter.     Procedures: No procedures performed   Clinical Data: No additional findings.   Subjective: No chief complaint on file.   HPI Patient returns with complaints of worsening low back pain.  States that after Christmas she was cleaning a squatted down to put dishes away and she had excruciating pain in her low back.  States that pain radiates down the right greater than left leg.  Aggravated with bending twisting ambulating.  She has gone through formal PT x6 weeks without any improvement of her chronic back problem.  She has also been doing home exercises. Review of Systems No current cardiac pulmonary GI GU issues  Objective: Vital Signs: BP (!) 132/96 (BP Location: Right Arm)   Pulse 96   Physical Exam HENT:     Head: Normocephalic and atraumatic.  Eyes:     Extraocular Movements: Extraocular movements intact.     Pupils: Pupils are equal, round, and reactive to light.  Pulmonary:     Effort: No respiratory distress.  Musculoskeletal:     Comments: Gait antalgic.   Lumbar paraspinal tenderness.  Positive sciatic notch tenderness.  Negative logroll.  Pain with straight leg raise.  Bilateral calves nontender.  Neurological:     General: No focal deficit present.     Mental Status: She is alert and oriented to person, place, and time.     Ortho Exam  Specialty Comments:  No specialty comments available.  Imaging: No results found.   PMFS History: Patient Active Problem List   Diagnosis Date Noted  . Hyperlipidemia 10/08/2018  . Status post lumbar spinal fusion 06/11/2018  . Lumbar stenosis 05/03/2018  . Preop cardiovascular exam 04/22/2018  . Dyslipidemia 04/22/2018  . 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 Shortness of breath on exertion 04/08/2018  . Radiculopathy, lumbar region 03/06/2018  . Chronic back pain greater than 3 months duration 09/24/2017  . h/o Excessive drinking alcohol 09/24/2017  . Mammogram declined despite multiple attempts to get patient to go especially since her daughter with premature breast cancer 09/24/2017  . 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 Family history of breast cancer in first degree relative---> daughter- at age 45, 28nd daughter 44 08/17/2016  . Hyperalgesia- skin of feet.  07/17/2016  . Fibrocystic breast changes 07/17/2016  . GERD (gastroesophageal reflux disease) 01/19/2016  . Vitamin D deficiency 01/03/2016  . Tobacco  use disorder: >30pk yr hx 01/03/2016  . Tobacco abuse counseling 01/03/2016  . Erythrocytosis due to hypoxemia 01/03/2016  . History of gastroesophageal reflux (GERD) 01/03/2016  . Environmental and seasonal allergies 01/03/2016  . Bilateral feet cramps- worse at night 12/11/2015  . Migraine 12/07/2015  . Hypothyroidism 12/07/2015  . COPD still smoking  12/07/2015  . Anxiety 12/07/2015  . Depression 12/07/2015  . Other fatigue 12/07/2015  . Absolute anemia 12/07/2015   Past Medical History:  Diagnosis Date  . Anxiety   .  Arthritis   . Asthma due to seasonal allergies   . Constipation   . COPD (chronic obstructive pulmonary disease) ([place])   . Depression   . Dyslipidemia   . Dyspnea   . History of blood transfusion   . History of kidney stones    several passed  . Hypothyroidism   . Migraine    Migraines  . Neuropathic pain   . Pneumonia    frequent - last time 2015 ish  . PONV (postoperative nausea and vomiting)   . Spinal stenosis     Family History  Problem Relation Age of Onset  . Depression Mother   . Cancer Father 55       pancreatic  . Healthy Sister   . Healthy Brother   . Cancer Maternal Aunt 35       breast  . Cancer Maternal Grandmother 2       breast  . Healthy Brother   . CAD Brother 43       CABG  . Healthy Brother     Past Surgical History:  Procedure Laterality Date  . ABDOMINAL HYSTERECTOMY     total  . APPENDECTOMY    . CHOLECYSTECTOMY    . COLONOSCOPY     Social History   Occupational History  . Not on file  Tobacco Use  . Smoking status: Current Some Day Smoker    Packs/day: 0.25    Years: 34.00    Pack years: 8.50  . Smokeless tobacco: Never Used  Substance and Sexual Activity  . Alcohol use: Yes    Comment: rarely  . Drug use: No  . Sexual activity: Never

## 2017-11-15 NOTE — Progress Notes (Signed)
Impression and Recommendations:    1. COPD still smoking    2. Tobacco use disorder: >30pk yr hx   3. Tobacco abuse counseling   4. Excessive drinking alcohol   5. Current moderate episode of major depressive disorder, unspecified whether recurrent ([place])   6. Anxiety   7. Chronic back pain greater than 3 months duration   8. Screening for breast cancer   9. Family history of breast cancer in first degree relative---> daughter- at age 57, 14nd daughter 39   10. History of surgery-has had total abdominal hysterectomy, cholecystectomy and appendectomy     Education and routine counseling performed. Handouts provided.  Orders Placed This Encounter  Procedures  . MM DIGITAL SCREENING BILATERAL    Medications Discontinued During This Encounter  Medication Reason  . DULoxetine (CYMBALTA) 20 MG capsule Reorder     Meds ordered this encounter  Medications  . DISCONTD: DULoxetine HCl 40 MG CPEP    Sig: Take 40 mg by mouth daily.    Dispense:  90 capsule    Refill:  1    Gross side effects, risk and benefits, and alternatives of medications and treatment plan in general discussed with patient.  Patient is aware that all medications have potential side effects and we are unable to predict every side effect or drug-drug interaction that may occur.   Patient will call with any questions prior to using medication if they have concerns.  Expresses verbal understanding and consents to current therapy and treatment regimen.  No barriers to understanding were identified.  Red flag symptoms and signs discussed in detail.  Patient expressed understanding regarding what to do in case of emergency\urgent symptoms  Please see AVS handed out to patient at the end of our visit for further patient instructions/ counseling done pertaining to today's office visit.   Return for 6mo or sooner prn; (come in for fasting blood work one week prior for full labs) .     Note:  This document was  prepared using Dragon voice recognition software and may include unintentional dictation errors.  I have reviewed the above documentation for accuracy and completeness, and I agree with the above.   Mellody Dance 12/31/17 8:04 AM  --------------------------------------------------------------------------------------------------------------------------------------------------------------------------------------------------------------------------------------------    Subjective:    CC:  Chief Complaint  Patient presents with  . Follow-up    HPI: Kellie Jones is a 57 y.o. female who presents to [place] [facility] today for follow-up of mood and starting new med for mood- cymbalta.   MOOD:    Last OV on 7/29:         -Counseled patient on starting cymbalta to help balance her mood and may help with her chronic pain  -Discussed plan to decrease Zoloft to 100mg   -Encouraged patient to seek out counseling and recommended therapists for stress and mood management.  Asked CMA to provide list to pt. -Requested patient to plan a follow-up in 6-8 weeks, or to notify immediately with red flag symptoms  ---> TODAY pt states it is helping mngt the pain in her back and also her feel emotionally better.  Patient took her 20 mg x 2 weeks now she is up to 40 mg daily.  Tolerating well without side effect wonderful she also went down on her Zoloft and has been felt feeling very well. Sleeping ok- a little better than prior.    ETOH:  -Much less intake versus last office visit she is down  to 1 to 2 glasses of beer or wine a day or none at all.  Since starting the Cymbalta actually, patient states she is only had one drink one time in the past 4 to 6 weeks   Tob abuse:   - pt vaping now still.   She is at 3 mg of nicotine.  She uses a 10 mL bottle and it last her 2 weeks - pt going to her lung doc next mo-  PFT's will be done and meds changed if needed    Family hx  Breast CA- pt non-compliant with Mammo:  -Understands risks and is making educated decision not to proceed with screening    Depression screen [facility] 2/9 11/16/2017 09/24/2017 05/18/2017  Decreased Interest 0 0 0  Down, Depressed, Hopeless 1 0 0  PHQ - 2 Score 1 0 0  Altered sleeping 3 - 0  Tired, decreased energy 3 - 0  Change in appetite 0 - 0  Feeling bad or failure about yourself  0 - 0  Trouble concentrating 0 - 0  Moving slowly or fidgety/restless 0 - 0  Suicidal thoughts 0 - 0  PHQ-9 Score 7 - 0  Difficult doing work/chores - - Not difficult at all     GAD 7 : Generalized Anxiety Score 05/18/2017 12/18/2016 08/17/2016 07/17/2016  Nervous, Anxious, on Edge 0 1 1 2   Control/stop worrying 0 1 0 0  Worry too much - different things 0 0 1 0  Trouble relaxing 0 2 0 0  Restless 0 1 0 0  Easily annoyed or irritable 0 1 1 2   Afraid - awful might happen 0 0 0 0  Total GAD 7 Score 0 6 3 4   Anxiety Difficulty Not difficult at all - Not difficult at all Somewhat difficult     Wt Readings from Last 3 Encounters:  11/16/17 152 lb 14.4 oz (69.4 kg)  09/24/17 153 lb 11.2 oz (69.7 kg)  05/18/17 148 lb 4.8 oz (67.3 kg)   BP Readings from Last 3 Encounters:  12/14/17 125/80  11/16/17 130/90  09/24/17 131/78   Pulse Readings from Last 3 Encounters:  12/14/17 94  11/16/17 79  09/24/17 74   BMI Readings from Last 3 Encounters:  11/16/17 24.68 kg/m  09/24/17 24.81 kg/m  05/18/17 23.94 kg/m         Patient Care Team    Relationship Specialty Notifications Start End  Mellody Dance, DO PCP - General Family Medicine  12/07/15   Gardiner Rhyme, MD Referring Physician Pulmonary Disease  12/08/15   Marybelle Killings, MD Consulting Physician Orthopedic Surgery  02/28/17    Comment: txs her back- work related injury     Patient Active Problem List   Diagnosis Date Noted  . Excessive drinking alcohol 09/24/2017    Priority: High  . Mammogram declined despite multiple attempts to  get patient to go especially since her daughter with premature breast cancer 09/24/2017    Priority: High  . 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 Family history of breast cancer in first degree relative---> daughter- at age 69, 45nd daughter 86 08/17/2016    Priority: High  . Hyperalgesia- skin of feet.  07/17/2016    Priority: High  . Tobacco use disorder: >30pk yr hx 01/03/2016    Priority: High  . Hypothyroidism 12/07/2015    Priority: High  . COPD still smoking  12/07/2015  Priority: High  . Vitamin D deficiency 01/03/2016    Priority: Medium  . Environmental and seasonal allergies 01/03/2016    Priority: Medium  . Other fatigue 12/07/2015    Priority: Medium  . GERD (gastroesophageal reflux disease) 01/19/2016    Priority: Low  . Tobacco abuse counseling 01/03/2016    Priority: Low  . Bilateral feet cramps- worse at night 12/11/2015    Priority: Low  . Anxiety 12/07/2015    Priority: Low  . Depression 12/07/2015    Priority: Low  . Chronic back pain greater than 3 months duration 09/24/2017  . Fibrocystic breast changes 07/17/2016  . Erythrocytosis due to hypoxemia 01/03/2016  . History of gastroesophageal reflux (GERD) 01/03/2016  . Migraine 12/07/2015  . Absolute anemia 12/07/2015    Past Medical history, Surgical history, Family history, Social history, Allergies and Medications have been entered into the medical record, reviewed and changed as needed.    Current Meds  Medication Sig  . albuterol (PROVENTIL HFA;VENTOLIN HFA) 108 (90 Base) MCG/ACT inhaler Inhale 1-2 puffs into the lungs every 6 (six) hours as needed. For wheezing  . Aspirin-Salicylamide-Caffeine (BC HEADACHE) 325-95-16 MG TABS Take 1 packet by mouth 3 (three) times daily as needed. For headache  . B Complex-C (B-COMPLEX WITH VITAMIN C) tablet Take 1 tablet by mouth daily.  . Cholecalciferol (VITAMIN D3) 5000 units CAPS Take 1 capsule  by mouth daily.  Marland Kitchen ibuprofen (ADVIL,MOTRIN) 200 MG tablet Take 400 mg by mouth every 6 (six) hours as needed. For joint pain  . levothyroxine (SYNTHROID, LEVOTHROID) 150 MCG tablet Take 1 tablet (150 mcg total) by mouth daily.  . metaxalone (SKELAXIN) 800 MG tablet Take 800 mg by mouth. One tablet 3-4 times daily as needed  . sertraline (ZOLOFT) 100 MG tablet Take 1 tablet (100 mg total) by mouth at bedtime.  . SUMAtriptan (IMITREX) 100 MG tablet Take 1 tablet (100 mg total) by mouth as needed. Take along with 400mg  ibuprofen at onset of migraine  . TRELEGY ELLIPTA 100-62.5-25 MCG/INH AEPB   . [DISCONTINUED] DULoxetine (CYMBALTA) 20 MG capsule 20 mg daily x2 weeks then go to 2 tabs daily  . [DISCONTINUED] DULoxetine HCl 40 MG CPEP Take 40 mg by mouth daily.    Allergies:  Allergies  Allergen Reactions  . Iodinated Diagnostic Agents Swelling and Other (See Comments)    Sneezing and eye swelling  . Other Anaphylaxis    Contrast media dye  . Codeine Rash    Review of Systems: General:   No F/C, wt loss Pulm:   No DIB, SOB, pleuritic chest pain Card:  No CP, palpitations Abd:  No n/v/d or pain Ext:  No inc edema from baseline Psych: no SI/ HI   Objective:   Blood pressure 130/90, pulse 79, height 5\' 6"  (1.676 m), weight 152 lb 14.4 oz (69.4 kg), SpO2 97 %. Body mass index is 24.68 kg/m. General:  Well Developed, well nourished, appropriate for stated age.  Neuro:  Alert and oriented,  extra-ocular muscles intact  HEENT:  Normocephalic, atraumatic, neck supple, no carotid bruits appreciated  Skin:  no gross rash, warm, pink. Cardiac:  RRR, S1 S2 Respiratory:  ECTA B/L and A/P, Not using accessory muscles, speaking in full sentences- unlabored. Vascular:  Ext warm, no cyanosis apprec.; cap RF less 2 sec. Psych:  No HI/SI, judgement and insight good, Euthymic mood. Full Affect.

## 2017-11-15 NOTE — Patient Instructions (Addendum)
  Due for FBW 10/19-  Full set please   Please make sure you go for your mammogram by the time I see you next time

## 2017-11-16 ENCOUNTER — Encounter: Payer: Self-pay | Admitting: Family Medicine

## 2017-11-16 ENCOUNTER — Ambulatory Visit: Payer: Managed Care, Other (non HMO) | Admitting: Family Medicine

## 2017-11-16 VITALS — BP 130/90 | HR 79 | Ht 66.0 in | Wt 152.9 lb

## 2017-11-16 DIAGNOSIS — F1010 Alcohol abuse, uncomplicated: Secondary | ICD-10-CM

## 2017-11-16 DIAGNOSIS — J449 Chronic obstructive pulmonary disease, unspecified: Secondary | ICD-10-CM | POA: Diagnosis not present

## 2017-11-16 DIAGNOSIS — F17200 Nicotine dependence, unspecified, uncomplicated: Secondary | ICD-10-CM

## 2017-11-16 DIAGNOSIS — G8929 Other chronic pain: Secondary | ICD-10-CM

## 2017-11-16 DIAGNOSIS — Z716 Tobacco abuse counseling: Secondary | ICD-10-CM

## 2017-11-16 DIAGNOSIS — Z1231 Encounter for screening mammogram for malignant neoplasm of breast: Secondary | ICD-10-CM

## 2017-11-16 DIAGNOSIS — Z803 Family history of malignant neoplasm of breast: Secondary | ICD-10-CM

## 2017-11-16 DIAGNOSIS — Z1239 Encounter for other screening for malignant neoplasm of breast: Secondary | ICD-10-CM

## 2017-11-16 DIAGNOSIS — F419 Anxiety disorder, unspecified: Secondary | ICD-10-CM

## 2017-11-16 DIAGNOSIS — M549 Dorsalgia, unspecified: Secondary | ICD-10-CM

## 2017-11-16 DIAGNOSIS — Z9889 Other specified postprocedural states: Secondary | ICD-10-CM

## 2017-11-16 DIAGNOSIS — F321 Major depressive disorder, single episode, moderate: Secondary | ICD-10-CM

## 2017-11-16 MED ORDER — DULOXETINE HCL 40 MG PO CPEP: 40.0000 mg | ORAL_CAPSULE | Freq: Every day | ORAL | 1 refills | Status: DC

## 2017-11-21 ENCOUNTER — Encounter: Payer: Self-pay | Admitting: Family Medicine

## 2017-11-22 ENCOUNTER — Other Ambulatory Visit: Payer: Self-pay

## 2017-11-22 DIAGNOSIS — F419 Anxiety disorder, unspecified: Secondary | ICD-10-CM

## 2017-11-22 DIAGNOSIS — F321 Major depressive disorder, single episode, moderate: Secondary | ICD-10-CM

## 2017-11-22 DIAGNOSIS — G8929 Other chronic pain: Secondary | ICD-10-CM

## 2017-11-22 DIAGNOSIS — M549 Dorsalgia, unspecified: Secondary | ICD-10-CM

## 2017-11-22 MED ORDER — DULOXETINE HCL 40 MG PO CPEP: 40.0000 mg | ORAL_CAPSULE | Freq: Every day | ORAL | 1 refills | Status: DC

## 2017-12-12 ENCOUNTER — Encounter: Payer: Self-pay | Admitting: Family Medicine

## 2017-12-14 ENCOUNTER — Ambulatory Visit (INDEPENDENT_AMBULATORY_CARE_PROVIDER_SITE_OTHER): Payer: Managed Care, Other (non HMO)

## 2017-12-14 VITALS — BP 125/80 | HR 94

## 2017-12-14 DIAGNOSIS — Z23 Encounter for immunization: Secondary | ICD-10-CM

## 2017-12-14 NOTE — Progress Notes (Signed)
Pt here for influenza vaccine.  Screening questionnaire reviewed, VIS provided to patient, and any/all patient questions answered.  T. Nelson, CMA  

## 2018-02-05 ENCOUNTER — Other Ambulatory Visit: Payer: Self-pay | Admitting: Family Medicine

## 2018-02-05 DIAGNOSIS — G43001 Migraine without aura, not intractable, with status migrainosus: Secondary | ICD-10-CM

## 2018-03-06 ENCOUNTER — Ambulatory Visit (INDEPENDENT_AMBULATORY_CARE_PROVIDER_SITE_OTHER): Payer: Worker's Compensation | Admitting: Orthopaedic Surgery

## 2018-03-06 ENCOUNTER — Encounter (INDEPENDENT_AMBULATORY_CARE_PROVIDER_SITE_OTHER): Payer: Self-pay | Admitting: Orthopaedic Surgery

## 2018-03-06 ENCOUNTER — Ambulatory Visit (INDEPENDENT_AMBULATORY_CARE_PROVIDER_SITE_OTHER): Payer: Worker's Compensation

## 2018-03-06 VITALS — BP 124/84 | HR 88 | Temp 97.2°F | Ht 66.0 in | Wt 160.0 lb

## 2018-03-06 DIAGNOSIS — M5416 Radiculopathy, lumbar region: Secondary | ICD-10-CM

## 2018-03-06 NOTE — Progress Notes (Signed)
Office Visit Note   Patient: Kellie Jones           Date of Birth: 1960/06/14           MRN: [id number] Visit Date: 03/06/2018              Requested by: Mellody Dance, DO [place], [place] [address] PCP: Mellody Dance, DO   Assessment & Plan: Visit Diagnoses:  1. Radiculopathy, lumbar region           With 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, on-the-job injury 10/23/2016.  Plan: Patient has been treated with therapy epidurals medications been greater than a year without relief.  She has persistent instability on flexion-extension x-rays with compression noted on myelogram CT scan with dynamic compression and standing position with persistent symptoms.  Patient's had IME who agreed with plan surgical procedure for decompression single level fusion.  Once [employer] approval is obtained we will proceed with surgical scheduling.  We discussed operative technique with the patient postoperative brace usual to night stay in the hospital.  Patient understands and agrees to proceed.  Risk of dural tear nerve root injury, reoperation, risks of anesthesia were discussed.  Usually in the 2 to 49-month range after surgery she should be able to resume work activity.  Follow-Up Instructions: No follow-ups on file.   Orders:  Orders Placed This Encounter  Procedures  . XR Lumb Spine Flex&Ext Only   No orders of the defined types were placed in this encounter.     Procedures: No procedures performed   Clinical Data: No additional findings.   Subjective: Chief Complaint  Patient presents with  . 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  She was treated conservatively, MRI scan showed no significant pathology.  Lumbar myelogram CT scan showed shifting at L4-5 of 3 mm with stenosis  in standing position and compression consistent with her severe back pain and leg pain.  She has had pain with bending twisting turning.  Patient been through physical therapy epidural injections.  Patient had an IME at [facility] with Dr.Mikles that states "I would agree with Dr. Inda Merlin less recommendation for decompression and fusion at L4-5.  This was a pre-existing condition that was exacerbated and aggravated by her work injury"  Patient is continued to have ongoing problems and now returns today for discussion about proceeding with surgery.  She had been seen by pulmonary doctor who had recommended Solu-Medrol 125 mg IV preop and nebulizer treatment before and after surgery.  Review of Systems 14 point systems positive for COPD seasonal allergies.  She states she is smoking 1 cigarette a day.  Negative for MI CVA.  Possible thyroid condition as well as GERD.   Objective: Vital Signs: BP 124/84 (BP Location: Right Arm, Patient Position: Sitting, Cuff Size: Normal)   Pulse 88   Temp (!) 97.2 F (36.2 C) (Oral)   Ht 5\' 6"  (1.676 m)   Wt 160 lb (72.6 kg)   BMI 25.82 kg/m   Physical Exam Constitutional:      Appearance: She is well-developed.  HENT:     Head: Normocephalic.     Right Ear: External ear normal.     Left Ear: External ear normal.  Eyes:  Pupils: Pupils are equal, round, and reactive to light.  Neck:     Thyroid: No thyromegaly.     Trachea: No tracheal deviation.  Cardiovascular:     Rate and Rhythm: Normal rate.  Pulmonary:     Effort: Pulmonary effort is normal.  Abdominal:     Palpations: Abdomen is soft.  Skin:    General: Skin is warm and dry.  Neurological:     Mental Status: She is alert and oriented to person, place, and time.  Psychiatric:        Behavior: Behavior normal.     Ortho Exam patient slow getting from sitting to standing.  She has bilateral sciatic notch tenderness.  Knee and ankle jerk are 3+ and symmetrical.  Normal hip  range of motion.  No quad weakness right or left.  One half grade ankle dorsiflexion weakness on the right as well as EHL one half grade weakness on the right normal on the left.  Plantarflexion right and left is symmetrical.  She has reproduction of pain with flexion 50% normal lumbar flexion as well as extension.  Tenderness with midline palpation lumbar spine. Specialty Comments:  No specialty comments available.  Imaging:CLINICAL DATA:  Low back pain.  RIGHT greater than LEFT leg pain.  EXAM: LUMBAR MYELOGRAM  FLUOROSCOPY TIME:  31 seconds corresponding to a Dose Area Product of 172.2 Gy*m2  PROCEDURE: After thorough discussion of risks and benefits of the procedure including bleeding, infection, injury to nerves, blood vessels, adjacent structures as well as headache and CSF leak, written and oral informed consent was obtained. Consent was obtained by Dr. Rolla Flatten. Time out form was completed.  Patient was positioned prone on the fluoroscopy table. Local anesthesia was provided with 1% lidocaine without epinephrine after prepped and draped in the usual sterile fashion. Puncture was performed at L3-L4 using a 3 1/2 inch 22-gauge spinal needle via midline approach. Using a single pass through the dura, the needle was placed within the thecal sac, with return of clear CSF. 15 mL of Isovue M-200 was injected into the thecal sac, with normal opacification of the nerve roots and cauda equina consistent with free flow within the subarachnoid space.  I personally performed the lumbar puncture and administered the intrathecal contrast. I also personally supervised acquisition of the myelogram images.  TECHNIQUE: Contiguous axial images were obtained through the Lumbar spine after the intrathecal infusion of infusion. Coronal and sagittal reconstructions were obtained of the axial image sets.  COMPARISON:  Outside MRI 11/16/2016.  FINDINGS: LUMBAR MYELOGRAM  FINDINGS:  Good opacification lumbar subarachnoid space. On prone spot films, there is subarticular zone narrowing on the RIGHT at L4-5, with effacement of the RIGHT L5 nerve root. No other areas of impingement are observed.  With patient standing, frank RIGHT L5 nerve root compression is observed on the AP radiograph, related to increasing subluxation at L4-5. 3 mm anterolisthesis at L4-5 prone, increases to 5 mm in upright neutral, 7 mm in flexion, and 6 mm in extension. In addition, 3 mm of anterolisthesis at L5-S1 develops, stable between flexion and extension.  CT LUMBAR MYELOGRAM FINDINGS:  Segmentation: Normal.  Alignment:  2 mm anterolisthesis L4-5 with patient recumbent for CT.  Vertebrae: No worrisome osseous lesion.Skeletal osteopenia.  Conus medullaris: Normal in size and location.  Termination at L1.  Paraspinal tissues: No evidence for hydronephrosis or paravertebral mass. Aortic atherosclerosis.  Disc levels:  L1-L2:  Normal.  L2-L3:  Normal.  L3-L4:  Normal disc space.  Facet arthropathy.  No impingement.  L4-L5: 2 mm anterolisthesis. Broad-based disc protrusion projects centrally into the ventral epidural space. Facet arthropathy and ligamentum flavum hypertrophy contributes to subarticular zone narrowing. Borderline stenosis. BILATERAL L5 nerve root effacement. Disc material extends into both foramina, without clear-cut L4 nerve root impingement.  L5-S1: Unremarkable disc space. Facet arthropathy. No impingement.  IMPRESSION: LUMBAR MYELOGRAM IMPRESSION:  Dynamic instability at L4-5, with anterolisthesis increasing to a maximum of 7 mm with patient standing in flexion.  Asymmetric subarticular zone narrowing at L4-5 on the RIGHT, with RIGHT L5 nerve root impingement most apparent with patient standing on AP view.  CT LUMBAR MYELOGRAM IMPRESSION:  Central protrusion at L4-5. BILATERAL L5 nerve root effacement due to 2 mm  slip, and posterior element hypertrophy. Either L5 nerve root could be affected.   Electronically Signed   By: Staci Righter M.D.   On: 04/10/2017 11:24    PMFS History: Patient Active Problem List   Diagnosis Date Noted  . Radiculopathy, lumbar region 03/06/2018  . Chronic back pain greater than 3 months duration 09/24/2017  . Excessive drinking alcohol 09/24/2017  . Mammogram declined despite multiple attempts to get patient to go especially since her daughter with premature breast cancer 09/24/2017  . 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 Family history of breast cancer in first degree relative---> daughter- at age 91, 81nd daughter 22 08/17/2016  . Hyperalgesia- skin of feet.  07/17/2016  . Fibrocystic breast changes 07/17/2016  . GERD (gastroesophageal reflux disease) 01/19/2016  . Vitamin D deficiency 01/03/2016  . Tobacco use disorder: >30pk yr hx 01/03/2016  . Tobacco abuse counseling 01/03/2016  . Erythrocytosis due to hypoxemia 01/03/2016  . History of gastroesophageal reflux (GERD) 01/03/2016  . Environmental and seasonal allergies 01/03/2016  . Bilateral feet cramps- worse at night 12/11/2015  . Migraine 12/07/2015  . Hypothyroidism 12/07/2015  . COPD still smoking  12/07/2015  . Anxiety 12/07/2015  . Depression 12/07/2015  . Other fatigue 12/07/2015  . Absolute anemia 12/07/2015   Past Medical History:  Diagnosis Date  . Anxiety   . COPD (chronic obstructive pulmonary disease) ([facility])   . Depression   . Hypothyroidism   . Migraine   . Neuropathic pain     Family History  Problem Relation Age of Onset  . Depression Mother   . Cancer Father 45       pancreatic  . Healthy Sister   . Healthy Brother   . Cancer Maternal Aunt 59       breast  . Cancer Maternal Grandmother 31       breast  . Healthy Brother   . Healthy Brother     Past Surgical History:  Procedure Laterality Date  . ABDOMINAL HYSTERECTOMY      total  . APPENDECTOMY    . CHOLECYSTECTOMY     Social History   Occupational History  . Not on file  Tobacco Use  . Smoking status: Former Smoker    Packs/day: 0.25    Years: 30.00    Pack years: 7.50    Last attempt to quit: 01/26/2017    Years since quitting: 1.1  . Smokeless tobacco: Never Used  Substance and Sexual Activity  . Alcohol use: Yes    Comment: rarely  . Drug use: No  . Sexual activity: Never

## 2018-03-07 ENCOUNTER — Telehealth (INDEPENDENT_AMBULATORY_CARE_PROVIDER_SITE_OTHER): Payer: Self-pay

## 2018-03-07 NOTE — Telephone Encounter (Signed)
-----   Message from Marybelle Killings, MD sent at 03/06/2018 10:16 AM EST ----- Proceed with getting surgical approval.  She had an IME 6 months ago who agreed with surgical plan.

## 2018-03-07 NOTE — Telephone Encounter (Signed)
Faxed note to Shakura Cowing @ [facility] [phone]

## 2018-03-11 ENCOUNTER — Other Ambulatory Visit (INDEPENDENT_AMBULATORY_CARE_PROVIDER_SITE_OTHER): Payer: 59

## 2018-03-11 DIAGNOSIS — Z0000 Encounter for general adult medical examination without abnormal findings: Secondary | ICD-10-CM

## 2018-03-11 DIAGNOSIS — E039 Hypothyroidism, unspecified: Secondary | ICD-10-CM

## 2018-03-11 DIAGNOSIS — F1010 Alcohol abuse, uncomplicated: Secondary | ICD-10-CM

## 2018-03-11 DIAGNOSIS — E559 Vitamin D deficiency, unspecified: Secondary | ICD-10-CM

## 2018-03-12 ENCOUNTER — Telehealth (INDEPENDENT_AMBULATORY_CARE_PROVIDER_SITE_OTHER): Payer: Self-pay

## 2018-03-12 LAB — COMPREHENSIVE METABOLIC PANEL
A/G RATIO: 2.6 — AB (ref 1.2–2.2)
ALT: 13 IU/L (ref 0–32)
AST: 12 IU/L (ref 0–40)
Albumin: 4.7 g/dL (ref 3.5–5.5)
Alkaline Phosphatase: 93 IU/L (ref 39–117)
BUN/Creatinine Ratio: 18 (ref 9–23)
BUN: 14 mg/dL (ref 6–24)
Bilirubin Total: 0.2 mg/dL (ref 0.0–1.2)
CO2: 22 mmol/L (ref 20–29)
Calcium: 9.2 mg/dL (ref 8.7–10.2)
Chloride: 106 mmol/L (ref 96–106)
Creatinine, Ser: 0.76 mg/dL (ref 0.57–1.00)
GFR calc Af Amer: 101 mL/min/{1.73_m2} (ref >59)
GFR calc non Af Amer: 87 mL/min/{1.73_m2} (ref >59)
Globulin, Total: 1.8 g/dL (ref 1.5–4.5)
Glucose: 90 mg/dL (ref 65–99)
POTASSIUM: 4.3 mmol/L (ref 3.5–5.2)
Sodium: 141 mmol/L (ref 134–144)
Total Protein: 6.5 g/dL (ref 6.0–8.5)

## 2018-03-12 LAB — CBC WITH DIFFERENTIAL/PLATELET
Basophils Absolute: 0.1 10*3/uL (ref 0.0–0.2)
Basos: 1 %
EOS (ABSOLUTE): 0.1 10*3/uL (ref 0.0–0.4)
Eos: 2 %
Hematocrit: 42.1 % (ref 34.0–46.6)
Hemoglobin: 14.4 g/dL (ref 11.1–15.9)
Immature Grans (Abs): 0 10*3/uL (ref 0.0–0.1)
Immature Granulocytes: 1 %
LYMPHS: 35 %
Lymphocytes Absolute: 2.1 10*3/uL (ref 0.7–3.1)
MCH: 30.9 pg (ref 26.6–33.0)
MCHC: 34.2 g/dL (ref 31.5–35.7)
MCV: 90 fL (ref 79–97)
Monocytes Absolute: 0.4 10*3/uL (ref 0.1–0.9)
Monocytes: 7 %
Neutrophils Absolute: 3.3 10*3/uL (ref 1.4–7.0)
Neutrophils: 54 %
PLATELETS: 271 10*3/uL (ref 150–450)
RBC: 4.66 x10E6/uL (ref 3.77–5.28)
RDW: 12.9 % (ref 11.7–15.4)
WBC: 6.1 10*3/uL (ref 3.4–10.8)

## 2018-03-12 LAB — LIPID PANEL
Chol/HDL Ratio: 3.3 ratio (ref 0.0–4.4)
Cholesterol, Total: 182 mg/dL (ref 100–199)
HDL: 56 mg/dL (ref >39)
LDL CALC: 110 mg/dL — AB (ref 0–99)
Triglycerides: 79 mg/dL (ref 0–149)
VLDL Cholesterol Cal: 16 mg/dL (ref 5–40)

## 2018-03-12 LAB — HEMOGLOBIN A1C
Est. average glucose Bld gHb Est-mCnc: 108 mg/dL
Hgb A1c MFr Bld: 5.4 % (ref 4.8–5.6)

## 2018-03-12 LAB — VITAMIN D 25 HYDROXY (VIT D DEFICIENCY, FRACTURES): Vit D, 25-Hydroxy: 30.7 ng/mL (ref 30.0–100.0)

## 2018-03-12 LAB — T4, FREE: Free T4: 1.11 ng/dL (ref 0.82–1.77)

## 2018-03-12 LAB — TSH: TSH: 3.44 u[IU]/mL (ref 0.450–4.500)

## 2018-03-12 NOTE — Telephone Encounter (Signed)
Received voicemail from this lady with [facility] wanting to schedule lumbar fusion surgery for patient

## 2018-03-18 ENCOUNTER — Ambulatory Visit: Payer: Self-pay | Admitting: Family Medicine

## 2018-03-20 ENCOUNTER — Encounter (INDEPENDENT_AMBULATORY_CARE_PROVIDER_SITE_OTHER): Payer: Self-pay | Admitting: Orthopaedic Surgery

## 2018-04-01 ENCOUNTER — Ambulatory Visit: Payer: 59 | Admitting: Family Medicine

## 2018-04-08 ENCOUNTER — Encounter (INDEPENDENT_AMBULATORY_CARE_PROVIDER_SITE_OTHER): Payer: Self-pay | Admitting: Orthopaedic Surgery

## 2018-04-08 ENCOUNTER — Ambulatory Visit: Payer: 59 | Admitting: Family Medicine

## 2018-04-08 ENCOUNTER — Encounter: Payer: Self-pay | Admitting: Family Medicine

## 2018-04-08 VITALS — BP 110/76 | HR 84 | Temp 97.9°F | Ht 66.0 in | Wt 165.8 lb

## 2018-04-08 DIAGNOSIS — F321 Major depressive disorder, single episode, moderate: Secondary | ICD-10-CM

## 2018-04-08 DIAGNOSIS — F17200 Nicotine dependence, unspecified, uncomplicated: Secondary | ICD-10-CM

## 2018-04-08 DIAGNOSIS — M549 Dorsalgia, unspecified: Secondary | ICD-10-CM | POA: Diagnosis not present

## 2018-04-08 DIAGNOSIS — Z716 Tobacco abuse counseling: Secondary | ICD-10-CM

## 2018-04-08 DIAGNOSIS — R0602 Shortness of breath: Secondary | ICD-10-CM | POA: Insufficient documentation

## 2018-04-08 DIAGNOSIS — E7841 Elevated Lipoprotein(a): Secondary | ICD-10-CM | POA: Diagnosis not present

## 2018-04-08 DIAGNOSIS — E039 Hypothyroidism, unspecified: Secondary | ICD-10-CM

## 2018-04-08 DIAGNOSIS — E559 Vitamin D deficiency, unspecified: Secondary | ICD-10-CM

## 2018-04-08 DIAGNOSIS — F419 Anxiety disorder, unspecified: Secondary | ICD-10-CM

## 2018-04-08 DIAGNOSIS — J449 Chronic obstructive pulmonary disease, unspecified: Secondary | ICD-10-CM

## 2018-04-08 DIAGNOSIS — Z7189 Other specified counseling: Secondary | ICD-10-CM

## 2018-04-08 DIAGNOSIS — G8929 Other chronic pain: Secondary | ICD-10-CM

## 2018-04-08 DIAGNOSIS — F1010 Alcohol abuse, uncomplicated: Secondary | ICD-10-CM

## 2018-04-08 MED ORDER — LEVOTHYROXINE SODIUM 150 MCG PO TABS: 150.0000 ug | ORAL_TABLET | Freq: Every day | ORAL | 1 refills | Status: DC

## 2018-04-08 MED ORDER — VITAMIN D (ERGOCALCIFEROL) 1.25 MG (50000 UNIT) PO CAPS: ORAL_CAPSULE | ORAL | 1 refills | Status: DC

## 2018-04-08 MED ORDER — ATORVASTATIN CALCIUM 20 MG PO TABS: 20.0000 mg | ORAL_TABLET | Freq: Every day | ORAL | 3 refills | Status: DC

## 2018-04-08 MED ORDER — DULOXETINE HCL 40 MG PO CPEP: 40.0000 mg | ORAL_CAPSULE | Freq: Every day | ORAL | 1 refills | Status: DC

## 2018-04-08 MED ORDER — SERTRALINE HCL 100 MG PO TABS: 100.0000 mg | ORAL_TABLET | Freq: Every day | ORAL | 1 refills | Status: DC

## 2018-04-08 NOTE — Patient Instructions (Addendum)
We are starting you on Lipitor every night before bed.    In 4 to 6 weeks you will need a repeat of ALT- your liver enzyme to make sure the Lipitor is not having a deleterious effect.    We will need repeat of your cholesterol panel 4 months.  -We are sending you to cardiology for cardiovascular surgical clearance.  Dorothea Ogle will be setting this up.  If you have not heard anything by the end of the week, please call him.  -Also you will need a note from your pulmonologist stating they think you are cleared to undergo surgery from a pulmonary standpoint for your upcoming lumbar decompression  -please make sure you obtain this as I will not be able to clear you for surgery unless I have this note/  or this stated in your pulmonologist's note that I can read.   -Also we are sending you in vitamin D supplement to take once weekly in addition to your daily.

## 2018-04-08 NOTE — Progress Notes (Signed)
Assessment and plan:  1. COPD- significantly impairs physical abilities   2. Chronic back pain greater than 3 months duration   3. Elevated lipoprotein(a)   4. Shortness of breath on exertion   5. Excess alcohol intake   6. Tobacco use disorder: >30pk yr hx   7. Tobacco abuse counseling   8. Current moderate episode of major depressive disorder, unspecified whether recurrent ([place])   9. Anxiety   10. Hypothyroidism, unspecified type   11. Vitamin D deficiency   12. Surgical counseling visit      COPD- significantly impairs physical abilities - Plan: Ambulatory referral to Cardiology  Chronic back pain greater than 3 months duration - Plan: DULoxetine HCl 40 MG CPEP, Ambulatory referral to Cardiology  Elevated lipoprotein(a) - Plan: Ambulatory referral to Cardiology, atorvastatin (LIPITOR) 20 MG tablet  Shortness of breath on exertion - Plan: Ambulatory referral to Cardiology  Excess alcohol intake - Plan: Ambulatory referral to Cardiology  Tobacco use disorder: >30pk yr hx - Plan: Ambulatory referral to Cardiology  Tobacco abuse counseling  Current moderate episode of major depressive disorder, unspecified whether recurrent ([place]) - Plan: DULoxetine HCl 40 MG CPEP, sertraline (ZOLOFT) 100 MG tablet  Anxiety - Plan: sertraline (ZOLOFT) 100 MG tablet  Hypothyroidism, unspecified type - Plan: levothyroxine (SYNTHROID, LEVOTHROID) 150 MCG tablet  Vitamin D deficiency - Plan: Vitamin D, Ergocalciferol, (DRISDOL) 1.25 MG (50000 UT) CAPS capsule  Surgical counseling visit - Plan: Ambulatory referral to Cardiology  1. Lab Follow up -Labs from 03/11/2018 reviewed with the patient and were all WNL except LDL.  2. Elevated LDL -LDL from 03/11/2018 at 110.  -Discussed that the patient has her 10-year ASCVD risk score is NOT accurate  -The patient is still smoking cigarettes and has told every CMA she does not,  however, she is no longer smoking 3 PPD. - After discussion with the pt about R/B, she will be started on cholesterol medications.  -Will start the patient on 20 mg Lipitor nightly. Will recheck liver enzymes (ALT) in 4-6 weeks and repeat lipid panel in 4 months.  Dietary changes such as low saturated & trans fat and low carb discussed with patient.  Encouraged regular exercise and weight loss when appropriate.  -Educational handouts provided at patient's desire.   3. Vitamin D deficiency -Lab from 03/11/2018 showed Vitamin D levels at 30.7 improved from 1 year ago.  -Discussed with the patient that this level should be increased to at least the 30-60 range, which is ideal. -Taking daily 5,000 IU of vitamin D, however, will prescribe the patient ergo-calciferol weekly.   4. Chronic back pain pending surgery -Patient has elective back surgery on 05/03/2018.  -Patient will come back for clearance, prior to, patient will be referred to cardiology for a nuclear stress test for cardiovascular clearance  5. Hypothyroidism -Patient to continue on Synthroid as prescribed, patient doing well.    Follow up in the near future for surgical clearance and EKG. Due to no recent stress test on file, we will have the patient go for a nuclear stress test for cardiac clearance prior to her surgery date.     Education and routine counseling performed. Handouts provided.  Orders Placed This Encounter  Procedures  . Ambulatory referral to Cardiology    Meds ordered this encounter  Medications  . DULoxetine HCl 40 MG CPEP    Sig: Take 40 mg by mouth daily.    Dispense:  90 capsule  Refill:  1  . levothyroxine (SYNTHROID, LEVOTHROID) 150 MCG tablet    Sig: Take 1 tablet (150 mcg total) by mouth daily.    Dispense:  90 tablet    Refill:  1  . sertraline (ZOLOFT) 100 MG tablet    Sig: Take 1 tablet (100 mg total) by mouth at bedtime.    Dispense:  90 tablet    Refill:  1  . Vitamin D,  Ergocalciferol, (DRISDOL) 1.25 MG (50000 UT) CAPS capsule    Sig: Take one tablet wkly    Dispense:  12 capsule    Refill:  1  . atorvastatin (LIPITOR) 20 MG tablet    Sig: Take 1 tablet (20 mg total) by mouth at bedtime.    Dispense:  90 tablet    Refill:  3    Medications Discontinued During This Encounter  Medication Reason  . DULoxetine HCl 40 MG CPEP Reorder  . levothyroxine (SYNTHROID, LEVOTHROID) 150 MCG tablet Reorder  . sertraline (ZOLOFT) 100 MG tablet Reorder  . ibuprofen (ADVIL,MOTRIN) 200 MG tablet       Return for 2) needs appt for upcoming Sx Cl (after seen by cards) ; also started Lipitor.   Anticipatory guidance and routine counseling done re: condition, txmnt options and need for follow up. All questions of patient's were answered.   Gross side effects, risk and benefits, and alternatives of medications discussed with patient.  Patient is aware that all medications have potential side effects and we are unable to predict every sideeffect or drug-drug interaction that may occur.  Expresses verbal understanding and consents to current therapy plan and treatment regiment.  Please see AVS handed out to patient at the end of our visit for additional patient instructions/ counseling done pertaining to today's office visit.  Note:  This document was prepared using Dragon voice recognition software and may include unintentional dictation errors.    This document serves as a record of services personally performed by Mellody Dance, DO. It was created on her behalf by Steva Colder, a trained medical scribe. The creation of this record is based on the scribe's personal observations and the provider's statements to them.   I have reviewed the above medical documentation for accuracy and completeness and I concur.  Mellody Dance, DO 04/09/2018 7:06  AM     ----------------------------------------------------------------------------------------------------------------------  Subjective:   CC:   Kellie Jones is a 58 y.o. female who presents to [facility] today for review and discussion of recent bloodwork that was done in addition to f/up on chronic conditions we are managing for pt.  1. All recent blood work that we ordered was reviewed with patient today.  Patient was counseled on all abnormalities and we discussed dietary and lifestyle changes that could help those values (also medications when appropriate).  Extensive health counseling performed and all patient's concerns/ questions were addressed.  See labs below and also plan for more details of these abnormalities  She has elective back surgery in March and she is not able to go up a flight of stairs without having DOE. She is smoking, however it is decreased from 3 PPD. She is taking BC powder and Cymbalta for her back pain.     Wt Readings from Last 3 Encounters:  04/08/18 165 lb 12.8 oz (75.2 kg)  03/06/18 160 lb (72.6 kg)  11/16/17 152 lb 14.4 oz (69.4 kg)   BP Readings from Last 3 Encounters:  04/08/18 110/76  03/06/18 124/84  12/14/17 125/80   Pulse Readings from Last 3 Encounters:  04/08/18 84  03/06/18 88  12/14/17 94   BMI Readings from Last 3 Encounters:  04/08/18 26.76 kg/m  03/06/18 25.82 kg/m  11/16/17 24.68 kg/m     Patient Care Team    Relationship Specialty Notifications Start End  Mellody Dance, DO PCP - General Family Medicine  12/07/15   Gardiner Rhyme, MD Referring Physician Pulmonary Disease  12/08/15   Marybelle Killings, MD Consulting Physician Orthopedic Surgery  02/28/17    Comment: txs her back- work related injury    Full medical history updated and reviewed in the office today  Patient Active Problem List   Diagnosis Date Noted  . Excessive drinking alcohol 09/24/2017    Priority: High  . Mammogram  declined despite multiple attempts to get patient to go especially since her daughter with premature breast cancer 09/24/2017    Priority: High  . 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 Family history of breast cancer in first degree relative---> daughter- at age 77, 43nd daughter 28 08/17/2016    Priority: High  . Hyperalgesia- skin of feet.  07/17/2016    Priority: High  . Tobacco use disorder: >30pk yr hx 01/03/2016    Priority: High  . Hypothyroidism 12/07/2015    Priority: High  . COPD still smoking  12/07/2015    Priority: High  . Vitamin D deficiency 01/03/2016    Priority: Medium  . Environmental and seasonal allergies 01/03/2016    Priority: Medium  . Other fatigue 12/07/2015    Priority: Medium  . GERD (gastroesophageal reflux disease) 01/19/2016    Priority: Low  . Tobacco abuse counseling 01/03/2016    Priority: Low  . Bilateral feet cramps- worse at night 12/11/2015    Priority: Low  . Anxiety 12/07/2015    Priority: Low  . Depression 12/07/2015    Priority: Low  . 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 Shortness of breath on exertion 04/08/2018  . Radiculopathy, lumbar region 03/06/2018  . Chronic back pain greater than 3 months duration 09/24/2017  . Fibrocystic breast changes 07/17/2016  . Erythrocytosis due to hypoxemia 01/03/2016  . History of gastroesophageal reflux (GERD) 01/03/2016  . Migraine 12/07/2015  . Absolute anemia 12/07/2015    Past Medical History:  Diagnosis Date  . Anxiety   . COPD (chronic obstructive pulmonary disease) ([place])   . Depression   . Hypothyroidism   . Migraine   . Neuropathic pain     Past Surgical History:  Procedure Laterality Date  . ABDOMINAL HYSTERECTOMY     total  . APPENDECTOMY    . CHOLECYSTECTOMY      Social History   Tobacco Use  . Smoking status: Former Smoker    Packs/day: 0.25    Years: 30.00    Pack years: 7.50    Last attempt to  quit: 01/26/2017    Years since quitting: 1.2  . Smokeless tobacco: Never Used  Substance Use Topics  . Alcohol use: Yes    Comment: rarely    Family Hx: Family History  Problem Relation Age of Onset  . Depression Mother   . Cancer Father 36       pancreatic  . Healthy Sister   . Healthy Brother   . Cancer Maternal Aunt 58       breast  . Cancer Maternal Grandmother 54       breast  .  Healthy Brother   . Healthy Brother      Medications: Current Outpatient Medications  Medication Sig Dispense Refill  . albuterol (PROVENTIL HFA;VENTOLIN HFA) 108 (90 Base) MCG/ACT inhaler Inhale 1-2 puffs into the lungs every 6 (six) hours as needed. For wheezing 1 Inhaler 0  . Aspirin-Salicylamide-Caffeine (BC HEADACHE) 325-95-16 MG TABS Take 1 packet by mouth 3 (three) times daily as needed. For headache    . B Complex-C (B-COMPLEX WITH VITAMIN C) tablet Take 1 tablet by mouth daily.    . Cholecalciferol (VITAMIN D3) 5000 units CAPS Take 1 capsule by mouth daily.    . DULoxetine HCl 40 MG CPEP Take 40 mg by mouth daily. 90 capsule 1  . levothyroxine (SYNTHROID, LEVOTHROID) 150 MCG tablet Take 1 tablet (150 mcg total) by mouth daily. 90 tablet 1  . metaxalone (SKELAXIN) 800 MG tablet Take 800 mg by mouth. One tablet 3-4 times daily as needed    . sertraline (ZOLOFT) 100 MG tablet Take 1 tablet (100 mg total) by mouth at bedtime. 90 tablet 1  . SUMAtriptan (IMITREX) 100 MG tablet TAKE 1 TABLET BY MOUTH AS NEEDED. TAKE ALONG WITH 400MG  IBUPROFEN AT ONSET OF MIGRAINE. 10 tablet 1  . TRELEGY ELLIPTA 100-62.5-25 MCG/INH AEPB   3  . atorvastatin (LIPITOR) 20 MG tablet Take 1 tablet (20 mg total) by mouth at bedtime. 90 tablet 3  . Vitamin D, Ergocalciferol, (DRISDOL) 1.25 MG (50000 UT) CAPS capsule Take one tablet wkly 12 capsule 1   No current facility-administered medications for this visit.     Allergies:  Allergies  Allergen Reactions  . Iodinated Diagnostic Agents Swelling and Other (See  Comments)    Sneezing and eye swelling  . Other Anaphylaxis    Contrast media dye  . Codeine Rash     Review of Systems: General:   No F/C, wt loss Pulm:   No DIB, SOB, pleuritic chest pain Card:  No CP, palpitations Abd:  No n/v/d or pain Ext:  No inc edema from baseline  Objective:  Blood pressure 110/76, pulse 84, temperature 97.9 F (36.6 C), height 5\' 6"  (1.676 m), weight 165 lb 12.8 oz (75.2 kg), SpO2 98 %. Body mass index is 26.76 kg/m. Gen:   Well NAD, A and O *3 HEENT:    [place]/AT, EOMI,  MMM Lungs:   Normal work of breathing. Globally decreased aeration with increased exhalation phase and an occasional scattered wheeze.  rhonchi Heart:   RRR, S1, S2 WNL's, no MRG Abd:   No gross distention Exts:    warm, pink,  Brisk capillary refill, warm and well perfused.  Psych:    No HI/SI, judgement and insight good, Euthymic mood. Full Affect.   Recent Results (from the past 2160 hour(s))  CBC with Differential/Platelet     Status: None   Collection Time: 03/11/18  9:01 AM  Result Value Ref Range   WBC 6.1 3.4 - 10.8 x10E3/uL   RBC 4.66 3.77 - 5.28 x10E6/uL   Hemoglobin 14.4 11.1 - 15.9 g/dL   Hematocrit 42.1 34.0 - 46.6 %   MCV 90 79 - 97 fL   MCH 30.9 26.6 - 33.0 pg   MCHC 34.2 31.5 - 35.7 g/dL   RDW 12.9 11.7 - 15.4 %    Comment:               **Please note reference interval change**   Platelets 271 150 - 450 x10E3/uL   Neutrophils 54 Not Estab. %  Lymphs 35 Not Estab. %   Monocytes 7 Not Estab. %   Eos 2 Not Estab. %   Basos 1 Not Estab. %   Neutrophils Absolute 3.3 1.4 - 7.0 x10E3/uL   Lymphocytes Absolute 2.1 0.7 - 3.1 x10E3/uL   Monocytes Absolute 0.4 0.1 - 0.9 x10E3/uL   EOS (ABSOLUTE) 0.1 0.0 - 0.4 x10E3/uL   Basophils Absolute 0.1 0.0 - 0.2 x10E3/uL   Immature Granulocytes 1 Not Estab. %   Immature Grans (Abs) 0.0 0.0 - 0.1 x10E3/uL  Comprehensive metabolic panel     Status: Abnormal   Collection Time: 03/11/18  9:01 AM  Result Value Ref Range    Glucose 90 65 - 99 mg/dL   BUN 14 6 - 24 mg/dL   Creatinine, Ser 0.76 0.57 - 1.00 mg/dL   GFR calc non Af Amer 87 >59 mL/min/1.73   GFR calc Af Amer 101 >59 mL/min/1.73   BUN/Creatinine Ratio 18 9 - 23   Sodium 141 134 - 144 mmol/L   Potassium 4.3 3.5 - 5.2 mmol/L   Chloride 106 96 - 106 mmol/L   CO2 22 20 - 29 mmol/L   Calcium 9.2 8.7 - 10.2 mg/dL   Total Protein 6.5 6.0 - 8.5 g/dL   Albumin 4.7 3.5 - 5.5 g/dL    Comment:     **Effective March 18, 2018 Albumin reference**       interval will be changing to:              Age                Female          Female           0 -  7 days        3.6 - 4.9      3.6 - 4.9           8 - 30 days        3.4 - 4.7      3.4 - 4.7           1 -  6 month       3.7 - 4.8      3.7 - 4.8    7 months -  2 years       3.9 - 5.0      3.9 - 5.0           3 -  5 years       4.0 - 5.0      4.0 - 5.0           6 - 12 years       4.1 - 5.0      4.0 - 5.0          13 - 30 years       4.1 - 5.2      3.9 - 5.0          31 - 50 years       4.0 - 5.0      3.8 - 4.8          51 - 60 years       3.8 - 4.9      3.8 - 4.9          61 - 70 years       3.8 - 4.8      3.8 - 4.8  71 - 80 years       3.7 - 4.7      3.7 - 4.7          81 - 89 years       3.6 - 4.6      3.6 - 4.6              >89 years       3.5 - 4.6      3.5 - 4.6    Globulin, Total 1.8 1.5 - 4.5 g/dL   Albumin/Globulin Ratio 2.6 (H) 1.2 - 2.2   Bilirubin Total 0.2 0.0 - 1.2 mg/dL   Alkaline Phosphatase 93 39 - 117 IU/L   AST 12 0 - 40 IU/L   ALT 13 0 - 32 IU/L  Hemoglobin A1c     Status: None   Collection Time: 03/11/18  9:01 AM  Result Value Ref Range   Hgb A1c MFr Bld 5.4 4.8 - 5.6 %    Comment:          Prediabetes: 5.7 - 6.4          Diabetes: >6.4          Glycemic control for adults with diabetes: <7.0    Est. average glucose Bld gHb Est-mCnc 108 mg/dL  Lipid panel     Status: Abnormal   Collection Time: 03/11/18  9:01 AM  Result Value Ref Range   Cholesterol, Total 182 100 -  199 mg/dL   Triglycerides 79 0 - 149 mg/dL   HDL 56 >39 mg/dL   VLDL Cholesterol Cal 16 5 - 40 mg/dL   LDL Calculated 110 (H) 0 - 99 mg/dL   Chol/HDL Ratio 3.3 0.0 - 4.4 ratio    Comment:                                   T. Chol/HDL Ratio                                             Men  Women                               1/2 Avg.Risk  3.4    3.3                                   Avg.Risk  5.0    4.4                                2X Avg.Risk  9.6    7.1                                3X Avg.Risk 23.4   11.0   T4, free     Status: None   Collection Time: 03/11/18  9:01 AM  Result Value Ref Range   Free T4 1.11 0.82 - 1.77 ng/dL  TSH     Status: None   Collection Time: 03/11/18  9:01 AM  Result Value Ref Range   TSH 3.440 0.450 -  4.500 uIU/mL  VITAMIN D 25 Hydroxy (Vit-D Deficiency, Fractures)     Status: None   Collection Time: 03/11/18  9:01 AM  Result Value Ref Range   Vit D, 25-Hydroxy 30.7 30.0 - 100.0 ng/mL    Comment: Vitamin D deficiency has been defined by the [place] practice guideline as a level of serum 25-OH vitamin D less than 20 ng/mL (1,2). The Endocrine Society went on to further define vitamin D insufficiency as a level between 21 and 29 ng/mL (2). 1. IOM (Institute of Medicine). 2010. Dietary reference    intakes for calcium and D. [place]: The    [employer]. 2. Holick MF, Binkley Grady, Bischoff-Ferrari HA, et al.    Evaluation, treatment, and prevention of vitamin D    deficiency: an Endocrine Society clinical practice    guideline. JCEM. 2011 Jul; 96(7):1911-30.

## 2018-04-15 ENCOUNTER — Other Ambulatory Visit (INDEPENDENT_AMBULATORY_CARE_PROVIDER_SITE_OTHER): Payer: Self-pay | Admitting: Radiology

## 2018-04-15 DIAGNOSIS — M5416 Radiculopathy, lumbar region: Secondary | ICD-10-CM

## 2018-04-19 ENCOUNTER — Ambulatory Visit (INDEPENDENT_AMBULATORY_CARE_PROVIDER_SITE_OTHER): Payer: Worker's Compensation | Admitting: Surgery

## 2018-04-19 ENCOUNTER — Encounter (INDEPENDENT_AMBULATORY_CARE_PROVIDER_SITE_OTHER): Payer: Self-pay | Admitting: Surgery

## 2018-04-19 VITALS — BP 131/85 | HR 73 | Ht 66.5 in | Wt 165.0 lb

## 2018-04-19 DIAGNOSIS — M4316 Spondylolisthesis, lumbar region: Secondary | ICD-10-CM

## 2018-04-19 DIAGNOSIS — M5416 Radiculopathy, lumbar region: Secondary | ICD-10-CM

## 2018-04-19 DIAGNOSIS — Z01818 Encounter for other preprocedural examination: Secondary | ICD-10-CM

## 2018-04-19 IMAGING — CT CT L SPINE W/ CM
1 of 6 series · 6 of 14 positions shown, 8 images · non-contrast
Comparison: Outside MRI 11/16/2016.

CLINICAL DATA: Low back pain.  RIGHT greater than LEFT leg pain.
TECHNIQUE: Contiguous axial images were obtained through the Lumbar spine after
the intrathecal infusion of infusion. Coronal and sagittal
reconstructions were obtained of the axial image sets.

[Series 2: l spine soft · axial · 0.30mm/px · z∈[-211,-61]mm · 6 of 72 slices shown, 8 images]
[im 11/72  soft-tissue]
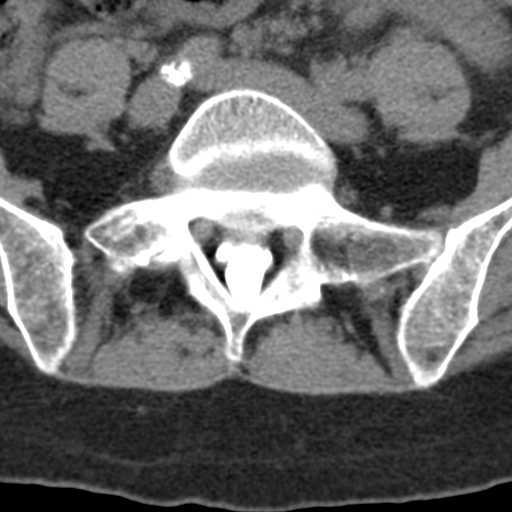
[im 11/72  bone]
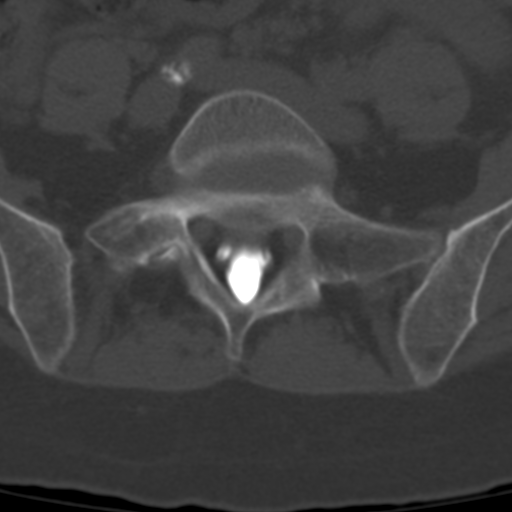
[im 21/72  bone]
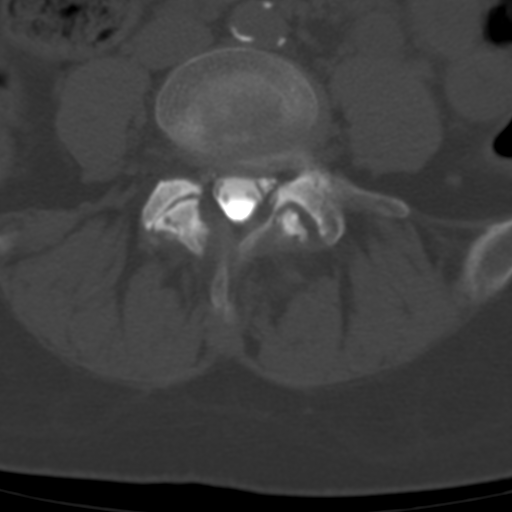
[im 31/72  bone]
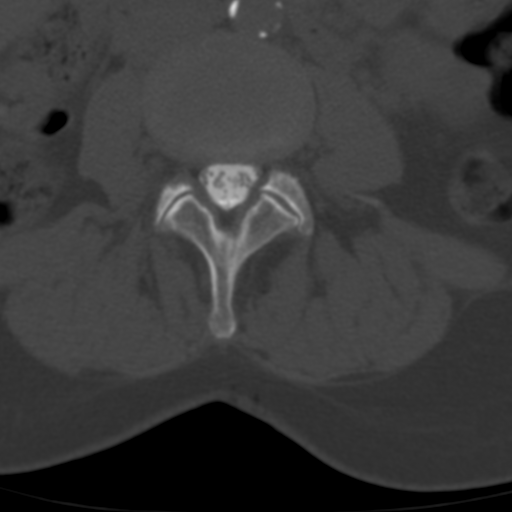
[im 41/72  bone]
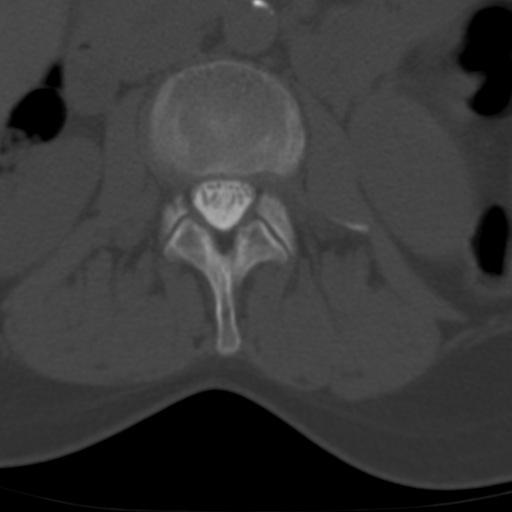
[im 51/72  soft-tissue]
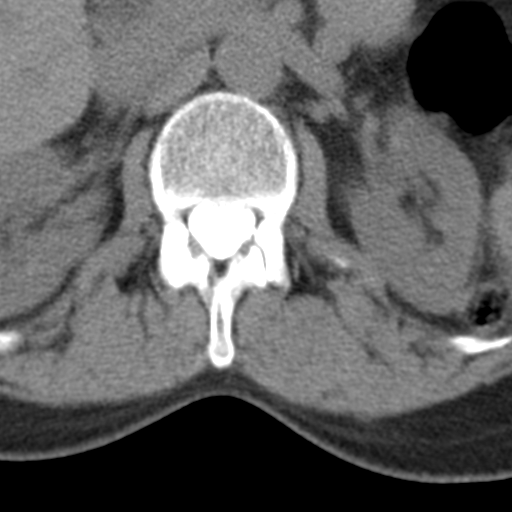
[im 51/72  bone]
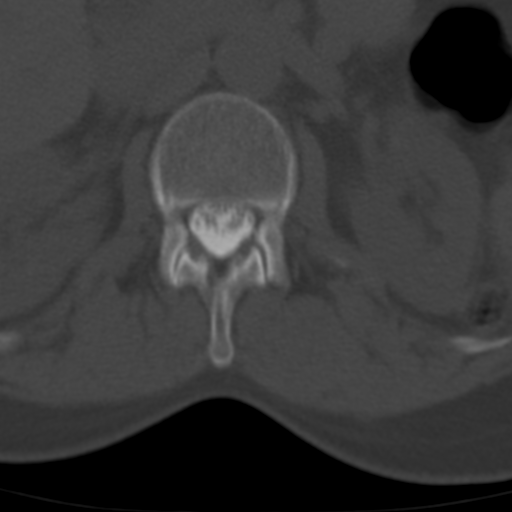
[im 61/72  bone]
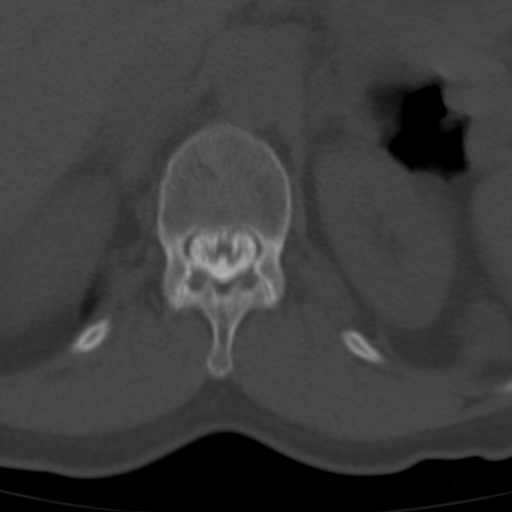

[6 of 14 positions shown; findings below may reference images not displayed]

EXAM:
LUMBAR MYELOGRAM

FLUOROSCOPY TIME:  31 seconds corresponding to a Dose Area Product
of 172.2 ?Gy*m2

PROCEDURE:
After thorough discussion of risks and benefits of the procedure
including bleeding, infection, injury to nerves, blood vessels,
adjacent structures as well as headache and CSF leak, written and
oral informed consent was obtained. Consent was obtained by Dr. Damith
Quwayne. Time out form was completed.

Patient was positioned prone on the fluoroscopy table. Local
anesthesia was provided with 1% lidocaine without epinephrine after
prepped and draped in the usual sterile fashion. Puncture was
performed at L3-L4 using a 3 1/2 inch 22-gauge spinal needle via
midline approach. Using a single pass through the dura, the needle
was placed within the thecal sac, with return of clear CSF. 15 mL of
Isovue 7-WII was injected into the thecal sac, with normal
opacification of the nerve roots and cauda equina consistent with
free flow within the subarachnoid space.

I personally performed the lumbar puncture and administered the
intrathecal contrast. I also personally supervised acquisition of
the myelogram images.
FINDINGS: LUMBAR MYELOGRAM FINDINGS:

Good opacification lumbar subarachnoid space. On prone spot films,
there is subarticular zone narrowing on the RIGHT at L4-5, with
effacement of the RIGHT L5 nerve root. No other areas of impingement
are observed.

With patient standing, frank RIGHT L5 nerve root compression is
observed on the AP radiograph, related to increasing subluxation at
L4-5. 3 mm anterolisthesis at L4-5 prone, increases to 5 mm in
upright neutral, 7 mm in flexion, and 6 mm in extension. In
addition, 3 mm of anterolisthesis at L5-S1 develops, stable between
flexion and extension.

CT LUMBAR MYELOGRAM FINDINGS:

Segmentation: Normal.

Alignment:  2 mm anterolisthesis L4-5 with patient recumbent for CT.

Vertebrae: No worrisome osseous lesion.Skeletal osteopenia.

Conus medullaris: Normal in size and location.  Termination at L1.

Paraspinal tissues: No evidence for hydronephrosis or paravertebral
mass. Aortic atherosclerosis.

Disc levels:

L1-L2:  Normal.

L2-L3:  Normal.

L3-L4:  Normal disc space.  Facet arthropathy.  No impingement.

L4-L5: 2 mm anterolisthesis. Broad-based disc protrusion projects
centrally into the ventral epidural space. Facet arthropathy and
ligamentum flavum hypertrophy contributes to subarticular zone
narrowing. Borderline stenosis. BILATERAL L5 nerve root effacement.
Disc material extends into both foramina, without clear-cut L4 nerve
root impingement.

L5-S1: Unremarkable disc space. Facet arthropathy. No impingement.
IMPRESSION: LUMBAR MYELOGRAM IMPRESSION:

Dynamic instability at L4-5, with anterolisthesis increasing to a
maximum of 7 mm with patient standing in flexion.

Asymmetric subarticular zone narrowing at L4-5 on the RIGHT, with
RIGHT L5 nerve root impingement most apparent with patient standing
on AP view.

CT LUMBAR MYELOGRAM IMPRESSION:

Central protrusion at L4-5. BILATERAL L5 nerve root effacement due
to 2 mm slip, and posterior element hypertrophy. Either L5 nerve
root could be affected.

## 2018-04-19 NOTE — Progress Notes (Signed)
58 year old white female history of L4-5 stenosis comes in for preop evaluation.  States that symptoms unchanged from previous visit.  She is want to proceed with L4 5 TLIF as scheduled.  We are awaiting preop stress test.  Surgery procedure along with potential rehab/recovery time discussed.  All questions answered and wishes to proceed.

## 2018-04-21 NOTE — Progress Notes (Signed)
Cardiology Office Note   Date:  04/22/2018   ID:  Kellie Jones, DOB Oct 04, 1960, MRN [id number]  PCP:  Mellody Dance, DO  Cardiologist:   No primary care provider on file. Referring:  Mellody Dance, DO  Chief Complaint  Patient presents with  . Shortness of Breath      History of Present Illness: Kellie Jones is a 58 y.o. female who is referred by Mellody Dance, DO for preop evaluation prior to lumbar surgery.  She has no past cardiac history but she does have significant risk factors.  She has longstanding shortness of breath and has been a longstanding smoker with COPD.  She has been limited because of back pain and is due to have surgery next month.  She is going to have lumbar spinal fusion.  She is not had any prior cardiac history other than a monitor years ago and a stress test years ago.  She does not think either of them were abnormal and she does not know the details.  She has gained 34 pounds since she had stopped working because of her back about 19 months ago.  She just has been able to much in the way of activities.  However, with her usual daily activities she is not describing any chest pressure, neck or arm discomfort.  She is not having any palpitations, presyncope or syncope.  She denies any PND or orthopnea.  She is had no edema.   Past Medical History:  Diagnosis Date  . Anxiety   . COPD (chronic obstructive pulmonary disease) ([place])   . Depression   . Dyslipidemia   . Hypothyroidism   . Migraine   . Neuropathic pain     Past Surgical History:  Procedure Laterality Date  . ABDOMINAL HYSTERECTOMY     total  . APPENDECTOMY    . CHOLECYSTECTOMY       Current Outpatient Medications  Medication Sig Dispense Refill  . albuterol (PROVENTIL HFA;VENTOLIN HFA) 108 (90 Base) MCG/ACT inhaler Inhale 1-2 puffs into the lungs every 6 (six) hours as needed. For wheezing 1 Inhaler 0  . Aspirin-Salicylamide-Caffeine (BC HEADACHE) 325-95-16 MG TABS Take 1  packet by mouth 3 (three) times daily as needed (headache).     Marland Kitchen atorvastatin (LIPITOR) 20 MG tablet Take 1 tablet (20 mg total) by mouth at bedtime. 90 tablet 3  . B Complex-C (B-COMPLEX WITH VITAMIN C) tablet Take 1 tablet by mouth daily.    . Cholecalciferol (VITAMIN D3) 5000 units CAPS Take 5,000 Units by mouth daily.     . DULoxetine HCl 40 MG CPEP Take 40 mg by mouth daily. 90 capsule 1  . levothyroxine (SYNTHROID, LEVOTHROID) 150 MCG tablet Take 1 tablet (150 mcg total) by mouth daily. 90 tablet 1  . metaxalone (SKELAXIN) 800 MG tablet Take 800 mg by mouth 3 (three) times daily as needed for muscle spasms.     Marland Kitchen sertraline (ZOLOFT) 100 MG tablet Take 1 tablet (100 mg total) by mouth at bedtime. 90 tablet 1  . SUMAtriptan (IMITREX) 100 MG tablet TAKE 1 TABLET BY MOUTH AS NEEDED. TAKE ALONG WITH 400MG  IBUPROFEN AT ONSET OF MIGRAINE. (Patient taking differently: Take 100 mg by mouth every 2 (two) hours as needed for migraine. ) 10 tablet 1  . TRELEGY ELLIPTA 100-62.5-25 MCG/INH AEPB Take 1 puff by mouth daily.   3  . Vitamin D, Ergocalciferol, (DRISDOL) 1.25 MG (50000 UT) CAPS capsule Take one tablet wkly (Patient taking differently: Take 50,000 Units by  mouth every 7 (seven) days. ) 12 capsule 1   No current facility-administered medications for this visit.     Allergies:   Iodinated diagnostic agents; Other; and Codeine    Social History:  The patient  reports that she has been smoking. She has a 7.50 pack-year smoking history. She has never used smokeless tobacco. She reports current alcohol use. She reports that she does not use drugs.   Family History:  The patient's family history includes CAD (age of onset: 61) in her brother; Cancer (age of onset: 11) in her maternal grandmother; Cancer (age of onset: 59) in her maternal aunt; Cancer (age of onset: 18) in her father; Depression in her mother; Healthy in her brother, brother, brother, and sister.    ROS:  Please see the history of  present illness.   Otherwise, review of systems are positive for none.   All other systems are reviewed and negative.    PHYSICAL EXAM: VS:  BP 130/80   Pulse 71   Ht 5' 6.5" (1.689 m)   Wt 164 lb (74.4 kg)   BMI 26.07 kg/m  , BMI Body mass index is 26.07 kg/m. GENERAL:  Well appearing HEENT:  Pupils equal round and reactive, fundi not visualized, oral mucosa unremarkable NECK:  No jugular venous distention, waveform within normal limits, carotid upstroke brisk and symmetric, no bruits, no thyromegaly LYMPHATICS:  No cervical, inguinal adenopathy LUNGS: No crackles, few expiratory wheezes BACK:  No CVA tenderness CHEST:  Unremarkable HEART:  PMI not displaced or sustained,S1 and S2 within normal limits, no S3, no S4, no clicks, no rubs, no murmurs ABD:  Flat, positive bowel sounds normal in frequency in pitch, no bruits, no rebound, no guarding, no midline pulsatile mass, no hepatomegaly, no splenomegaly EXT:  2 plus pulses throughout, no edema, no cyanosis no clubbing SKIN:  No rashes no nodules NEURO:  Cranial nerves II through XII grossly intact, motor grossly intact throughout PSYCH:  Cognitively intact, oriented to person place and time    EKG:  EKG is ordered today. The ekg ordered today demonstrates sinus rhythm, rate 71, axis within normal limits, intervals within normal limits, no acute ST-T wave changes.   Recent Labs: 03/11/2018: ALT 13; BUN 14; Creatinine, Ser 0.76; Hemoglobin 14.4; Platelets 271; Potassium 4.3; Sodium 141; TSH 3.440    Lipid Panel    Component Value Date/Time   CHOL 182 03/11/2018 0901   TRIG 79 03/11/2018 0901   HDL 56 03/11/2018 0901   CHOLHDL 3.3 03/11/2018 0901   CHOLHDL 2.8 12/15/2015 0835   VLDL 14 12/15/2015 0835   LDLCALC 110 (H) 03/11/2018 0901      Wt Readings from Last 3 Encounters:  04/22/18 164 lb (74.4 kg)  04/19/18 165 lb (74.8 kg)  04/08/18 165 lb 12.8 oz (75.2 kg)      Other studies Reviewed: Additional studies/  records that were reviewed today include: Labs. Review of the above records demonstrates:  Please see elsewhere in the note.     ASSESSMENT AND PLAN:  PREOP: The patient has no high risk physical findings or unstable symptoms.  She is going for a low risk study from a cardiovascular standpoint.  Therefore, according to ACC/AHA guidelines no further testing is indicated.  SOB: Shortness of breath is chronic and baseline probably related to her lung disease.  No change in therapy or further evaluation is indicated other than a BNP level.  I will check that today.  TOBACCO ABUSE: We discussed the  need to stop smoking completely.  DYSLIPIDEMIA: I did calculate her Framingham risk score.  It was 8.6.  She was given a prescription for Lipitor but would prefer not to take this.  At that level there is a suggestion that she could or could not take a statin based on patient preference but that she should at least proceed with therapeutic lifestyle changes.   Current medicines are reviewed at length with the patient today.  The patient does not have concerns regarding medicines.  The following changes have been made:  no change  Labs/ tests ordered today include:   Orders Placed This Encounter  Procedures  . B Nat Peptide  . EKG 12-Lead     Disposition:   FU with me as needed.      Signed, Minus Breeding, MD  04/22/2018 9:38 PM    [facility]

## 2018-04-22 ENCOUNTER — Ambulatory Visit (INDEPENDENT_AMBULATORY_CARE_PROVIDER_SITE_OTHER): Payer: Worker's Compensation | Admitting: Cardiology

## 2018-04-22 ENCOUNTER — Encounter: Payer: Self-pay | Admitting: Cardiology

## 2018-04-22 VITALS — BP 130/80 | HR 71 | Ht 66.5 in | Wt 164.0 lb

## 2018-04-22 DIAGNOSIS — R0602 Shortness of breath: Secondary | ICD-10-CM

## 2018-04-22 DIAGNOSIS — Z720 Tobacco use: Secondary | ICD-10-CM | POA: Diagnosis not present

## 2018-04-22 DIAGNOSIS — Z01810 Encounter for preprocedural cardiovascular examination: Secondary | ICD-10-CM | POA: Diagnosis not present

## 2018-04-22 DIAGNOSIS — E785 Hyperlipidemia, unspecified: Secondary | ICD-10-CM

## 2018-04-22 NOTE — Patient Instructions (Addendum)
Medication Instructions:  Continue current medications  If you need a refill on your cardiac medications before your next appointment, please call your pharmacy.  Labwork: BNP Today  Testing/Procedures: None Ordered   Follow-Up: . Your physician recommends that you schedule a follow-up appointment in: As Needed   At [facility], you and your health needs are our priority.  As part of our continuing mission to provide you with exceptional heart care, we have created designated Provider Care Teams.  These Care Teams include your primary Cardiologist (physician) and Advanced Practice Providers (APPs -  Physician Assistants and Nurse Practitioners) who all work together to provide you with the care you need, when you need it.  Thank you for choosing [facility] at [facility]!!

## 2018-04-23 ENCOUNTER — Encounter: Payer: Self-pay | Admitting: Family Medicine

## 2018-04-23 LAB — BRAIN NATRIURETIC PEPTIDE: BNP: 10.8 pg/mL (ref 0.0–100.0)

## 2018-04-25 NOTE — Progress Notes (Addendum)
PCP - Dr. Mellody Dance  Cardiologist - Dr Ardis Rowan- saw 04/22/2018 for evaluation of shortness of breath.  Pulmonary- Dr Loura Back- last office and chest x-ray requested  Chest x-ray - NA  EKG - 04/22/2018  Stress Test - no  ECHO - no  Cardiac Cath - no  Sleep Study - no- Dr Loura Back wants her to be tested CPAP -   LABS-  ASA-no Hold BC Powder HA1C-NA Fasting Blood Sugar - 0 Checks Blood Sugar _____0 times a day  Anesthesia- HX COPD, had frequent pneumonia until she started seeing Dr. Loura Back- has not had pneumonia since 2015. Instructed to not smoke 24 hours prior to surgery.  Pt denies having chest pain, sob, or fever at this time. All instructions explained to the pt, with a verbal understanding of the material. Pt agrees to go over the instructions while at home for a better understanding. The opportunity to ask questions was provided.

## 2018-04-25 NOTE — Pre-Procedure Instructions (Addendum)
[name]  04/25/2018     Your procedure is scheduled on Friday, March 6..  Report to [facility], Main Entrance or Entrance "A" at [facility]                        Your surgery or procedure is scheduled for 7:30 AM    Call this number if you have problems the morning of surgery: [phone]  This is the number for the Pre- Surgical Desk.     Remember:  Do not eat or drink after midnight Thursday, MARCH 5.                      Take these medicines the morning of surgery with A SIP OF WATER :  DULoxetine              levothyroxine (SYNTHROID, LEVOTHROID)              Use TRELEGY ELLIPTA     If needed may take: metaxalone (SKELAXIN)  SUMAtriptan (IMITREX) Use albuterol (PROVENTIL HFA;VENTOLIN HFA) inhaler and bring it in with you.  STOP taking Aspirin, Aspirin Products (Goody Powder, Excedrin Migraine), Ibuprofen (Advil), Naproxen (Aleve), Vitamins and Herbal Products (ie Fish Oil).  Special instructions:  DO NOT Smoke within 24 hours of Surgery  [facility]- Preparing For Surgery  Before surgery, you can play an important role. Because skin is not sterile, your skin needs to be as free of germs as possible. You can reduce the number of germs on your skin by washing with CHG (chlorahexidine gluconate) Soap before surgery.  CHG is an antiseptic cleaner which kills germs and bonds with the skin to continue killing germs even after washing.    Oral Hygiene is also important to reduce your risk of infection.  Remember - BRUSH YOUR TEETH THE MORNING OF SURGERY WITH YOUR REGULAR TOOTHPASTE  Please do not use if you have an allergy to CHG or antibacterial soaps. If your skin becomes reddened/irritated stop using the CHG.  Do not shave (including legs and underarms) for at least 48 hours prior to first CHG shower. It is OK to shave your face.  Please follow these instructions carefully.   1. Shower the NIGHT BEFORE SURGERY and the MORNING OF SURGERY with CHG.   2. If  you chose to wash your hair, wash your hair first as usual with your normal shampoo.  3. After you shampoo, wash your face and private area with the soap you use at home, then rinse your hair and body thoroughly to remove the shampoo and soap.  4. Use CHG as you would any other liquid soap. You can apply CHG directly to the skin and wash gently with a scrungie or a clean washcloth.   5. Apply the CHG Soap to your body ONLY FROM THE NECK DOWN.  Do not use on open wounds or open sores. Avoid contact with your eyes, ears, mouth and genitals (private parts).   6. Wash thoroughly, paying special attention to the area where your surgery will be performed.  7. Thoroughly rinse your body with warm water from the neck down.  8. DO NOT shower/wash with your normal soap after using and rinsing off the CHG Soap.  9. Pat yourself dry with a CLEAN TOWEL.  10. Wear CLEAN PAJAMAS to bed the night before surgery, wear comfortable clothes the morning of surgery  11. Place CLEAN SHEETS on your bed  the night of your first shower and DO NOT SLEEP WITH PETS.  Day of Surgery:  Shower as written above Do not apply any deodorants/lotions.  Please wear clean clothes to the hospital/surgery center.   Remember to brush your teeth WITH YOUR REGULAR TOOTHPASTE.             Do not wear jewelry, make-up or nail polish.  Do not wearpowders, or perfumes.  Do not shave 48 hours prior to surgery.  Men may shave face and neck.  Do not bring valuables to the hospital.  [facility] is not responsible for any belongings or valuables.  Contacts, dentures or bridgework may not be worn into surgery.  Leave your suitcase in the car.  After surgery it may be brought to your room.  For patients admitted to the hospital, discharge time will be determined by your treatment team.  Patients discharged the day of surgery will not be allowed to drive home.   Please read over the following fact sheets that you were given:  Managing  Pain, Surgical Site Infection, Coughing and Deep Breathing

## 2018-04-26 ENCOUNTER — Encounter (HOSPITAL_COMMUNITY)
Admission: RE | Admit: 2018-04-26 | Discharge: 2018-04-26 | Disposition: A | Discharge status: Home / Self Care | Payer: Worker's Compensation | Source: Ambulatory Visit | Attending: Orthopaedic Surgery | Admitting: Orthopaedic Surgery

## 2018-04-26 ENCOUNTER — Other Ambulatory Visit: Payer: Self-pay

## 2018-04-26 ENCOUNTER — Encounter (HOSPITAL_COMMUNITY): Payer: Self-pay

## 2018-04-26 DIAGNOSIS — Z01812 Encounter for preprocedural laboratory examination: Secondary | ICD-10-CM | POA: Diagnosis present

## 2018-04-26 HISTORY — DX: Other specified postprocedural states: Z98.890

## 2018-04-26 HISTORY — DX: Spinal stenosis, site unspecified: M48.00

## 2018-04-26 HISTORY — DX: Personal history of other medical treatment: Z92.89

## 2018-04-26 HISTORY — DX: Constipation, unspecified: K59.00

## 2018-04-26 HISTORY — DX: Pneumonia, unspecified organism: J18.9

## 2018-04-26 HISTORY — DX: Unspecified asthma, uncomplicated: J45.909

## 2018-04-26 HISTORY — DX: Other specified postprocedural states: R11.2

## 2018-04-26 HISTORY — DX: Personal history of urinary calculi: Z87.442

## 2018-04-26 HISTORY — DX: Dyspnea, unspecified: R06.00

## 2018-04-26 HISTORY — DX: Unspecified osteoarthritis, unspecified site: M19.90

## 2018-04-26 LAB — CBC
HCT: 49.5 % — ABNORMAL HIGH (ref 36.0–46.0)
Hemoglobin: 15.6 g/dL — ABNORMAL HIGH (ref 12.0–15.0)
MCH: 29.9 pg (ref 26.0–34.0)
MCHC: 31.5 g/dL (ref 30.0–36.0)
MCV: 94.8 fL (ref 80.0–100.0)
PLATELETS: 252 10*3/uL (ref 150–400)
RBC: 5.22 MIL/uL — ABNORMAL HIGH (ref 3.87–5.11)
RDW: 12.6 % (ref 11.5–15.5)
WBC: 8.4 10*3/uL (ref 4.0–10.5)
nRBC: 0 % (ref 0.0–0.2)

## 2018-04-26 LAB — COMPREHENSIVE METABOLIC PANEL
ALT: 17 U/L (ref 0–44)
AST: 19 U/L (ref 15–41)
Albumin: 4.4 g/dL (ref 3.5–5.0)
Alkaline Phosphatase: 74 U/L (ref 38–126)
Anion gap: 9 (ref 5–15)
BUN: 12 mg/dL (ref 6–20)
CALCIUM: 9.4 mg/dL (ref 8.9–10.3)
CO2: 24 mmol/L (ref 22–32)
Chloride: 108 mmol/L (ref 98–111)
Creatinine, Ser: 0.74 mg/dL (ref 0.44–1.00)
GFR calc Af Amer: >60 mL/min (ref >60)
GFR calc non Af Amer: >60 mL/min (ref >60)
Glucose, Bld: 94 mg/dL (ref 70–99)
Potassium: 4.1 mmol/L (ref 3.5–5.1)
Sodium: 141 mmol/L (ref 135–145)
Total Bilirubin: 0.2 mg/dL — ABNORMAL LOW (ref 0.3–1.2)
Total Protein: 6.7 g/dL (ref 6.5–8.1)

## 2018-04-26 LAB — TYPE AND SCREEN
ABO/RH(D): O POS
Antibody Screen: NEGATIVE

## 2018-04-26 LAB — URINALYSIS, ROUTINE W REFLEX MICROSCOPIC
Bilirubin Urine: NEGATIVE
GLUCOSE, UA: NEGATIVE mg/dL
Hgb urine dipstick: NEGATIVE
Ketones, ur: NEGATIVE mg/dL
Leukocytes,Ua: NEGATIVE
Nitrite: NEGATIVE
PH: 6 (ref 5.0–8.0)
PROTEIN: NEGATIVE mg/dL
Specific Gravity, Urine: 1.01 (ref 1.005–1.030)

## 2018-04-26 LAB — SURGICAL PCR SCREEN
MRSA, PCR: NEGATIVE
Staphylococcus aureus: NEGATIVE

## 2018-04-26 LAB — ABO/RH: ABO/RH(D): O POS

## 2018-04-29 ENCOUNTER — Telehealth (INDEPENDENT_AMBULATORY_CARE_PROVIDER_SITE_OTHER): Payer: Self-pay | Admitting: Orthopaedic Surgery

## 2018-04-29 NOTE — Telephone Encounter (Signed)
fyi

## 2018-04-29 NOTE — Progress Notes (Addendum)
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  History includes smoking, post-operative N/V, COPD, asthma, dyspnea, hypothyroidism, dyslipidemia, spinal stenosis, neuropathic pain. Last treated for PNA 2015.   Her PCP Dr. Raliegh Scarlet referred him to Dr. Percival Spanish for preoperative cardiology evaluation. According to 04/22/18 note, "The patient has no high risk physical findings or unstable symptoms.  She is going for a low risk study from a cardiovascular standpoint.  Therefore, according to ACC/AHA guidelines no further testing is indicated." He felt dyspnea was chronic and probably related to her lung disease. BNP was WNL. Smoking cessation recommended.  She seen pulmonologist Dr. Alcide Clever for COPD. Ambulatory O2 sats 88% 03/26/18, but patient has not been able to afford home O2. She was prescribed prednisone 20 mg daily with recommendations to follow-up in one month. Prednisone is not listed on her current medication record. Patient reports future sleep study has been recommended.   Reviewed above with anesthesiologist Oren Bracket, MD. Would recommend pulmonology provide preoperative input. I have notified Debbie at Dr. Lorin Mercy' office. I also called and left a voice message for nursing staff at Dr. Maxie Barb office regarding which surgery is planned and need to recommendations to best optimize patient for surgery and any post-operative recommendations.   ADDENDUM 05/02/18 4:44 PM: Patient was seen by pulmonologist Dr. Alcide Clever this afternoon at 3:30 PM. He cleared patient at "moderately high risk for general surgery due to respiratory issues." He recommended: - Give a dose  of solumedrol 125 mg IV a few hours before surgery - Give a nebulizer treatment an hour before and after anesthesia - Give the lowest oxygen (post surgery) to keep the pulse ox between 90-92% - Patient may need BiPAP after anesthesia - Don't hesitate to contact Dr. Alcide Clever if needed ([phone])    VS: BP 121/82   Pulse 88   Temp 36.9 C (Oral)   Resp 18   Ht 5\' 6"  (1.676 m)   Wt 74.4 kg   BMI 26.48 kg/m    PROVIDERS: Mellody Dance, DO is PCP. She saw patient on 04/08/10 and recommended referral to cardiology for nuclear stress test for cardiovascular clearance, but Dr. Percival Spanish did not feel preoperative cardiac testing was warranted prior to surgery.  - Georgeanna Lea, MD is cardiologist - Gardiner Rhyme, MD is pulmonologist [facility]). Last visit 03/26/18 with Bobbye Riggs, NP. Still smoking 1-2 cigarettes, could not afford home O2, so using rescue inhaler to help with symptoms of dry coughing and occasional wheezing. Prednisone daily added with one month follow-up (no longer on prednisone by med list). Trelegy, nebs, and PRN rescue inhaler continued. NP also reached out to another agency to look into other home O2 options. Smoking cessation recommended.     LABS: Labs reviewed: Acceptable for surgery. (all labs ordered are listed, but only abnormal results are displayed)  Labs Reviewed  CBC - Abnormal; Notable for the following components:      Result Value   RBC 5.22 (*)    Hemoglobin 15.6 (*)    HCT 49.5 (*)    All other components within normal limits  COMPREHENSIVE  METABOLIC PANEL - Abnormal; Notable for the following components:   Total Bilirubin 0.2 (*)    All other components within normal limits  SURGICAL PCR SCREEN  URINALYSIS, ROUTINE W REFLEX MICROSCOPIC  TYPE AND SCREEN  ABO/RH    Spirometry 12/11/17 [facility]): FVC 2.00 (60%), post 2.05 (62%). FEV1 1.16 (42%), post 1.14 (42%).  FEV1/FVC 58% (79%), post 56$  (70%) DLCO 4.77 (19%).   6 Minute Walk Test 03/26/18 [facility]): - oxygen while resting on room air - 93% - oxygen while walking on room air - 88% - oxygen while walking on 2L - 96% (Home oxygen recommended, but patient could not pay $500 deductible required by [employer]. Dr. Alcide Clever reached out to [facility] and would reach out to patient if eligible for any program they offered.)      IMAGES: CT Lumbar Myelogram 04/10/17: IMPRESSION: LUMBAR MYELOGRAM IMPRESSION: - Dynamic instability at L4-5, with anterolisthesis increasing to a maximum of 7 mm with patient standing in flexion. - Asymmetric subarticular zone narrowing at L4-5 on the RIGHT, with RIGHT L5 nerve root impingement most apparent with patient standing on AP view. CT LUMBAR MYELOGRAM IMPRESSION: - Central protrusion at L4-5. BILATERAL L5 nerve root effacement due to 2 mm slip, and posterior element hypertrophy. Either L5 nerve root could be affected.   EKG: 04/22/18: NSR   CV: N/A   Past Medical History:  Diagnosis Date  . Anxiety   . Arthritis   . Asthma due to seasonal allergies   . Constipation   . COPD (chronic obstructive pulmonary disease) ([place])   . Depression   . Dyslipidemia   . Dyspnea   . History of blood transfusion   . History of kidney stones    several passed  . Hypothyroidism   . Migraine    Migraines  . Neuropathic pain   . Pneumonia    frequent - last time 2015 ish  . PONV (postoperative nausea and vomiting)   . Spinal stenosis     Past Surgical History:  Procedure Laterality Date  . ABDOMINAL HYSTERECTOMY     total  . APPENDECTOMY    . CHOLECYSTECTOMY    . COLONOSCOPY      MEDICATIONS: . bisacodyl (DULCOLAX) 5 MG EC tablet  . calcium carbonate (TUMS - DOSED IN MG ELEMENTAL CALCIUM) 500 MG chewable tablet  . omeprazole (PRILOSEC) 20 MG capsule  . albuterol (PROVENTIL HFA;VENTOLIN HFA) 108 (90 Base) MCG/ACT inhaler  . Aspirin-Salicylamide-Caffeine (BC HEADACHE) 325-95-16 MG  TABS  . atorvastatin (LIPITOR) 20 MG tablet  . B Complex-C (B-COMPLEX WITH VITAMIN C) tablet  . Cholecalciferol (VITAMIN D3) 5000 units CAPS  . DULoxetine HCl 40 MG CPEP  . levothyroxine (SYNTHROID, LEVOTHROID) 150 MCG tablet  . metaxalone (SKELAXIN) 800 MG tablet  . sertraline (ZOLOFT) 100 MG tablet  . SUMAtriptan (IMITREX) 100 MG tablet  . TRELEGY ELLIPTA 100-62.5-25 MCG/INH AEPB  . Vitamin D, Ergocalciferol, (DRISDOL) 1.25 MG (50000 UT) CAPS capsule   No current facility-administered medications for this encounter.     Myra Gianotti, PA-C Surgical Short Stay/Anesthesiology [facility] Phone [phone] [facility] Phone [phone] 04/29/2018 4:22 PM

## 2018-04-29 NOTE — Telephone Encounter (Signed)
Thank you so much

## 2018-04-29 NOTE — Telephone Encounter (Signed)
Medical clearance request (from a pulmonary standpoint) for Lumbar Fusion Surgery scheduled 05-03-18 was faxed to [phone] to the ATTN of Dr. Gardiner Rhyme.  I made several calls to the office , but was unable to speak directly to a staff member.  A detailed message was left on voicemail, providing surgery date and the need for an urgent response.

## 2018-05-01 ENCOUNTER — Encounter (INDEPENDENT_AMBULATORY_CARE_PROVIDER_SITE_OTHER): Payer: Self-pay | Admitting: Orthopaedic Surgery

## 2018-05-01 ENCOUNTER — Encounter (INDEPENDENT_AMBULATORY_CARE_PROVIDER_SITE_OTHER): Payer: Self-pay

## 2018-05-01 ENCOUNTER — Telehealth (INDEPENDENT_AMBULATORY_CARE_PROVIDER_SITE_OTHER): Payer: Self-pay | Admitting: Orthopaedic Surgery

## 2018-05-01 NOTE — Telephone Encounter (Signed)
Patient must have pulmonary clearance prior to Lumbar Fusion.  Surgery is scheduled March 6th.  I called Dr. Maxie Barb office today and spoke with him directly.  He stated that several attempts have been made to contact the patient as voicemail messages were left, but no response. Patient was given steroids and asked to return for a 1 month follow up, but she did not show for the appointment. Dr. Alcide Clever said that patient can call and make an appointment or just show up today or tomorrow, but he is not comfortable with providing her a clearance without her being seen.  I left a message on patient's voicemail and also sent her a message through my chart detailing what is needed in order to proceed with her surgery.

## 2018-05-01 NOTE — Telephone Encounter (Signed)
Fyi.

## 2018-05-02 ENCOUNTER — Encounter (HOSPITAL_COMMUNITY): Payer: Self-pay | Admitting: Anesthesiology

## 2018-05-02 ENCOUNTER — Telehealth (INDEPENDENT_AMBULATORY_CARE_PROVIDER_SITE_OTHER): Payer: Self-pay | Admitting: Radiology

## 2018-05-02 ENCOUNTER — Encounter (INDEPENDENT_AMBULATORY_CARE_PROVIDER_SITE_OTHER): Payer: Self-pay | Admitting: Orthopaedic Surgery

## 2018-05-02 NOTE — Anesthesia Preprocedure Evaluation (Addendum)
Anesthesia Evaluation  Patient identified by MRN, date of birth, ID band Patient awake    Reviewed: Allergy & Precautions, NPO status , Patient's Chart, lab work & pertinent test results  History of Anesthesia Complications (+) PONV and history of anesthetic complications  Airway Mallampati: II  TM Distance: >3 FB Neck ROM: Full    Dental  (+) Teeth Intact   Pulmonary shortness of breath and with exertion, asthma , pneumonia, resolved, COPD,  COPD inhaler, Current Smoker,   Increased AP diameter  breath sounds clear to auscultation + decreased breath sounds      Cardiovascular negative cardio ROS Normal cardiovascular exam Rhythm:Regular Rate:Normal     Neuro/Psych  Headaches, PSYCHIATRIC DISORDERS Anxiety Depression  Neuromuscular disease    GI/Hepatic GERD  Medicated and Controlled,(+)     substance abuse  alcohol use,   Endo/Other  Hypothyroidism Hyperlipidemia Fibrocystic breast disease  Renal/GU Hx/o renal calculi  negative genitourinary   Musculoskeletal  (+) Arthritis , Osteoarthritis,  Low Back pain with radiculopathy 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 Lumbar spinal stenosis   Abdominal   Peds  Hematology  (+) anemia ,   Anesthesia Other Findings   Reproductive/Obstetrics                          Anesthesia Physical Anesthesia Plan  ASA: III  Anesthesia Plan: General   Post-op Pain Management:    Induction: Intravenous  PONV Risk Score and Plan: 4 or greater and Scopolamine patch - Pre-op, Midazolam, Ondansetron, Dexamethasone and Treatment may vary due to age or medical condition  Airway Management Planned: Oral ETT  Additional Equipment:   Intra-op Plan:   Post-operative Plan: Extubation in OR  Informed Consent: I have reviewed the patients History and Physical, chart, labs and discussed the procedure including the risks, benefits and alternatives for the proposed anesthesia with  the patient or authorized representative who has indicated his/her understanding and acceptance.     Dental advisory given  Plan Discussed with: CRNA and Surgeon  Anesthesia Plan Comments: (See PAT note written by Myra Gianotti, PA-C. Has cardiology (Dr. Percival Spanish) and pulmonology (Dr. Alcide Clever) input. Per Dr. Alcide Clever recommendations: - Give a dose of solumedrol 125 mg IV a few hours before surgery - Give a nebulizer treatment an hour before and after anesthesia - Give the lowest oxygen (post surgery) to keep the pulse ox between 90-92% - Patient may need BiPAP after anesthesia)      Anesthesia Quick Evaluation

## 2018-05-02 NOTE — Telephone Encounter (Signed)
I called has appt today at 3;30 PM she will make sure info sent to our office before 5 PM

## 2018-05-02 NOTE — Telephone Encounter (Signed)
Patient's pulmonologist Dr  Gardiner Rhyme  has cleared patient for Lumbar Fusion from a pulmonary standpoint.  Office visit notes from the visit with him today has been scanned into the media tab by Abigail Butts and faxed to short stay.  Ebony Hail with anesthesiology has been contacted and has confirmed receipt. Patient is moderate high risk for general surgery due to respiratory issues.

## 2018-05-03 ENCOUNTER — Encounter (HOSPITAL_COMMUNITY): Payer: Self-pay

## 2018-05-03 ENCOUNTER — Inpatient Hospital Stay (HOSPITAL_COMMUNITY)
Admission: RE | Admit: 2018-05-03 | Discharge: 2018-05-04 | DRG: 455 | Disposition: A | Discharge status: Home Health | Payer: Worker's Compensation | Attending: Orthopaedic Surgery | Admitting: Orthopaedic Surgery

## 2018-05-03 ENCOUNTER — Other Ambulatory Visit: Payer: Self-pay

## 2018-05-03 ENCOUNTER — Encounter (INDEPENDENT_AMBULATORY_CARE_PROVIDER_SITE_OTHER): Payer: Self-pay | Admitting: Orthopaedic Surgery

## 2018-05-03 ENCOUNTER — Inpatient Hospital Stay (HOSPITAL_COMMUNITY): Payer: Worker's Compensation | Admitting: Anesthesiology

## 2018-05-03 ENCOUNTER — Encounter (HOSPITAL_COMMUNITY)
Admission: RE | Disposition: A | Discharge status: Home Health | Payer: Self-pay | Source: Home / Self Care | Attending: Orthopaedic Surgery

## 2018-05-03 ENCOUNTER — Inpatient Hospital Stay (HOSPITAL_COMMUNITY): Payer: Worker's Compensation

## 2018-05-03 ENCOUNTER — Inpatient Hospital Stay (HOSPITAL_COMMUNITY): Payer: Worker's Compensation | Admitting: Vascular Surgery

## 2018-05-03 DIAGNOSIS — Z8701 Personal history of pneumonia (recurrent): Secondary | ICD-10-CM

## 2018-05-03 DIAGNOSIS — Z8249 Family history of ischemic heart disease and other diseases of the circulatory system: Secondary | ICD-10-CM | POA: Diagnosis not present

## 2018-05-03 DIAGNOSIS — M4316 Spondylolisthesis, lumbar region: Secondary | ICD-10-CM | POA: Diagnosis present

## 2018-05-03 DIAGNOSIS — F17210 Nicotine dependence, cigarettes, uncomplicated: Secondary | ICD-10-CM | POA: Diagnosis present

## 2018-05-03 DIAGNOSIS — K219 Gastro-esophageal reflux disease without esophagitis: Secondary | ICD-10-CM | POA: Diagnosis present

## 2018-05-03 DIAGNOSIS — J302 Other seasonal allergic rhinitis: Secondary | ICD-10-CM | POA: Diagnosis present

## 2018-05-03 DIAGNOSIS — Z419 Encounter for procedure for purposes other than remedying health state, unspecified: Secondary | ICD-10-CM

## 2018-05-03 DIAGNOSIS — Z800 Family history of malignant neoplasm of digestive organs: Secondary | ICD-10-CM | POA: Diagnosis not present

## 2018-05-03 DIAGNOSIS — E785 Hyperlipidemia, unspecified: Secondary | ICD-10-CM | POA: Diagnosis present

## 2018-05-03 DIAGNOSIS — Z87442 Personal history of urinary calculi: Secondary | ICD-10-CM

## 2018-05-03 DIAGNOSIS — M532X6 Spinal instabilities, lumbar region: Secondary | ICD-10-CM | POA: Diagnosis present

## 2018-05-03 DIAGNOSIS — E039 Hypothyroidism, unspecified: Secondary | ICD-10-CM | POA: Diagnosis present

## 2018-05-03 DIAGNOSIS — Z90710 Acquired absence of both cervix and uterus: Secondary | ICD-10-CM | POA: Diagnosis not present

## 2018-05-03 DIAGNOSIS — Z79899 Other long term (current) drug therapy: Secondary | ICD-10-CM | POA: Diagnosis not present

## 2018-05-03 DIAGNOSIS — M5126 Other intervertebral disc displacement, lumbar region: Secondary | ICD-10-CM | POA: Diagnosis present

## 2018-05-03 DIAGNOSIS — J449 Chronic obstructive pulmonary disease, unspecified: Secondary | ICD-10-CM | POA: Diagnosis present

## 2018-05-03 DIAGNOSIS — Z7951 Long term (current) use of inhaled steroids: Secondary | ICD-10-CM | POA: Diagnosis not present

## 2018-05-03 DIAGNOSIS — Z803 Family history of malignant neoplasm of breast: Secondary | ICD-10-CM

## 2018-05-03 DIAGNOSIS — F419 Anxiety disorder, unspecified: Secondary | ICD-10-CM | POA: Diagnosis present

## 2018-05-03 DIAGNOSIS — F329 Major depressive disorder, single episode, unspecified: Secondary | ICD-10-CM | POA: Diagnosis present

## 2018-05-03 DIAGNOSIS — Z79890 Hormone replacement therapy (postmenopausal): Secondary | ICD-10-CM | POA: Diagnosis not present

## 2018-05-03 DIAGNOSIS — M48061 Spinal stenosis, lumbar region without neurogenic claudication: Principal | ICD-10-CM | POA: Diagnosis present

## 2018-05-03 SURGERY — POSTERIOR LUMBAR FUSION 1 LEVEL
Anesthesia: General | Site: Spine Lumbar

## 2018-05-03 MED ORDER — LACTATED RINGERS IV SOLN
INTRAVENOUS | Status: DC | PRN
  Administered 2018-05-03 (×2): via INTRAVENOUS

## 2018-05-03 MED ORDER — SUMATRIPTAN SUCCINATE 100 MG PO TABS
100.0000 mg | ORAL_TABLET | ORAL | Status: DC | PRN
  Filled 2018-05-03: qty 1

## 2018-05-03 MED ORDER — VITAMIN D 25 MCG (1000 UNIT) PO TABS
5000.0000 [IU] | ORAL_TABLET | Freq: Every day | ORAL | Status: DC
  Administered 2018-05-04: 5000 [IU] via ORAL
  Filled 2018-05-03 (×2): qty 5

## 2018-05-03 MED ORDER — PANTOPRAZOLE SODIUM 40 MG PO TBEC
40.0000 mg | DELAYED_RELEASE_TABLET | Freq: Every day | ORAL | Status: DC
  Administered 2018-05-03 – 2018-05-04 (×2): 40 mg via ORAL
  Filled 2018-05-03 (×2): qty 1

## 2018-05-03 MED ORDER — B COMPLEX-C PO TABS
1.0000 | ORAL_TABLET | Freq: Every day | ORAL | Status: DC
  Administered 2018-05-03 – 2018-05-04 (×2): 1 via ORAL
  Filled 2018-05-03 (×2): qty 1

## 2018-05-03 MED ORDER — ALBUTEROL SULFATE (2.5 MG/3ML) 0.083% IN NEBU: 2.5000 mg | INHALATION_SOLUTION | Freq: Four times a day (QID) | RESPIRATORY_TRACT | Status: DC | PRN

## 2018-05-03 MED ORDER — HEMOSTATIC AGENTS (NO CHARGE) OPTIME
TOPICAL | Status: DC | PRN
  Administered 2018-05-03: 1

## 2018-05-03 MED ORDER — CEFAZOLIN SODIUM-DEXTROSE 1-4 GM/50ML-% IV SOLN
1.0000 g | Freq: Three times a day (TID) | INTRAVENOUS | Status: AC
  Administered 2018-05-03 – 2018-05-04 (×2): 1 g via INTRAVENOUS
  Filled 2018-05-03 (×2): qty 50

## 2018-05-03 MED ORDER — PHENOL 1.4 % MT LIQD: 1.0000 | OROMUCOSAL | Status: DC | PRN

## 2018-05-03 MED ORDER — SUGAMMADEX SODIUM 200 MG/2ML IV SOLN
INTRAVENOUS | Status: DC | PRN
  Administered 2018-05-03: 200 mg via INTRAVENOUS

## 2018-05-03 MED ORDER — SODIUM CHLORIDE 0.9% FLUSH: 3.0000 mL | Freq: Two times a day (BID) | INTRAVENOUS | Status: DC

## 2018-05-03 MED ORDER — VITAMIN D (ERGOCALCIFEROL) 1.25 MG (50000 UNIT) PO CAPS: 50000.0000 [IU] | ORAL_CAPSULE | ORAL | Status: DC

## 2018-05-03 MED ORDER — KETOROLAC TROMETHAMINE 30 MG/ML IJ SOLN
INTRAMUSCULAR | Status: AC
  Filled 2018-05-03: qty 1

## 2018-05-03 MED ORDER — CEFAZOLIN SODIUM 1 G IJ SOLR
INTRAMUSCULAR | Status: AC
  Filled 2018-05-03: qty 20

## 2018-05-03 MED ORDER — SODIUM CHLORIDE 0.9 % IV SOLN: 250.0000 mL | INTRAVENOUS | Status: DC

## 2018-05-03 MED ORDER — METAXALONE 800 MG PO TABS
800.0000 mg | ORAL_TABLET | Freq: Three times a day (TID) | ORAL | Status: DC | PRN
  Administered 2018-05-03: 800 mg via ORAL
  Filled 2018-05-03 (×2): qty 1

## 2018-05-03 MED ORDER — PROPOFOL 10 MG/ML IV BOLUS
INTRAVENOUS | Status: DC | PRN
  Administered 2018-05-03: 110 mg via INTRAVENOUS

## 2018-05-03 MED ORDER — DEXMEDETOMIDINE HCL IN NACL 200 MCG/50ML IV SOLN
INTRAVENOUS | Status: AC
  Filled 2018-05-03: qty 50

## 2018-05-03 MED ORDER — DEXAMETHASONE SODIUM PHOSPHATE 10 MG/ML IJ SOLN
INTRAMUSCULAR | Status: AC
  Filled 2018-05-03: qty 1

## 2018-05-03 MED ORDER — MIDAZOLAM HCL 5 MG/5ML IJ SOLN
INTRAMUSCULAR | Status: DC | PRN
  Administered 2018-05-03: 2 mg via INTRAVENOUS

## 2018-05-03 MED ORDER — MIDAZOLAM HCL 2 MG/2ML IJ SOLN
INTRAMUSCULAR | Status: AC
  Filled 2018-05-03: qty 2

## 2018-05-03 MED ORDER — DEXMEDETOMIDINE HCL IN NACL 200 MCG/50ML IV SOLN
INTRAVENOUS | Status: DC | PRN
  Administered 2018-05-03 (×5): 4 ug via INTRAVENOUS

## 2018-05-03 MED ORDER — LEVOTHYROXINE SODIUM 75 MCG PO TABS
150.0000 ug | ORAL_TABLET | Freq: Every day | ORAL | Status: DC
  Administered 2018-05-03 – 2018-05-04 (×2): 150 ug via ORAL
  Filled 2018-05-03 (×2): qty 2

## 2018-05-03 MED ORDER — ONDANSETRON HCL 4 MG/2ML IJ SOLN
4.0000 mg | Freq: Four times a day (QID) | INTRAMUSCULAR | Status: DC | PRN
  Administered 2018-05-03: 4 mg via INTRAVENOUS
  Filled 2018-05-03: qty 2

## 2018-05-03 MED ORDER — PROPOFOL 10 MG/ML IV BOLUS
INTRAVENOUS | Status: AC
  Filled 2018-05-03: qty 20

## 2018-05-03 MED ORDER — CHLORHEXIDINE GLUCONATE 4 % EX LIQD: 60.0000 mL | Freq: Once | CUTANEOUS | Status: DC

## 2018-05-03 MED ORDER — ROCURONIUM BROMIDE 50 MG/5ML IV SOSY
PREFILLED_SYRINGE | INTRAVENOUS | Status: DC | PRN
  Administered 2018-05-03: 50 mg via INTRAVENOUS
  Administered 2018-05-03: 10 mg via INTRAVENOUS
  Administered 2018-05-03 (×2): 20 mg via INTRAVENOUS

## 2018-05-03 MED ORDER — BUPIVACAINE HCL (PF) 0.5 % IJ SOLN
INTRAMUSCULAR | Status: AC
  Filled 2018-05-03: qty 30

## 2018-05-03 MED ORDER — METOCLOPRAMIDE HCL 5 MG/ML IJ SOLN: 10.0000 mg | Freq: Once | INTRAMUSCULAR | Status: DC | PRN

## 2018-05-03 MED ORDER — SODIUM CHLORIDE 0.9% FLUSH: 3.0000 mL | INTRAVENOUS | Status: DC | PRN

## 2018-05-03 MED ORDER — MENTHOL 3 MG MT LOZG: 1.0000 | LOZENGE | OROMUCOSAL | Status: DC | PRN

## 2018-05-03 MED ORDER — FLUTICASONE-UMECLIDIN-VILANT 100-62.5-25 MCG/INH IN AEPB: 1.0000 | INHALATION_SPRAY | Freq: Every day | RESPIRATORY_TRACT | Status: DC

## 2018-05-03 MED ORDER — ACETAMINOPHEN 325 MG PO TABS
650.0000 mg | ORAL_TABLET | ORAL | Status: DC | PRN
  Administered 2018-05-04: 650 mg via ORAL
  Filled 2018-05-03: qty 2

## 2018-05-03 MED ORDER — FENTANYL CITRATE (PF) 250 MCG/5ML IJ SOLN
INTRAMUSCULAR | Status: AC
  Filled 2018-05-03: qty 5

## 2018-05-03 MED ORDER — 0.9 % SODIUM CHLORIDE (POUR BTL) OPTIME
TOPICAL | Status: DC | PRN
  Administered 2018-05-03: 1000 mL

## 2018-05-03 MED ORDER — ONDANSETRON HCL 4 MG/2ML IJ SOLN
INTRAMUSCULAR | Status: DC | PRN
  Administered 2018-05-03: 4 mg via INTRAVENOUS

## 2018-05-03 MED ORDER — SCOPOLAMINE 1 MG/3DAYS TD PT72
MEDICATED_PATCH | TRANSDERMAL | Status: DC | PRN
  Administered 2018-05-03: 1 via TRANSDERMAL

## 2018-05-03 MED ORDER — BISACODYL 5 MG PO TBEC: 5.0000 mg | DELAYED_RELEASE_TABLET | Freq: Every day | ORAL | Status: DC | PRN

## 2018-05-03 MED ORDER — FENTANYL CITRATE (PF) 100 MCG/2ML IJ SOLN
INTRAMUSCULAR | Status: DC | PRN
  Administered 2018-05-03 (×3): 50 ug via INTRAVENOUS
  Administered 2018-05-03: 25 ug via INTRAVENOUS
  Administered 2018-05-03: 150 ug via INTRAVENOUS

## 2018-05-03 MED ORDER — ATORVASTATIN CALCIUM 10 MG PO TABS
20.0000 mg | ORAL_TABLET | Freq: Every day | ORAL | Status: DC
  Administered 2018-05-03: 20 mg via ORAL
  Filled 2018-05-03: qty 2

## 2018-05-03 MED ORDER — SODIUM CHLORIDE (PF) 0.9 % IJ SOLN
INTRAMUSCULAR | Status: AC
  Filled 2018-05-03: qty 10

## 2018-05-03 MED ORDER — DIPHENHYDRAMINE HCL 25 MG PO CAPS
25.0000 mg | ORAL_CAPSULE | Freq: Four times a day (QID) | ORAL | Status: DC | PRN
  Administered 2018-05-03 – 2018-05-04 (×2): 25 mg via ORAL
  Filled 2018-05-03 (×2): qty 1

## 2018-05-03 MED ORDER — KETOROLAC TROMETHAMINE 30 MG/ML IJ SOLN
30.0000 mg | Freq: Once | INTRAMUSCULAR | Status: AC
  Administered 2018-05-03: 30 mg via INTRAVENOUS

## 2018-05-03 MED ORDER — THROMBIN (RECOMBINANT) 20000 UNITS EX SOLR
CUTANEOUS | Status: AC
  Filled 2018-05-03: qty 20000

## 2018-05-03 MED ORDER — SCOPOLAMINE 1 MG/3DAYS TD PT72
MEDICATED_PATCH | TRANSDERMAL | Status: AC
  Filled 2018-05-03: qty 1

## 2018-05-03 MED ORDER — MEPERIDINE HCL 50 MG/ML IJ SOLN: 6.2500 mg | INTRAMUSCULAR | Status: DC | PRN

## 2018-05-03 MED ORDER — ALBUTEROL SULFATE HFA 108 (90 BASE) MCG/ACT IN AERS: 1.0000 | INHALATION_SPRAY | Freq: Four times a day (QID) | RESPIRATORY_TRACT | Status: DC | PRN

## 2018-05-03 MED ORDER — DEXAMETHASONE SODIUM PHOSPHATE 10 MG/ML IJ SOLN
INTRAMUSCULAR | Status: DC | PRN
  Administered 2018-05-03: 4 mg via INTRAVENOUS

## 2018-05-03 MED ORDER — HYDROCODONE-ACETAMINOPHEN 10-325 MG PO TABS
ORAL_TABLET | ORAL | Status: AC
  Filled 2018-05-03: qty 2

## 2018-05-03 MED ORDER — LIDOCAINE 2% (20 MG/ML) 5 ML SYRINGE
INTRAMUSCULAR | Status: DC | PRN
  Administered 2018-05-03: 80 mg via INTRAVENOUS
  Administered 2018-05-03: 20 mg via INTRAVENOUS

## 2018-05-03 MED ORDER — DULOXETINE HCL 20 MG PO CPEP
40.0000 mg | ORAL_CAPSULE | Freq: Every day | ORAL | Status: DC
  Administered 2018-05-03 – 2018-05-04 (×2): 40 mg via ORAL
  Filled 2018-05-03 (×2): qty 2

## 2018-05-03 MED ORDER — FLUTICASONE FUROATE-VILANTEROL 100-25 MCG/INH IN AEPB
1.0000 | INHALATION_SPRAY | Freq: Every day | RESPIRATORY_TRACT | Status: DC
  Filled 2018-05-03: qty 28

## 2018-05-03 MED ORDER — HYDROMORPHONE HCL 1 MG/ML IJ SOLN: 0.2500 mg | INTRAMUSCULAR | Status: DC | PRN

## 2018-05-03 MED ORDER — CEFAZOLIN SODIUM-DEXTROSE 2-4 GM/100ML-% IV SOLN
2.0000 g | INTRAVENOUS | Status: AC
  Administered 2018-05-03 (×2): 2 g via INTRAVENOUS
  Filled 2018-05-03: qty 100

## 2018-05-03 MED ORDER — ONDANSETRON HCL 4 MG PO TABS
4.0000 mg | ORAL_TABLET | Freq: Four times a day (QID) | ORAL | Status: DC | PRN
  Administered 2018-05-04: 4 mg via ORAL
  Filled 2018-05-03: qty 1

## 2018-05-03 MED ORDER — CALCIUM CARBONATE ANTACID 500 MG PO CHEW: 1.0000 | CHEWABLE_TABLET | Freq: Every day | ORAL | Status: DC | PRN

## 2018-05-03 MED ORDER — BUPIVACAINE HCL (PF) 0.5 % IJ SOLN
INTRAMUSCULAR | Status: DC | PRN
  Administered 2018-05-03: 10 mL

## 2018-05-03 MED ORDER — ONDANSETRON HCL 4 MG/2ML IJ SOLN
INTRAMUSCULAR | Status: AC
  Filled 2018-05-03: qty 2

## 2018-05-03 MED ORDER — SODIUM CHLORIDE 0.9 % IV SOLN
INTRAVENOUS | Status: DC | PRN
  Administered 2018-05-03: 15 ug/min via INTRAVENOUS

## 2018-05-03 MED ORDER — ACETAMINOPHEN 650 MG RE SUPP: 650.0000 mg | RECTAL | Status: DC | PRN

## 2018-05-03 MED ORDER — METHYLPREDNISOLONE SODIUM SUCC 125 MG IJ SOLR
INTRAMUSCULAR | Status: DC | PRN
  Administered 2018-05-03: 125 mg via INTRAVENOUS

## 2018-05-03 MED ORDER — HYDROCODONE-ACETAMINOPHEN 10-325 MG PO TABS
2.0000 | ORAL_TABLET | ORAL | Status: DC | PRN
  Administered 2018-05-03 – 2018-05-04 (×4): 2 via ORAL
  Filled 2018-05-03 (×3): qty 2

## 2018-05-03 MED ORDER — SODIUM CHLORIDE 0.9 % IV SOLN
INTRAVENOUS | Status: DC
  Administered 2018-05-03: 14:00:00 via INTRAVENOUS

## 2018-05-03 MED ORDER — UMECLIDINIUM BROMIDE 62.5 MCG/INH IN AEPB
1.0000 | INHALATION_SPRAY | Freq: Every day | RESPIRATORY_TRACT | Status: DC
  Filled 2018-05-03: qty 7

## 2018-05-03 MED ORDER — THROMBIN 20000 UNITS EX SOLR
CUTANEOUS | Status: DC | PRN
  Administered 2018-05-03: 20000 [IU] via TOPICAL

## 2018-05-03 MED ORDER — SERTRALINE HCL 100 MG PO TABS
100.0000 mg | ORAL_TABLET | Freq: Every day | ORAL | Status: DC
  Administered 2018-05-03: 100 mg via ORAL
  Filled 2018-05-03: qty 1

## 2018-05-03 SURGICAL SUPPLY — 65 items
BUR ROUND FLUTED 4 SOFT TCH (BURR) ×2 IMPLANT
BUR ROUND FLUTED 4MM SOFT TCH (BURR) ×1
CABLE BIPOLOR RESECTION CORD (MISCELLANEOUS) ×3 IMPLANT
CAP SPINAL LOCKING TI (Cap) ×12 IMPLANT
COVER BACK TABLE 80X110 HD (DRAPES) ×3 IMPLANT
COVER MAYO STAND STRL (DRAPES) ×3 IMPLANT
COVER SURGICAL LIGHT HANDLE (MISCELLANEOUS) ×3 IMPLANT
COVER WAND RF STERILE (DRAPES) ×3 IMPLANT
DERMABOND ADVANCED (GAUZE/BANDAGES/DRESSINGS) ×2
DERMABOND ADVANCED .7 DNX12 (GAUZE/BANDAGES/DRESSINGS) ×1 IMPLANT
DRAPE C-ARM 42X72 X-RAY (DRAPES) ×3 IMPLANT
DRAPE HALF SHEET 40X57 (DRAPES) ×6 IMPLANT
DRAPE LAPAROTOMY T 102X78X121 (DRAPES) ×3 IMPLANT
DRAPE MICROSCOPE LEICA (MISCELLANEOUS) ×3 IMPLANT
DRAPE SURG 17X23 STRL (DRAPES) ×9 IMPLANT
DRSG EMULSION OIL 3X3 NADH (GAUZE/BANDAGES/DRESSINGS) ×3 IMPLANT
DRSG MEPILEX BORDER 4X4 (GAUZE/BANDAGES/DRESSINGS) ×3 IMPLANT
DRSG MEPILEX BORDER 4X8 (GAUZE/BANDAGES/DRESSINGS) ×3 IMPLANT
DRSG PAD ABDOMINAL 8X10 ST (GAUZE/BANDAGES/DRESSINGS) ×3 IMPLANT
DURAPREP 26ML APPLICATOR (WOUND CARE) ×3 IMPLANT
ELECT BLADE 4.0 EZ CLEAN MEGAD (MISCELLANEOUS) ×3
ELECT CAUTERY BLADE 6.4 (BLADE) ×3 IMPLANT
ELECT REM PT RETURN 9FT ADLT (ELECTROSURGICAL) ×3
ELECTRODE BLDE 4.0 EZ CLN MEGD (MISCELLANEOUS) ×1 IMPLANT
ELECTRODE REM PT RTRN 9FT ADLT (ELECTROSURGICAL) ×1 IMPLANT
GLOVE BIOGEL PI IND STRL 8 (GLOVE) ×2 IMPLANT
GLOVE BIOGEL PI INDICATOR 8 (GLOVE) ×4
GLOVE ORTHO TXT STRL SZ7.5 (GLOVE) ×6 IMPLANT
GOWN STRL REUS W/ TWL LRG LVL3 (GOWN DISPOSABLE) ×1 IMPLANT
GOWN STRL REUS W/ TWL XL LVL3 (GOWN DISPOSABLE) ×1 IMPLANT
GOWN STRL REUS W/TWL 2XL LVL3 (GOWN DISPOSABLE) ×3 IMPLANT
GOWN STRL REUS W/TWL LRG LVL3 (GOWN DISPOSABLE) ×2
GOWN STRL REUS W/TWL XL LVL3 (GOWN DISPOSABLE) ×2
HEMOSTAT SURGICEL 2X14 (HEMOSTASIS) IMPLANT
KIT BASIN OR (CUSTOM PROCEDURE TRAY) ×3 IMPLANT
KIT POSITION SURG JACKSON T1 (MISCELLANEOUS) ×3 IMPLANT
KIT TURNOVER KIT B (KITS) ×3 IMPLANT
MANIFOLD NEPTUNE II (INSTRUMENTS) ×3 IMPLANT
NS IRRIG 1000ML POUR BTL (IV SOLUTION) ×3 IMPLANT
PACK LAMINECTOMY ORTHO (CUSTOM PROCEDURE TRAY) ×3 IMPLANT
PAD ARMBOARD 7.5X6 YLW CONV (MISCELLANEOUS) ×6 IMPLANT
PATTIES SURGICAL .75X.75 (GAUZE/BANDAGES/DRESSINGS) ×3 IMPLANT
ROD 40MM (Rod) ×2 IMPLANT
ROD 45MM (Rod) ×2 IMPLANT
ROD SPNL CVD 40X5.5XHRD NS (Rod) ×1 IMPLANT
ROD SPNL CVD 45X5.5XHRD NS (Rod) ×1 IMPLANT
SCREW MATRIX MIS 6.0X40MM (Screw) ×12 IMPLANT
SPACER TPAL 10X10X28MM (Spacer) ×3 IMPLANT
SPONGE LAP 18X18 RF (DISPOSABLE) IMPLANT
SPONGE LAP 4X18 RFD (DISPOSABLE) ×6 IMPLANT
SPONGE SURGIFOAM ABS GEL 100 (HEMOSTASIS) ×3 IMPLANT
STAPLER VISISTAT 35W (STAPLE) ×3 IMPLANT
SUT BONE WAX W31G (SUTURE) ×3 IMPLANT
SUT VIC AB 0 CT1 27 (SUTURE) ×2
SUT VIC AB 0 CT1 27XBRD ANBCTR (SUTURE) ×1 IMPLANT
SUT VIC AB 1 CTX 36 (SUTURE) ×4
SUT VIC AB 1 CTX36XBRD ANBCTR (SUTURE) ×2 IMPLANT
SUT VIC AB 2-0 CT1 27 (SUTURE) ×2
SUT VIC AB 2-0 CT1 TAPERPNT 27 (SUTURE) ×1 IMPLANT
SUT VIC AB 3-0 X1 27 (SUTURE) ×3 IMPLANT
TOWEL GREEN STERILE (TOWEL DISPOSABLE) ×3 IMPLANT
TOWEL GREEN STERILE FF (TOWEL DISPOSABLE) ×3 IMPLANT
TRAY FOLEY MTR SLVR 16FR STAT (SET/KITS/TRAYS/PACK) ×3 IMPLANT
WATER STERILE IRR 1000ML POUR (IV SOLUTION) ×3 IMPLANT
YANKAUER SUCT BULB TIP NO VENT (SUCTIONS) ×3 IMPLANT

## 2018-05-03 NOTE — Anesthesia Postprocedure Evaluation (Signed)
Anesthesia Post Note  Patient: Kellie Jones  Procedure(s) Performed: L4-5 transforaminal interbody fusion, pedicle instrumentation, cage (N/A Spine Lumbar)     Patient location during evaluation: PACU Anesthesia Type: General Level of consciousness: awake and alert and oriented Pain management: pain level controlled Vital Signs Assessment: post-procedure vital signs reviewed and stable Respiratory status: spontaneous breathing, nonlabored ventilation and respiratory function stable Cardiovascular status: blood pressure returned to baseline and stable Postop Assessment: no apparent nausea or vomiting Anesthetic complications: no    Last Vitals:  Vitals:   05/03/18 1201 05/03/18 1216  BP: 114/63 115/65  Pulse: (!) 108 (!) 107  Resp: (!) 21 19  Temp:    SpO2: 94% 93%    Last Pain:  Vitals:   05/03/18 1230  PainSc: 7     LLE Motor Response: Purposeful movement (05/03/18 1230) LLE Sensation: Full sensation;No numbness;No tingling (05/03/18 1230) RLE Motor Response: Purposeful movement (05/03/18 1230) RLE Sensation: Full sensation;No numbness;No tingling (05/03/18 1230)      [name] A.

## 2018-05-03 NOTE — Progress Notes (Signed)
Orthopedic Tech Progress Note Patient Details:  Kellie Jones [birth year] [id number]  Patient ID: Kellie Jones, female   DOB: 03/31/1960, 58 y.o.   MRN: [id number] Called in order to bio tech.  Karolee Stamps 05/03/2018, 2:24 PM

## 2018-05-03 NOTE — Interval H&P Note (Signed)
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 After consideration of risks, benefits and other options for treatment, the patient has consented to  Procedure(s): L4-5 transforaminal interbody fusion, pedicle instrumentation, cage (N/A) as a surgical intervention .  The patient's history has been reviewed, patient examined, no change in status, stable for surgery.  I have reviewed the patient's chart and labs.  Questions were answered to the patient's satisfaction.     Marybelle Killings

## 2018-05-03 NOTE — Plan of Care (Signed)
  Problem: Education: Goal: Knowledge of General Education information will improve Description Including pain rating scale, medication(s)/side effects and non-pharmacologic comfort measures Outcome: Progressing   Problem: Clinical Measurements: Goal: Will remain free from infection Outcome: Progressing   Problem: Clinical Measurements: Goal: Respiratory complications will improve Outcome: Progressing   Problem: Elimination: Goal: Will not experience complications related to bowel motility Outcome: Progressing   Problem: Pain Managment: Goal: General experience of comfort will improve Outcome: Progressing   Problem: Safety: Goal: Ability to remain free from injury will improve Outcome: Progressing   Problem: Skin Integrity: Goal: Risk for impaired skin integrity will decrease Outcome: Progressing

## 2018-05-03 NOTE — Transfer of Care (Signed)
Immediate Anesthesia Transfer of Care Note  Patient: Kellie Jones  Procedure(s) Performed: L4-5 transforaminal interbody fusion, pedicle instrumentation, cage (N/A Spine Lumbar)  Patient Location: PACU  Anesthesia Type:General  Level of Consciousness: awake, alert  and oriented  Airway & Oxygen Therapy: Patient Spontanous Breathing and Patient connected to nasal cannula oxygen  Post-op Assessment: Report given to RN, Post -op Vital signs reviewed and stable and Patient moving all extremities X 4  Post vital signs: Reviewed and stable  Last Vitals:  Vitals Value Taken Time  BP 106/70 05/03/2018 11:46 AM  Temp    Pulse 112 05/03/2018 11:48 AM  Resp 23 05/03/2018 11:48 AM  SpO2 90 % 05/03/2018 11:48 AM  Vitals shown include unvalidated device data.  Last Pain:  Vitals:   05/03/18 0636  PainSc: 8       Patients Stated Pain Goal: 2 (23/36/12 2449)  Complications: No apparent anesthesia complications

## 2018-05-03 NOTE — Telephone Encounter (Signed)
Dr. Lorin Mercy is aware and reviewed notes in Media tab.

## 2018-05-03 NOTE — Plan of Care (Signed)

## 2018-05-03 NOTE — Progress Notes (Signed)
Patient c/o itching MD on call notified new orders received will continue to monitor. Arthor Captain LPN

## 2018-05-03 NOTE — H&P (Signed)
Kellie Jones is an 58 y.o. female.   Chief Complaint: Lumbar stenosis and instability L84-90 HPI: 58 year old female with on-the-job injury with instability and severe stenosis L4-5 shifting on flexion-extension and compression.  She had failed conservative treatment and has been symptomatic for greater than a year.  She has extensive pulmonary problems and was seen by her pulmonologist yesterday.  His consult is reviewed.  Past Medical History:  Diagnosis Date  . Anxiety   . Arthritis   . Asthma due to seasonal allergies   . Constipation   . COPD (chronic obstructive pulmonary disease) ([place])   . Depression   . Dyslipidemia   . Dyspnea   . History of blood transfusion   . History of kidney stones    several passed  . Hypothyroidism   . Migraine    Migraines  . Neuropathic pain   . Pneumonia    frequent - last time 2015 ish  . PONV (postoperative nausea and vomiting)   . Spinal stenosis     Past Surgical History:  Procedure Laterality Date  . ABDOMINAL HYSTERECTOMY     total  . APPENDECTOMY    . CHOLECYSTECTOMY    . COLONOSCOPY      Family History  Problem Relation Age of Onset  . Depression Mother   . Cancer Father 22       pancreatic  . Healthy Sister   . Healthy Brother   . Cancer Maternal Aunt 47       breast  . Cancer Maternal Grandmother 75       breast  . Healthy Brother   . CAD Brother 52       CABG  . Healthy Brother    Social History:  reports that she has been smoking. She has a 8.50 pack-year smoking history. She has never used smokeless tobacco. She reports current alcohol use. She reports that she does not use drugs.  Allergies:  Allergies  Allergen Reactions  . Iodinated Diagnostic Agents Swelling and Other (See Comments)    Sneezing and eye swelling  . Other Anaphylaxis    Contrast media dye  . Codeine Rash    Medications Prior to Admission  Medication Sig Dispense Refill  . albuterol (PROVENTIL HFA;VENTOLIN HFA) 108 (90 Base) MCG/ACT  inhaler Inhale 1-2 puffs into the lungs every 6 (six) hours as needed. For wheezing 1 Inhaler 0  . Aspirin-Salicylamide-Caffeine (BC HEADACHE) 325-95-16 MG TABS Take 1 packet by mouth 3 (three) times daily as needed (headache).     Marland Kitchen atorvastatin (LIPITOR) 20 MG tablet Take 1 tablet (20 mg total) by mouth at bedtime. 90 tablet 3  . B Complex-C (B-COMPLEX WITH VITAMIN C) tablet Take 1 tablet by mouth daily.    . bisacodyl (DULCOLAX) 5 MG EC tablet Take 5 mg by mouth daily as needed for moderate constipation.    . Cholecalciferol (VITAMIN D3) 5000 units CAPS Take 5,000 Units by mouth daily.     . DULoxetine HCl 40 MG CPEP Take 40 mg by mouth daily. 90 capsule 1  . levothyroxine (SYNTHROID, LEVOTHROID) 150 MCG tablet Take 1 tablet (150 mcg total) by mouth daily. 90 tablet 1  . omeprazole (PRILOSEC) 20 MG capsule Take 20 mg by mouth as needed.    . sertraline (ZOLOFT) 100 MG tablet Take 1 tablet (100 mg total) by mouth at bedtime. 90 tablet 1  . TRELEGY ELLIPTA 100-62.5-25 MCG/INH AEPB Take 1 puff by mouth daily.   3  . Vitamin D, Ergocalciferol, (  DRISDOL) 1.25 MG (50000 UT) CAPS capsule Take one tablet wkly (Patient taking differently: Take 50,000 Units by mouth every 7 (seven) days. ) 12 capsule 1  . calcium carbonate (TUMS - DOSED IN MG ELEMENTAL CALCIUM) 500 MG chewable tablet Chew 1 tablet by mouth daily as needed for indigestion or heartburn.    . metaxalone (SKELAXIN) 800 MG tablet Take 800 mg by mouth 3 (three) times daily as needed for muscle spasms.     . SUMAtriptan (IMITREX) 100 MG tablet TAKE 1 TABLET BY MOUTH AS NEEDED. TAKE ALONG WITH 400MG  IBUPROFEN AT ONSET OF MIGRAINE. (Patient taking differently: Take 100 mg by mouth every 2 (two) hours as needed for migraine. ) 10 tablet 1    No results found for this or any previous visit (from the past 48 hour(s)). No results found.  Review of Systems  Constitutional: Negative.   HENT: Negative.   Eyes: Negative.   Respiratory: Positive for  sputum production. Negative for cough and hemoptysis.        Occasional sputum production currently clear.  Cardiovascular: Negative for chest pain and palpitations.  Gastrointestinal: Negative.   Genitourinary: Negative.   Musculoskeletal: Positive for back pain.  Skin: Negative.   Neurological: Negative for weakness.  Psychiatric/Behavioral: Negative.     Blood pressure 123/83, pulse 83, temperature 97.9 F (36.6 C), resp. rate 17, SpO2 95 %. Physical Exam  Constitutional: She is oriented to person, place, and time. She appears well-developed and well-nourished.  HENT:  Head: Normocephalic.  Eyes: Pupils are equal, round, and reactive to light.  Neck: Normal range of motion. Neck supple. No tracheal deviation present. No thyromegaly present.  Cardiovascular: Normal rate.  Respiratory: Effort normal.  Prolonged expiration phase.  No active wheezing.  No shortness of breath.  GI: Soft. Bowel sounds are normal. She exhibits no distension. There is no abdominal tenderness.  Neurological: She is alert and oriented to person, place, and time.  Skin: Skin is warm and dry.  Psychiatric: She has a normal mood and affect. Her behavior is normal. Judgment and thought content normal.  Patient slow getting from sitting to standing she changes positions.  Reflexes are 3+ and symmetrical no hip flexion weakness.  Limitation forward flexion only 50% due to pain some pain with extension.  Very slow getting on and off the exam table secondary to back pain.  Assessment/Plan Patient myelogram upright position demonstrates pronounced disc protrusion with upright position weightbearing.  She has shifting on flexion-extension of 3 to 7 mm at the L4-5 level.  Plan procedure discussed with 1 level fusion: Risk of dural tear reoperation screw malposition bone pulmonary problems need for intubation postoperatively and possible extensive time on a ventilator due to her poor lung conditions.  She is at moderate to  high risk due to her pulmonary status but has severe compression and inability to work and be active and request we proceed with surgery.  Questions were elicited and answered.  Plan L4-5 instrumented fusion.  Marybelle Killings, MD 05/03/2018, 7:26 AM

## 2018-05-03 NOTE — Op Note (Signed)
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  Postop diagnosis: Same  Procedure: Decompression with removal of unstable posterior elements (Gill procedure) microscope assisted, interbody fusion with right transforaminal approach with local bone and cage, single level pedicle instrumentation with bilateral intertransverse process fusion.  Surgeon: Rodell Perna, MD  Assistant: Benjiman Core, PA-C medically necessary and present for decompression instrumentation dural protection and closure.  EBL 3 to 400 cc  Drains: None  Anesthesia: General plus Marcaine local  Complications: None  Implants: Synthes  Depuy TPAL 10 mm x 10 mm x 28 mm cage packed with local bone.  Matrix screw system 6 x 40 mm screws x4.  40 mm rod on one side 45 mm rod the opposite side.  Procedure: After induction general anesthesia and discussion with patient's pulmonary history and possibility of BiPAP postop patient was intubated received Solu-Medrol 125 mg preoperatively as recommended by pulmonary consultation preoperatively.  Ancef was given prophylactically and after intubation patient was placed on spine frame careful padding positioning arms at 9090 yellow roll pads underneath her shoulders.  Calf compressive devices were used and Foley catheter was placed before patient was flipped over prone.  Back was prepped with DuraPrep timeout procedure was completed squared with towels laminectomy sheet drapes after sterile skin marker and Betadine Steri-Drape was applied and spinal needle placed in the back spot fluoroscopy pictures were taken marking appropriate level skin was marked and then the midline incision was made subperiosteal dissection repeat x-rays with spot fluoroscopy picture was taken with Coker clamps placed on the level of decompression at the L4-5 interspace doing a complete laminectomy at L4 and top portion of L5 was removed.  Thick chunks of ligament  root was removed.  The posterior element L4 was hypermobile consistent with the patient's 3 mm of shifting and disc protrusion that was present noted on myelogram CT scan preoperatively.  The unstable posterior elements were removed to the degenerative facets and on the right side facet was removed after Penfield 4 was placed stabbed into the disc confirmed with spot fluoroscopy image confirming appropriate level.  With the facet removed on the right side discectomy was performed using scrapers ring curettes angled rasps straight rasps straight pituitary up-biting pituitary cleaning up the disc.  The anterior annulus was intact.  Benjiman Core, PA-C was present symptoms initial procedure was made and is operative microscope was brought in for removal of the lamina with osteotome.  With dura protected lateral gutters were removed decompressing the lateral recess stenosis on each side removing chunks of ligament and some overhanging spurs.  With the shaver and dilator placed we progressed up to 10 mm cage that restored disc space height had a nice tight fit and a 10 mm cage was selected.  The decorticated lamina and facet on the right side had been cleaned of soft tissue chipped into small pieces great not sized pieces and was packed in the middle of the cage.  Using long Russians bone was packed anterior to the plan position of the cage using straight impactor hammer the curved impactor and using the 7 mm trial cage as an impactor as well making sure that the cage would be able to be placed below the level of the cortex.  Once the bone was packed a large amount into the disc space pressed forward 10 mm cage was placed with careful protection of the dura with the D'Errico nerve root above.  Bone to move moved out to the  level of pedicle and cage was inserted checked on the fluoroscopy then kicked over until it was at the midline and was at the mid weight position between posterior and anterior interbody.  Bone to bone  is visualized anterior to the cage.  Next pedicle screws were placed repeating the sequence with C-arm visualization using the spike starter.  Transverse process were visualized a bur was used slightly lateral in line with the midportion of the transverse process using the spike checking with C arm followed by using the straight joystick pushing it down the pedicle checking under fluoroscopy second image feeling with the pedicle feeler followed by measurement which turned out to be 40 mm screws were all 4 pedicle screws.  Tapping feeling with pedicle feeler decorticating the transverse process for later bone grafting and then placement of the screw.  Final spot pictures were taken and this was repeated 4 times.  The screw on the left L5 started slightly high in the pedicle but was inside and within bone.  C arm was angled looking directly down the pedicle confirmed it was inside bone and pedicle feeler palpated anteriorly to make sure was completely in bone.  All other screws look good and there was convergence.  40 mm rod placed on one side 45 on the other lockdown with one screw and then compression first on the right side followed by the left side since the cage was placed in on the right side.  Compression was achieved.  There was 1 to 2 mm of rod sticking out above the screws on each side all screws were locked down with 2 clicks.  Final spot fluoroscopy pictures were taken to document good position.  A bayonet was placed on the right side to document appropriate side and bone graft was packed meticulously out over the intertransverse process for lateral fusion due to the patient's instability.  Dura was intact lateral gutters were run there is no bone down on top of the cage.  Epidural bleeding was minimal and standard layered closure with #1 Vicryl 2-0 Vicryl and skin staple closure postop dressing and transferred recovery in stable condition.  Instrument count needle count was correct.  Patient was  neurologically intact in the recovery room with good dorsiflexion plantarflexion and no numbness.

## 2018-05-03 NOTE — Anesthesia Procedure Notes (Signed)
Procedure Name: Intubation Date/Time: 05/03/2018 7:36 AM Performed by: Kyung Rudd, CRNA Pre-anesthesia Checklist: Patient identified, Emergency Drugs available, Suction available and Patient being monitored Patient Re-evaluated:Patient Re-evaluated prior to induction Oxygen Delivery Method: Circle system utilized Preoxygenation: Pre-oxygenation with 100% oxygen Induction Type: IV induction Ventilation: Mask ventilation without difficulty Laryngoscope Size: Mac and 3 Grade View: Grade I Tube type: Oral Tube size: 7.0 mm Number of attempts: 1 Airway Equipment and Method: Stylet Placement Confirmation: ETT inserted through vocal cords under direct vision,  positive ETCO2 and breath sounds checked- equal and bilateral Secured at: 21 cm Dental Injury: Teeth and Oropharynx as per pre-operative assessment  Comments: AOI by Joneen Roach, SRNA with Dr. Royce Macadamia supervising.

## 2018-05-04 LAB — BASIC METABOLIC PANEL
Anion gap: 10 (ref 5–15)
BUN: 9 mg/dL (ref 6–20)
CO2: 22 mmol/L (ref 22–32)
Calcium: 8.4 mg/dL — ABNORMAL LOW (ref 8.9–10.3)
Chloride: 104 mmol/L (ref 98–111)
Creatinine, Ser: 0.98 mg/dL (ref 0.44–1.00)
GFR calc Af Amer: >60 mL/min (ref >60)
GFR calc non Af Amer: >60 mL/min (ref >60)
Glucose, Bld: 134 mg/dL — ABNORMAL HIGH (ref 70–99)
Potassium: 3.8 mmol/L (ref 3.5–5.1)
Sodium: 136 mmol/L (ref 135–145)

## 2018-05-04 LAB — CBC
HCT: 31.7 % — ABNORMAL LOW (ref 36.0–46.0)
Hemoglobin: 10.3 g/dL — ABNORMAL LOW (ref 12.0–15.0)
MCH: 30.3 pg (ref 26.0–34.0)
MCHC: 32.5 g/dL (ref 30.0–36.0)
MCV: 93.2 fL (ref 80.0–100.0)
Platelets: 235 10*3/uL (ref 150–400)
RBC: 3.4 MIL/uL — ABNORMAL LOW (ref 3.87–5.11)
RDW: 12.7 % (ref 11.5–15.5)
WBC: 12.1 10*3/uL — ABNORMAL HIGH (ref 4.0–10.5)
nRBC: 0 % (ref 0.0–0.2)

## 2018-05-04 MED ORDER — OXYCODONE HCL 5 MG PO TABS: 5.0000 mg | ORAL_TABLET | ORAL | 0 refills | Status: DC | PRN

## 2018-05-04 NOTE — Care Management (Signed)
Rolling walker ordered from [facility] to be delivered to the patient in the room prior to d/c.

## 2018-05-04 NOTE — Evaluation (Signed)
Occupational Therapy Evaluation and Discharge Patient Details Name: Kellie Jones MRN: [id number] DOB: 11-14-1960 Today's Date: 05/04/2018    History of Present Illness Pt is 58 yo female with PMH: COPD, anxiety, kidney stones who presents with L4-5 stenosis with compression who has failed conservative treatment and is now having PLIF L4-5.    Clinical Impression   Pt was assisted with LB dressing prior to admission and IADL intermittently when back was particularly painful. Presents with post op pain and nausea. Pt requires min assist for LB ADL, educated in use of AE, multiple uses of 3 in 1, compensatory strategies for ADL, transporting items safely with RW and tub transfer. Pt and family verbalizing understanding. No further OT needs. Pt eager to go home.    Follow Up Recommendations  No OT follow up    Equipment Recommendations  3 in 1 bedside commode    Recommendations for Other Services       Precautions / Restrictions Precautions Precautions: Back Precaution Booklet Issued: Yes (comment) Precaution Comments: reviewed precautions and functional implications Required Braces or Orthoses: Spinal Brace Spinal Brace: Applied in sitting position;Lumbar corset Restrictions Weight Bearing Restrictions: No      Mobility Bed Mobility               General bed mobility comments: pt received in chair  Transfers Overall transfer level: Needs assistance Equipment used: Rolling walker (2 wheeled) Transfers: Sit to/from Stand Sit to Stand: Supervision         General transfer comment: supervision for safety    Balance Overall balance assessment: Mild deficits observed, not formally tested                                         ADL either performed or assessed with clinical judgement   ADL Overall ADL's : Needs assistance/impaired Eating/Feeding: Independent;Sitting   Grooming: Oral care;Standing;Wash/dry face Grooming Details (indicate cue  type and reason): educated in 2 cup method for brushing teeth and use of wash cloth to wash face at sink Upper Body Bathing: Minimal assistance;Sitting Upper Body Bathing Details (indicate cue type and reason): recommended use of long handled bath sponge Lower Body Bathing: Minimal assistance;Sit to/from stand Lower Body Bathing Details (indicate cue type and reason): recommended long handled bath sponge, husband will dry feet for pt, educated in use of 3 in 1 as shower seat Upper Body Dressing : Set up;Sitting   Lower Body Dressing: Minimal assistance;Sit to/from stand Lower Body Dressing Details (indicate cue type and reason): husband will assist, educated in safe footwear Toilet Transfer: Min guard;Ambulation;RW Toilet Transfer Details (indicate cue type and reason): educated in use of 3 in 1 over toilet Toileting- Clothing Manipulation and Hygiene: Supervision/safety;Sit to/from stand Toileting - Clothing Manipulation Details (indicate cue type and reason): instructed to avoid twisting with pericare Tub/ Shower Transfer: Min guard;Ambulation;3 in 1;Rolling walker Tub/Shower Transfer Details (indicate cue type and reason): educated in technique with RW Functional mobility during ADLs: Min guard;Rolling walker General ADL Comments: instructed in IADL activities and those to avoid as well as how to transport items safely with RW     Vision Patient Visual Report: No change from baseline       Perception     Praxis      Pertinent Vitals/Pain Pain Assessment: Faces Faces Pain Scale: Hurts even more Pain Location: back Pain Descriptors / Indicators: Aching;Sore;Operative site guarding;Grimacing  Pain Intervention(s): Patient requesting pain meds-RN notified;Repositioned;Monitored during session;Limited activity within patient's tolerance     Hand Dominance Right   Extremity/Trunk Assessment Upper Extremity Assessment Upper Extremity Assessment: Overall WFL for tasks assessed    Lower Extremity Assessment Lower Extremity Assessment: Defer to PT evaluation RLE Deficits / Details: pt had RLE pain preop and has had some since surgery, antalgic gait pattern noted as a result. Pt toes in BLE in standing, pt reports this has been since childhood RLE Sensation: (altered sensation from compression) RLE Coordination: WNL   Cervical / Trunk Assessment Cervical / Trunk Assessment: Other exceptions(post op back sx)   Communication Communication Communication: No difficulties   Cognition Arousal/Alertness: Awake/alert Behavior During Therapy: WFL for tasks assessed/performed Overall Cognitive Status: Within Functional Limits for tasks assessed                                     General Comments  discussed car transfer and activity level for home    Exercises     Shoulder Instructions      Home Living Family/patient expects to be discharged to:: Private residence Living Arrangements: Children;Spouse/significant other Available Help at Discharge: Family;Available 24 hours/day Type of Home: House Home Access: Stairs to enter [employer] of Steps: 6 Entrance Stairs-Rails: Right Home Layout: One level     Bathroom Shower/Tub: Teacher, early years/pre: Standard     Home Equipment: [employer] - single point   Additional Comments: pt's husband and daughter both have some time off work to be home with her      Prior Functioning/Environment Level of Independence: Needs assistance    ADL's / Homemaking Assistance Needed: needed assist for LB dressing             OT Problem List: Impaired balance (sitting and/or standing);Pain;Decreased knowledge of use of DME or AE;Decreased knowledge of precautions      OT Treatment/Interventions:      OT Goals(Current goals can be found in the care plan section) Acute Rehab OT Goals Patient Stated Goal: return home  OT Frequency:     Barriers to D/C:            Co-evaluation               AM-PAC OT "6 Clicks" Daily Activity     Outcome Measure Help from another person eating meals?: None Help from another person taking care of personal grooming?: A Little Help from another person toileting, which includes using toliet, bedpan, or urinal?: A Little Help from another person bathing (including washing, rinsing, drying)?: A Little Help from another person to put on and taking off regular upper body clothing?: None Help from another person to put on and taking off regular lower body clothing?: A Little 6 Click Score: 20   End of Session Equipment Utilized During Treatment: Gait belt;Rolling walker;Back brace Nurse Communication: Other (comment)(nausea meds)  Activity Tolerance: Treatment limited secondary to medical complications (Comment)(nausea) Patient left: in chair;with call bell/phone within reach;with nursing/sitter in room;with family/visitor present  OT Visit Diagnosis: Pain                Time: 6010-9323 OT Time Calculation (min): 20 min Charges:  OT General Charges $OT Visit: 1 Visit OT Evaluation $OT Eval Moderate Complexity: 1 Mod  Nestor Lewandowsky, OTR/L Acute Rehabilitation Services Pager: [phone] Office: [phone]  Malka So 05/04/2018, 10:07 AM

## 2018-05-04 NOTE — Discharge Instructions (Signed)
Go slow with your activities. Wear your back brace when up. No lifting. You can get your dressing wet in the shower.

## 2018-05-04 NOTE — Care Management (Signed)
3n1 ordered per OT and family request.  [facility] will deliver to room.  RW received.

## 2018-05-04 NOTE — Social Work (Signed)
Bedside RN notified CSW that pt requires a walker today for d/c. Chart reviewed currently no order. Spoke with RN Case Manager who will obtain order and provide walker for pt.  CSW signing off. Please consult if any additional needs arise.  Alexander [place], [place] [facility] [phone]

## 2018-05-04 NOTE — Evaluation (Signed)
Physical Therapy Evaluation Patient Details Name: Kellie Jones MRN: [id number] DOB: 09-18-1960 Today's Date: 05/04/2018   History of Present Illness  Pt is 58 yo female with PMH: COPD, anxiety, kidney stones who presents with L4-5 stenosis with compression who has failed conservative treatment and is now having PLIF L4-5.   Clinical Impression  Patient evaluated by Physical Therapy with no further acute PT needs identified. All education has been completed and the patient has no further questions. Pt supervision level with mobility, reviewed precautions and gave pt handout. Ambulated 92' with RW and supervision, pt became very shaky and painful and needed to return to room. Verbally reviewed stairs for getting in home.  See below for any follow-up Physical Therapy or equipment needs. PT is signing off. Thank you for this referral.     Follow Up Recommendations Home health PT    Equipment Recommendations  Rolling walker with 5" wheels    Recommendations for Other Services       Precautions / Restrictions Precautions Precautions: Back Precaution Booklet Issued: Yes (comment) Precaution Comments: reviewed precautions and functional implications Required Braces or Orthoses: Spinal Brace Spinal Brace: Applied in sitting position;Lumbar corset Restrictions Weight Bearing Restrictions: No      Mobility  Bed Mobility               General bed mobility comments: pt received up in bathroom, talked through bed mobility with keeping precautions and showed pics on HO  Transfers Overall transfer level: Modified independent Equipment used: Rolling walker (2 wheeled)             General transfer comment: supervision for safety  Ambulation/Gait Ambulation/Gait assistance: Supervision Gait Distance (Feet): 70 Feet Assistive device: Rolling walker (2 wheeled) Gait Pattern/deviations: Step-through pattern;Decreased stride length Gait velocity: decreased Gait velocity  interpretation: <1.8 ft/sec, indicate of risk for recurrent falls General Gait Details: increased tension shoulders and neck, became shaky after 26' and needed to return to sitting where she became very nauseous.   Stairs Stairs: (discussed verbally)          Wheelchair Mobility    Modified Rankin (Stroke Patients Only)       Balance Overall balance assessment: Mild deficits observed, not formally tested                                           Pertinent Vitals/Pain Pain Assessment: Faces Faces Pain Scale: Hurts even more Pain Location: back Pain Descriptors / Indicators: Aching;Sore;Operative site guarding;Grimacing Pain Intervention(s): Limited activity within patient's tolerance;Monitored during session;Premedicated before session    Home Living Family/patient expects to be discharged to:: Private residence Living Arrangements: Children;Spouse/significant other Available Help at Discharge: Family;Available 24 hours/day Type of Home: House Home Access: Stairs to enter Entrance Stairs-Rails: Right Entrance Stairs-Number of Steps: 6 Home Layout: One level Home Equipment: Cane - single point Additional Comments: pt's husband and daughter both have some time off work to be home with her    Prior Function Level of Independence: Independent               Hand Dominance        Extremity/Trunk Assessment   Upper Extremity Assessment Upper Extremity Assessment: Overall WFL for tasks assessed    Lower Extremity Assessment Lower Extremity Assessment: RLE deficits/detail RLE Deficits / Details: pt had RLE pain preop and has had some since surgery, antalgic gait pattern  noted as a result. Pt toes in BLE in standing, pt reports this has been since childhood RLE Sensation: (altered sensation from compression) RLE Coordination: WNL    Cervical / Trunk Assessment Cervical / Trunk Assessment: Normal  Communication   Communication: No  difficulties  Cognition Arousal/Alertness: Awake/alert Behavior During Therapy: WFL for tasks assessed/performed Overall Cognitive Status: Within Functional Limits for tasks assessed                                        General Comments General comments (skin integrity, edema, etc.): discussed car transfer and activity level for home    Exercises     Assessment/Plan    PT Assessment All further PT needs can be met in the next venue of care  PT Problem List Decreased activity tolerance;Decreased mobility;Decreased knowledge of use of DME;Decreased knowledge of precautions;Pain       PT Treatment Interventions      PT Goals (Current goals can be found in the Care Plan section)  Acute Rehab PT Goals Patient Stated Goal: return home PT Goal Formulation: With patient/family    Frequency     Barriers to discharge        Co-evaluation               AM-PAC PT "6 Clicks" Mobility  Outcome Measure Help needed turning from your back to your side while in a flat bed without using bedrails?: A Little Help needed moving from lying on your back to sitting on the side of a flat bed without using bedrails?: A Little Help needed moving to and from a bed to a chair (including a wheelchair)?: A Little Help needed standing up from a chair using your arms (e.g., wheelchair or bedside chair)?: A Little Help needed to walk in hospital room?: A Little Help needed climbing 3-5 steps with a railing? : A Little 6 Click Score: 18    End of Session Equipment Utilized During Treatment: Back brace Activity Tolerance: Patient tolerated treatment well Patient left: in chair;with call bell/phone within reach;with family/visitor present Nurse Communication: Mobility status PT Visit Diagnosis: Pain;Difficulty in walking, not elsewhere classified (R26.2) Pain - Right/Left: Right Pain - part of body: Leg    Time: 6962-9528 PT Time Calculation (min) (ACUTE ONLY): 31  min   Charges:   PT Evaluation $PT Eval Low Complexity: 1 Low PT Treatments $Gait Training: 8-22 mins        Leighton Roach, [place]  Pager [phone] Office [place] 05/04/2018, 9:49 AM

## 2018-05-04 NOTE — Progress Notes (Signed)
Case Manager notified of the need for a walker- the patient has been discharged.  Discharge instructions provided - discharge packet in place.  The patient's husband and daughter verbalize understanding instructions.

## 2018-05-04 NOTE — Progress Notes (Signed)
Subjective: 1 Day Post-Op Procedure(s) (LRB): L4-5 transforaminal interbody fusion, pedicle instrumentation, cage (N/A) Patient reports pain as moderate.  Denies leg weakness.  Has already been up ambulating.  Has back brace in the room.  Already wants to go home.  Objective: Vital signs in last 24 hours: Temp:  [97.2 F (36.2 C)-98.5 F (36.9 C)] 98.5 F (36.9 C) (03/07 0411) Pulse Rate:  [86-117] 88 (03/07 0411) Resp:  [16-21] 19 (03/07 0411) BP: (91-115)/(59-70) 96/59 (03/07 0411) SpO2:  [86 %-96 %] 96 % (03/07 0411)  Intake/Output from previous day: 03/06 0701 - 03/07 0700 In: 1250 [P.O.:200; I.V.:1050] Out: 1425 [Urine:1025; Blood:400] Intake/Output this shift: No intake/output data recorded.  Recent Labs    05/04/18 0346  HGB 10.3*   Recent Labs    05/04/18 0346  WBC 12.1*  RBC 3.40*  HCT 31.7*  PLT 235   Recent Labs    05/04/18 0346  NA 136  K 3.8  CL 104  CO2 22  BUN 9  CREATININE 0.98  GLUCOSE 134*  CALCIUM 8.4*   No results for input(s): LABPT, INR in the last 72 hours.  Intact pulses distally Dorsiflexion/Plantar flexion intact Incision: scant drainage   Assessment/Plan: 1 Day Post-Op Procedure(s) (LRB): L4-5 transforaminal interbody fusion, pedicle instrumentation, cage (N/A) Will discharge home today since doing so well.      Mcarthur Rossetti 05/04/2018, 7:53 AM

## 2018-05-06 ENCOUNTER — Encounter (INDEPENDENT_AMBULATORY_CARE_PROVIDER_SITE_OTHER): Payer: Self-pay | Admitting: Orthopaedic Surgery

## 2018-05-07 NOTE — Discharge Summary (Signed)
Patient ID: Carleen Rhue MRN: [id number] DOB/AGE: 58-Oct-1962 58 y.o.  Admit date: 05/03/2018 Discharge date: 05/07/2018  Admission Diagnoses:  Active Problems:   Lumbar stenosis   Protrusion of lumbar intervertebral disc   Discharge Diagnoses:  Active Problems:   Lumbar stenosis   Protrusion of lumbar intervertebral disc  status post Procedure(s): L4-5 transforaminal interbody fusion, pedicle instrumentation, cage  Past Medical History:  Diagnosis Date  . Anxiety   . Arthritis   . Asthma due to seasonal allergies   . Constipation   . COPD (chronic obstructive pulmonary disease) ([place])   . Depression   . Dyslipidemia   . Dyspnea   . History of blood transfusion   . History of kidney stones    several passed  . Hypothyroidism   . Migraine    Migraines  . Neuropathic pain   . Pneumonia    frequent - last time 2015 ish  . PONV (postoperative nausea and vomiting)   . Spinal stenosis     Surgeries: Procedure(s): L4-5 transforaminal interbody fusion, pedicle instrumentation, cage on 05/03/2018   Consultants:   Discharged Condition: Improved  Hospital Course: Sharmila Wrobleski is an 58 y.o. female who was admitted 05/03/2018 for operative treatment of lumbar stenosis. Patient failed conservative treatments (please see the history and physical for the specifics) and had severe unremitting pain that affects sleep, daily activities and work/hobbies. After pre-op clearance, the patient was taken to the operating room on 05/03/2018 and underwent  Procedure(s): L4-5 transforaminal interbody fusion, pedicle instrumentation, cage.    Patient was given perioperative antibiotics:  Anti-infectives (From admission, onward)   Start     Dose/Rate Route Frequency Ordered Stop   05/03/18 1900  ceFAZolin (ANCEF) IVPB 1 g/50 mL premix     1 g 100 mL/hr over 30 Minutes Intravenous Every 8 hours 05/03/18 1410 05/04/18 0409   05/03/18 0630  ceFAZolin (ANCEF) IVPB 2g/100 mL premix     2 g 200  mL/hr over 30 Minutes Intravenous On call to O.R. 05/03/18 0962 05/03/18 1139       Patient was given sequential compression devices and early ambulation to prevent DVT.   Patient benefited maximally from hospital stay and there were no complications. At the time of discharge, the patient was urinating/moving their bowels without difficulty, tolerating a regular diet, pain is controlled with oral pain medications and they have been cleared by PT/OT.   Recent vital signs: No data found.   Recent laboratory studies: No results for input(s): WBC, HGB, HCT, PLT, NA, K, CL, CO2, BUN, CREATININE, GLUCOSE, INR, CALCIUM in the last 72 hours.  Invalid input(s): PT, 2   Discharge Medications:   Allergies as of 05/04/2018      Reactions   Iodinated Diagnostic Agents Swelling, Other (See Comments)   Sneezing and eye swelling   Other Anaphylaxis   Contrast media dye   Codeine Rash      Medication List    TAKE these medications   albuterol 108 (90 Base) MCG/ACT inhaler Commonly known as:  PROVENTIL HFA;VENTOLIN HFA Inhale 1-2 puffs into the lungs every 6 (six) hours as needed. For wheezing   atorvastatin 20 MG tablet Commonly known as:  LIPITOR Take 1 tablet (20 mg total) by mouth at bedtime.   B-complex with vitamin C tablet Take 1 tablet by mouth daily.   BC Headache 325-95-16 MG Tabs Generic drug:  Aspirin-Salicylamide-Caffeine Take 1 packet by mouth 3 (three) times daily as needed (headache).   bisacodyl 5 MG  EC tablet Commonly known as:  DULCOLAX Take 5 mg by mouth daily as needed for moderate constipation.   calcium carbonate 500 MG chewable tablet Commonly known as:  TUMS - dosed in mg elemental calcium Chew 1 tablet by mouth daily as needed for indigestion or heartburn.   DULoxetine HCl 40 MG Cpep Take 40 mg by mouth daily.   levothyroxine 150 MCG tablet Commonly known as:  SYNTHROID, LEVOTHROID Take 1 tablet (150 mcg total) by mouth daily.   metaxalone 800 MG  tablet Commonly known as:  SKELAXIN Take 800 mg by mouth 3 (three) times daily as needed for muscle spasms.   omeprazole 20 MG capsule Commonly known as:  PRILOSEC Take 20 mg by mouth as needed.   oxyCODONE 5 MG immediate release tablet Commonly known as:  Roxicodone Take 1 tablet (5 mg total) by mouth every 4 (four) hours as needed.   sertraline 100 MG tablet Commonly known as:  ZOLOFT Take 1 tablet (100 mg total) by mouth at bedtime.   SUMAtriptan 100 MG tablet Commonly known as:  IMITREX TAKE 1 TABLET BY MOUTH AS NEEDED. TAKE ALONG WITH 400MG  IBUPROFEN AT ONSET OF MIGRAINE. What changed:  See the new instructions.   Trelegy Ellipta 100-62.5-25 MCG/INH Aepb Generic drug:  Fluticasone-Umeclidin-Vilant Take 1 puff by mouth daily.   Vitamin D (Ergocalciferol) 1.25 MG (50000 UT) Caps capsule Commonly known as:  DRISDOL Take one tablet wkly What changed:    how much to take  how to take this  when to take this  additional instructions   Vitamin D3 125 MCG (5000 UT) Caps Take 5,000 Units by mouth daily.       Diagnostic Studies: Dg Lumbar Spine 2-3 Views  Result Date: 05/03/2018 CLINICAL DATA:  L4-5 transforaminal interbody fusion, pedicle instrumentation, cage. 48 sec of FL time EXAM: LUMBAR SPINE - 2-3 VIEW; DG C-ARM 61-120 MIN COMPARISON:  None. FINDINGS: Seven spot fluoro graphic images show the placement of bilateral pedicle screws interconnecting rods at L4 and L5, as well as a well-centered radiolucent disc spacer. The orthopedic hardware is well seated and well-positioned. IMPRESSION: Operative imaging provided for L4-L5 posterior lumbar spine fusion. Orthopedic hardware appears well-positioned. Electronically Signed   By: Lajean Manes M.D.   On: 05/03/2018 11:42   Dg C-arm 1-60 Min  Result Date: 05/03/2018 CLINICAL DATA:  L4-5 transforaminal interbody fusion, pedicle instrumentation, cage. 48 sec of FL time EXAM: LUMBAR SPINE - 2-3 VIEW; DG C-ARM 61-120 MIN  COMPARISON:  None. FINDINGS: Seven spot fluoro graphic images show the placement of bilateral pedicle screws interconnecting rods at L4 and L5, as well as a well-centered radiolucent disc spacer. The orthopedic hardware is well seated and well-positioned. IMPRESSION: Operative imaging provided for L4-L5 posterior lumbar spine fusion. Orthopedic hardware appears well-positioned. Electronically Signed   By: Lajean Manes M.D.   On: 05/03/2018 11:42      Follow-up Information    Marybelle Killings, MD. Schedule an appointment as soon as possible for a visit in 2 week(s).   Specialty:  Orthopedic Surgery Contact information: [place] [address] [phone]           Discharge Plan:  discharge to home  Disposition:     Signed: Benjiman Core for Rodell Perna MD 05/07/2018, 9:21 AM

## 2018-05-09 ENCOUNTER — Encounter (INDEPENDENT_AMBULATORY_CARE_PROVIDER_SITE_OTHER): Payer: Self-pay | Admitting: Orthopaedic Surgery

## 2018-05-09 ENCOUNTER — Other Ambulatory Visit (INDEPENDENT_AMBULATORY_CARE_PROVIDER_SITE_OTHER): Payer: Self-pay | Admitting: Orthopaedic Surgery

## 2018-05-09 MED ORDER — OXYCODONE-ACETAMINOPHEN 5-325 MG PO TABS: 1.0000 | ORAL_TABLET | Freq: Four times a day (QID) | ORAL | 0 refills | Status: DC | PRN

## 2018-05-09 NOTE — Telephone Encounter (Signed)
Dr. Lorin Mercy spoke with patient.

## 2018-05-10 MED FILL — Thrombin (Recombinant) For Soln 20000 Unit: CUTANEOUS | Qty: 1 | Status: AC

## 2018-05-13 ENCOUNTER — Ambulatory Visit (INDEPENDENT_AMBULATORY_CARE_PROVIDER_SITE_OTHER): Payer: Worker's Compensation | Admitting: Orthopaedic Surgery

## 2018-05-13 ENCOUNTER — Ambulatory Visit (INDEPENDENT_AMBULATORY_CARE_PROVIDER_SITE_OTHER): Payer: Worker's Compensation

## 2018-05-13 ENCOUNTER — Encounter (INDEPENDENT_AMBULATORY_CARE_PROVIDER_SITE_OTHER): Payer: Self-pay | Admitting: Orthopaedic Surgery

## 2018-05-13 ENCOUNTER — Other Ambulatory Visit: Payer: Self-pay

## 2018-05-13 VITALS — BP 123/81 | HR 92 | Ht 66.0 in | Wt 165.0 lb

## 2018-05-13 DIAGNOSIS — Z981 Arthrodesis status: Secondary | ICD-10-CM

## 2018-05-13 NOTE — Progress Notes (Signed)
Post-Op Visit Note   Patient: Kellie Jones           Date of Birth: [birth year]           MRN: [id number] Visit Date: 05/13/2018 PCP: Mellody Dance, DO   Assessment & Plan: Post L4-5 Gill procedure fusion right T lift cage bilateral fusion.  X-rays look good staples are harvested. Recheck 4 wks. Work note given no work times 3 months.   Chief Complaint:  Chief Complaint  Patient presents with  . Lower Back - Routine Post Op    05/03/2018 L4-5 TLIF, Pedicle Instrumentation, Cage   Visit Diagnoses:  1. Status post lumbar spinal fusion     Plan: She got a refill of her pain medication on Friday.  She is trying to gradually back down her pain medication mostly having incisional pain.  Still has some pain in her back radiating down her right buttocks into her right thigh. Pain is better than pre-op. Continue daily walking. xrays look good. Recheck 4 wks.   Follow-Up Instructions: Return in about 4 weeks (around 06/10/2018).   Orders:  Orders Placed This Encounter  Procedures  . XR Lumbar Spine 2-3 Views   No orders of the defined types were placed in this encounter.   Imaging: Xr Lumbar Spine 2-3 Views  Result Date: 05/13/2018 AP lateral lumbar spine x-rays are obtained and reviewed.  This shows single level instrumented fusion L4-5.  Screw convergence cages in good position rod length looks good.  Spondylolisthesis is partially reduced.  Bone graft present anterior to the cage. Impression: Satisfactory postop single level instrumented fusion L4-5.   PMFS History: Patient Active Problem List   Diagnosis Date Noted  . Lumbar stenosis 05/03/2018  . Protrusion of lumbar intervertebral disc 05/03/2018  . Preop cardiovascular exam 04/22/2018  . Dyslipidemia 04/22/2018  . 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 Shortness of breath on exertion 04/08/2018  . Radiculopathy, lumbar region 03/06/2018  . Chronic back pain greater than 3 months duration 09/24/2017  . Excessive  drinking alcohol 09/24/2017  . Mammogram declined despite multiple attempts to get patient to go especially since her daughter with premature breast cancer 09/24/2017  . 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 Family history of breast cancer in first degree relative---> daughter- at age 83, 70nd daughter 58 08/17/2016  . Hyperalgesia- skin of feet.  07/17/2016  . Fibrocystic breast changes 07/17/2016  . GERD (gastroesophageal reflux disease) 01/19/2016  . Vitamin D deficiency 01/03/2016  . Tobacco use disorder: >30pk yr hx 01/03/2016  . Tobacco abuse counseling 01/03/2016  . Erythrocytosis due to hypoxemia 01/03/2016  . History of gastroesophageal reflux (GERD) 01/03/2016  . Environmental and seasonal allergies 01/03/2016  . Bilateral feet cramps- worse at night 12/11/2015  . Migraine 12/07/2015  . Hypothyroidism 12/07/2015  . COPD still smoking  12/07/2015  . Anxiety 12/07/2015  . Depression 12/07/2015  . Other fatigue 12/07/2015  . Absolute anemia 12/07/2015   Past Medical History:  Diagnosis Date  . Anxiety   . Arthritis   . Asthma due to seasonal allergies   . Constipation   . COPD (chronic obstructive pulmonary disease) ([place])   . Depression   . Dyslipidemia   . Dyspnea   . History of blood transfusion   . History of kidney stones    several passed  . Hypothyroidism   . Migraine    Migraines  . Neuropathic pain   . Pneumonia  frequent - last time 2015 ish  . PONV (postoperative nausea and vomiting)   . Spinal stenosis     Family History  Problem Relation Age of Onset  . Depression Mother   . Cancer Father 2       pancreatic  . Healthy Sister   . Healthy Brother   . Cancer Maternal Aunt 19       breast  . Cancer Maternal Grandmother 15       breast  . Healthy Brother   . CAD Brother 10       CABG  . Healthy Brother     Past Surgical History:  Procedure Laterality Date  . ABDOMINAL HYSTERECTOMY     total  .  APPENDECTOMY    . CHOLECYSTECTOMY    . COLONOSCOPY     Social History   Occupational History  . Not on file  Tobacco Use  . Smoking status: Current Some Day Smoker    Packs/day: 0.25    Years: 34.00    Pack years: 8.50  . Smokeless tobacco: Never Used  Substance and Sexual Activity  . Alcohol use: Yes    Comment: rarely  . Drug use: No  . Sexual activity: Never

## 2018-05-14 ENCOUNTER — Inpatient Hospital Stay (INDEPENDENT_AMBULATORY_CARE_PROVIDER_SITE_OTHER): Payer: Self-pay | Admitting: Orthopaedic Surgery

## 2018-05-16 ENCOUNTER — Encounter (INDEPENDENT_AMBULATORY_CARE_PROVIDER_SITE_OTHER): Payer: Self-pay | Admitting: Orthopaedic Surgery

## 2018-05-17 ENCOUNTER — Encounter (INDEPENDENT_AMBULATORY_CARE_PROVIDER_SITE_OTHER): Payer: Self-pay | Admitting: Orthopaedic Surgery

## 2018-05-17 MED ORDER — TRAMADOL HCL 50 MG PO TABS: 50.0000 mg | ORAL_TABLET | Freq: Four times a day (QID) | ORAL | 0 refills | Status: DC | PRN

## 2018-05-21 ENCOUNTER — Inpatient Hospital Stay (INDEPENDENT_AMBULATORY_CARE_PROVIDER_SITE_OTHER): Payer: Self-pay | Admitting: Orthopaedic Surgery

## 2018-05-22 ENCOUNTER — Encounter (INDEPENDENT_AMBULATORY_CARE_PROVIDER_SITE_OTHER): Payer: Self-pay | Admitting: Orthopaedic Surgery

## 2018-05-24 ENCOUNTER — Other Ambulatory Visit: Payer: Self-pay

## 2018-06-03 ENCOUNTER — Encounter (INDEPENDENT_AMBULATORY_CARE_PROVIDER_SITE_OTHER): Payer: Self-pay | Admitting: Orthopaedic Surgery

## 2018-06-03 MED ORDER — TRAMADOL HCL 50 MG PO TABS: 50.0000 mg | ORAL_TABLET | Freq: Four times a day (QID) | ORAL | 0 refills | Status: DC | PRN

## 2018-06-03 NOTE — Telephone Encounter (Signed)
Called to pharmacy 

## 2018-06-10 ENCOUNTER — Telehealth (INDEPENDENT_AMBULATORY_CARE_PROVIDER_SITE_OTHER): Payer: Self-pay | Admitting: Radiology

## 2018-06-10 NOTE — Telephone Encounter (Signed)
Called and spoke to patient, patient answered NO to all pre screening questions.

## 2018-06-11 ENCOUNTER — Other Ambulatory Visit: Payer: Self-pay

## 2018-06-11 ENCOUNTER — Encounter (INDEPENDENT_AMBULATORY_CARE_PROVIDER_SITE_OTHER): Payer: Self-pay | Admitting: Orthopaedic Surgery

## 2018-06-11 ENCOUNTER — Ambulatory Visit (INDEPENDENT_AMBULATORY_CARE_PROVIDER_SITE_OTHER): Payer: Worker's Compensation

## 2018-06-11 ENCOUNTER — Ambulatory Visit (INDEPENDENT_AMBULATORY_CARE_PROVIDER_SITE_OTHER): Payer: Worker's Compensation | Admitting: Orthopaedic Surgery

## 2018-06-11 VITALS — Ht 66.0 in | Wt 165.0 lb

## 2018-06-11 DIAGNOSIS — Z981 Arthrodesis status: Secondary | ICD-10-CM

## 2018-06-11 NOTE — Progress Notes (Signed)
Post-Op Visit Note   Patient: Kellie Jones           Date of Birth: 1960/10/25           MRN: [id number] Visit Date: 06/11/2018 PCP: Mellody Dance, DO   Assessment & Plan: Follow-up single level fusion.  Work slip given no work x6 weeks.  Recheck 5 wks.  If she is still having some symptoms will repeat 2 view x-rays if she is doing well we can skip the x-rays.  Chief Complaint:  Chief Complaint  Patient presents with  . Lower Back - Follow-up    05/03/2018 L4-5 TLIF, Pedicle Instrumentation, Cage   Visit Diagnoses:  1. Status post lumbar spinal fusion     Plan: no work times 6 wks. ROV 5 weeks  Follow-Up Instructions: Return in about 1 month (around 07/11/2018).   Orders:  Orders Placed This Encounter  Procedures  . XR Lumbar Spine 2-3 Views   No orders of the defined types were placed in this encounter.   Imaging: No results found.  PMFS History: Patient Active Problem List   Diagnosis Date Noted  . Lumbar stenosis 05/03/2018  . Protrusion of lumbar intervertebral disc 05/03/2018  . Preop cardiovascular exam 04/22/2018  . Dyslipidemia 04/22/2018  . 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 Shortness of breath on exertion 04/08/2018  . Radiculopathy, lumbar region 03/06/2018  . Chronic back pain greater than 3 months duration 09/24/2017  . Excessive drinking alcohol 09/24/2017  . Mammogram declined despite multiple attempts to get patient to go especially since her daughter with premature breast cancer 09/24/2017  . 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 Family history of breast cancer in first degree relative---> daughter- at age 5, 36nd daughter 40 08/17/2016  . Hyperalgesia- skin of feet.  07/17/2016  . Fibrocystic breast changes 07/17/2016  . GERD (gastroesophageal reflux disease) 01/19/2016  . Vitamin D deficiency 01/03/2016  . Tobacco use disorder: >30pk yr hx 01/03/2016  . Tobacco abuse counseling 01/03/2016   . Erythrocytosis due to hypoxemia 01/03/2016  . History of gastroesophageal reflux (GERD) 01/03/2016  . Environmental and seasonal allergies 01/03/2016  . Bilateral feet cramps- worse at night 12/11/2015  . Migraine 12/07/2015  . Hypothyroidism 12/07/2015  . COPD still smoking  12/07/2015  . Anxiety 12/07/2015  . Depression 12/07/2015  . Other fatigue 12/07/2015  . Absolute anemia 12/07/2015   Past Medical History:  Diagnosis Date  . Anxiety   . Arthritis   . Asthma due to seasonal allergies   . Constipation   . COPD (chronic obstructive pulmonary disease) ([place])   . Depression   . Dyslipidemia   . Dyspnea   . History of blood transfusion   . History of kidney stones    several passed  . Hypothyroidism   . Migraine    Migraines  . Neuropathic pain   . Pneumonia    frequent - last time 2015 ish  . PONV (postoperative nausea and vomiting)   . Spinal stenosis     Family History  Problem Relation Age of Onset  . Depression Mother   . Cancer Father 9       pancreatic  . Healthy Sister   . Healthy Brother   . Cancer Maternal Aunt 50       breast  . Cancer Maternal Grandmother 52       breast  . Healthy Brother   . CAD Brother 37  CABG  . Healthy Brother     Past Surgical History:  Procedure Laterality Date  . ABDOMINAL HYSTERECTOMY     total  . APPENDECTOMY    . CHOLECYSTECTOMY    . COLONOSCOPY     Social History   Occupational History  . Not on file  Tobacco Use  . Smoking status: Current Some Day Smoker    Packs/day: 0.25    Years: 34.00    Pack years: 8.50  . Smokeless tobacco: Never Used  Substance and Sexual Activity  . Alcohol use: Yes    Comment: rarely  . Drug use: No  . Sexual activity: Never

## 2018-06-12 ENCOUNTER — Telehealth (INDEPENDENT_AMBULATORY_CARE_PROVIDER_SITE_OTHER): Payer: Self-pay

## 2018-06-12 NOTE — Telephone Encounter (Signed)
-----   Message from Marybelle Killings, MD sent at 06/11/2018 11:10 AM EDT ----- Cc wc thx

## 2018-06-12 NOTE — Telephone Encounter (Signed)
Faxed office note and work note to [employer] per her request [phone]

## 2018-06-12 NOTE — Telephone Encounter (Signed)
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

## 2018-06-12 NOTE — Telephone Encounter (Signed)
faxed

## 2018-06-18 ENCOUNTER — Other Ambulatory Visit (INDEPENDENT_AMBULATORY_CARE_PROVIDER_SITE_OTHER): Payer: Self-pay | Admitting: Orthopaedic Surgery

## 2018-06-18 ENCOUNTER — Encounter: Payer: Self-pay | Admitting: Family Medicine

## 2018-06-18 NOTE — Telephone Encounter (Signed)
Called to pharmacy 

## 2018-06-18 NOTE — Telephone Encounter (Signed)
Please advise 

## 2018-06-18 NOTE — Telephone Encounter (Signed)
OK - thanks

## 2018-06-19 ENCOUNTER — Encounter: Payer: Self-pay | Admitting: Family Medicine

## 2018-06-19 ENCOUNTER — Other Ambulatory Visit: Payer: 59

## 2018-06-19 ENCOUNTER — Other Ambulatory Visit: Payer: Self-pay

## 2018-06-19 DIAGNOSIS — Z79899 Other long term (current) drug therapy: Secondary | ICD-10-CM

## 2018-06-20 LAB — ALT: ALT: 8 IU/L (ref 0–32)

## 2018-06-25 ENCOUNTER — Encounter: Payer: Self-pay | Admitting: Family Medicine

## 2018-07-01 ENCOUNTER — Other Ambulatory Visit: Payer: Self-pay

## 2018-07-01 ENCOUNTER — Ambulatory Visit (INDEPENDENT_AMBULATORY_CARE_PROVIDER_SITE_OTHER): Payer: 59 | Admitting: Family Medicine

## 2018-07-01 ENCOUNTER — Encounter: Payer: Self-pay | Admitting: Family Medicine

## 2018-07-01 VITALS — BP 131/93 | HR 107 | Temp 96.7°F | Ht 66.0 in | Wt 165.0 lb

## 2018-07-01 DIAGNOSIS — F17200 Nicotine dependence, unspecified, uncomplicated: Secondary | ICD-10-CM

## 2018-07-01 DIAGNOSIS — E7841 Elevated Lipoprotein(a): Secondary | ICD-10-CM

## 2018-07-01 DIAGNOSIS — Z981 Arthrodesis status: Secondary | ICD-10-CM | POA: Diagnosis not present

## 2018-07-01 DIAGNOSIS — J449 Chronic obstructive pulmonary disease, unspecified: Secondary | ICD-10-CM

## 2018-07-01 DIAGNOSIS — F324 Major depressive disorder, single episode, in partial remission: Secondary | ICD-10-CM | POA: Diagnosis not present

## 2018-07-01 DIAGNOSIS — Z716 Tobacco abuse counseling: Secondary | ICD-10-CM

## 2018-07-01 DIAGNOSIS — F419 Anxiety disorder, unspecified: Secondary | ICD-10-CM | POA: Diagnosis not present

## 2018-07-01 NOTE — Progress Notes (Signed)
Telehealth office visit note for Kellie Jones, D.O- at [facility]   I connected with current patient today and verified that I am speaking with the correct person using two identifiers.   . Location of the patient: Home . Location of the provider: Office Only the patient (+/- their family members at pt's discretion) and myself were participating in the encounter    - This visit type was conducted due to national recommendations for restrictions regarding the COVID-19 Pandemic (e.g. social distancing) in an effort to limit this patient's exposure and mitigate transmission in our community.  This format is felt to be most appropriate for this patient at this time.   - The patient did not have access to video technology or had technical difficulties with video requiring transitioning to audio format only. - No physical exam could be performed with this format, beyond that communicated to [place] by the patient/ family members as noted.   - Additionally my office staff/ schedulers discussed with the patient that there may be a monetary charge related to this service, depending on their medical insurance.   The patient expressed understanding, and agreed to proceed.       History of Present Illness:  Pt hasn't been back to work yet since her Back Sx per Dr Kellie Jones on 05/03/18.   She has OV on 19th of this month with him to see if she can go back to work per his recommendations.   Pt has questions about covid-19 and if she is safe to go back to work in regards to that [place].  She is Glass blower/designer.   Patient also sees Dr. Alcide Jones of pulmonology in [place].  She would like to be transferred to someone up in [place] for her COPD care.  Symptoms stable she is on Trelegy as well as using rescue inhaler several times per day.  This is no increase or worsening from her baseline.  She tells me her symptoms are stable.  Patient's mood-anxiety and depression also have been good.  Per  patient although she is worried about COVID 19 and catching and all, she says that she overall is doing very well and has no complaints today.  She continues on her medicines.     Impression and Recommendations:    1. Status post lumbar spinal fusion -Patient is doing well and following up with Dr. Lorin Jones.  Return to work will be per them.  2. Major depressive disorder in partial remission, unspecified whether recurrent (HCC) -Mood is stable.  She states she is doing well and denies any suicidal or homicidal ideations.  Although she has been a little more stressed with the surgery and everything else emotionally, she states she is doing quite well.  3. Anxiety -Symptoms stable continue current treatment  4. COPD still smoking  - Ambulatory referral to Pulmonology -Symptoms are stable and no different from her baseline.  She will continue meds until seen by her new pulmonologist. -Also encouraged patient to contact her pulmonologist regarding if they feel its necessary for her to stay home due to her COPD  5. Tobacco use disorder: >30pk yr hx -Although patient is no longer smoking 3 packs/day, she still does sneak some. -Highly, highly encouraged to completely quit - Ambulatory referral to Pulmonology  6. Tobacco abuse counseling - Ambulatory referral to Pulmonology  7. Elevated lipoprotein(a) - Lipid panel; Future -Patient was started on cholesterol meds - 20 mg of Lipitor- back mid  February or so.  She needs a repeat after 4 months or so.  This would be around mid June. -Patient to make this appointment today.  8.  Health education and counseling - Novel Covid -19 counseling done; all questions were answered.   - Current CDC and federal guidelines reviewed with patient  - Reminded pt of extreme importance of social distancing; minimizing contacts with others, avoiding ALL but emergency appts etc. - Told patient to be prepared, not scared; and be smart for the sake of others -  told to call with any concerns --> Told her from my standpoint there are no diseases I currently treat her for in which she needs to stay home due to Kellie Jones.  She was told to contact her pulmonologist to see what his thoughts are regarding her COPD.  - As part of my medical decision making, I reviewed the following data within the [place] History obtained from pt /family, CMA notes reviewed and incorporated if applicable, Labs reviewed, Radiograph/ tests reviewed if applicable and OV notes from prior OV's with me, as well as other specialists she/he has seen since seeing me last, were all reviewed and used in my medical decision making process today.   - Additionally, discussion had with patient regarding txmnt plan, and their biases/concerns about that plan were used in my medical decision making today.   - The patient agreed with the plan and demonstrated an understanding of the instructions.   No barriers to understanding were identified.   - Red flag symptoms and signs discussed in detail.  Patient expressed understanding regarding what to do in case of emergency\ urgent symptoms.  The patient was advised to call back or seek an in-person evaluation if the symptoms worsen or if the condition fails to improve as anticipated.   Return for Patient started cholesterol meds mid February-needs repeat FLP mid June w OV after.    Orders Placed This Encounter  Procedures  . Lipid panel  . Ambulatory referral to Pulmonology      I provided 16++ minutes of non-face-to-face time during this encounter,with over 50% of the time in direct counseling on patients medical conditions/ medical concerns.  Additional time was spent with charting and coordination of care after the actual visit commenced.   Note:  This note was prepared with assistance of Dragon voice recognition software. Occasional wrong-word or sound-a-like substitutions may have occurred due to the inherent limitations of  voice recognition software.  Kellie Dance, DO    -Vitals obtained; medications/ allergies reconciled;  personal medical, social, Sx etc.histories were updated by CMA, reviewed by me and are reflected in chart   Patient Care Team    Relationship Specialty Notifications Start End  Kellie Dance, DO PCP - General Family Medicine  12/07/15   Gardiner Rhyme, MD Referring Physician Pulmonary Disease  12/08/15   Marybelle Killings, MD Consulting Physician Orthopedic Surgery  02/28/17    Comment: txs her back- work related injury     Patient Active Problem List   Diagnosis Date Noted  . Excessive drinking alcohol 09/24/2017    Priority: High  . Mammogram declined despite multiple attempts to get patient to go especially since her daughter with premature breast cancer 09/24/2017    Priority: High  . 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 Family history of breast cancer in first degree relative---> daughter- at age 69, 62nd daughter 63 08/17/2016  Priority: High  . Hyperalgesia- skin of feet.  07/17/2016    Priority: High  . Tobacco use disorder: >30pk yr hx 01/03/2016    Priority: High  . Hypothyroidism 12/07/2015    Priority: High  . COPD still smoking  12/07/2015    Priority: High  . Vitamin D deficiency 01/03/2016    Priority: Medium  . Environmental and seasonal allergies 01/03/2016    Priority: Medium  . Other fatigue 12/07/2015    Priority: Medium  . Status post lumbar spinal fusion 06/11/2018    Priority: Low  . GERD (gastroesophageal reflux disease) 01/19/2016    Priority: Low  . Tobacco abuse counseling 01/03/2016    Priority: Low  . Bilateral feet cramps- worse at night 12/11/2015    Priority: Low  . Anxiety 12/07/2015    Priority: Low  . Depression 12/07/2015    Priority: Low  . Lumbar stenosis 05/03/2018  . Preop cardiovascular exam 04/22/2018  . Dyslipidemia 04/22/2018  . Elevated lipoprotein(a)  04/08/2018  . Shortness of breath on exertion 04/08/2018  . Radiculopathy, lumbar region 03/06/2018  . Chronic back pain greater than 3 months duration 09/24/2017  . Fibrocystic breast changes 07/17/2016  . Erythrocytosis due to hypoxemia 01/03/2016  . History of gastroesophageal reflux (GERD) 01/03/2016  . Migraine 12/07/2015  . Absolute anemia 12/07/2015     Current Meds  Medication Sig  . albuterol (PROVENTIL HFA;VENTOLIN HFA) 108 (90 Base) MCG/ACT inhaler Inhale 1-2 puffs into the lungs every 6 (six) hours as needed. For wheezing  . Aspirin-Salicylamide-Caffeine (BC HEADACHE) 325-95-16 MG TABS Take 1 packet by mouth 3 (three) times daily as needed (headache).   Marland Kitchen atorvastatin (LIPITOR) 20 MG tablet Take 1 tablet (20 mg total) by mouth at bedtime.  . B Complex-C (B-COMPLEX WITH VITAMIN C) tablet Take 1 tablet by mouth daily.  . bisacodyl (DULCOLAX) 5 MG EC tablet Take 5 mg by mouth daily as needed for moderate constipation.  . calcium carbonate (TUMS - DOSED IN MG ELEMENTAL CALCIUM) 500 MG chewable tablet Chew 1 tablet by mouth daily as needed for indigestion or heartburn.  . Cholecalciferol (VITAMIN D3) 5000 units CAPS Take 5,000 Units by mouth daily.   . DULoxetine HCl 40 MG CPEP Take 40 mg by mouth daily.  Marland Kitchen levothyroxine (SYNTHROID, LEVOTHROID) 150 MCG tablet Take 1 tablet (150 mcg total) by mouth daily.  . metaxalone (SKELAXIN) 800 MG tablet Take 800 mg by mouth 3 (three) times daily as needed for muscle spasms.   Marland Kitchen omeprazole (PRILOSEC) 20 MG capsule Take 20 mg by mouth as needed.  Marland Kitchen oxyCODONE (ROXICODONE) 5 MG immediate release tablet Take 1 tablet (5 mg total) by mouth every 4 (four) hours as needed.  Marland Kitchen oxyCODONE-acetaminophen (PERCOCET/ROXICET) 5-325 MG tablet Take 1 tablet by mouth every 6 (six) hours as needed for severe pain.  Marland Kitchen sertraline (ZOLOFT) 100 MG tablet Take 1 tablet (100 mg total) by mouth at bedtime.  . SUMAtriptan (IMITREX) 100 MG tablet TAKE 1 TABLET BY MOUTH  AS NEEDED. TAKE ALONG WITH 400MG  IBUPROFEN AT ONSET OF MIGRAINE. (Patient taking differently: Take 100 mg by mouth every 2 (two) hours as needed for migraine. )  . traMADol (ULTRAM) 50 MG tablet TAKE 1 TABLET BY MOUTH EVERY 6 HOURS AS NEEDED FOR PAIN.  . TRELEGY ELLIPTA 100-62.5-25 MCG/INH AEPB Take 1 puff by mouth daily.   . Vitamin D, Ergocalciferol, (DRISDOL) 1.25 MG (50000 UT) CAPS capsule Take one tablet wkly (Patient taking differently: Take 50,000 Units by mouth  every 7 (seven) days. )     Allergies:  Allergies  Allergen Reactions  . Iodinated Diagnostic Agents Swelling and Other (See Comments)    Sneezing and eye swelling  . Other Anaphylaxis    Contrast media dye  . Codeine Rash     ROS:  See above HPI for pertinent positives and negatives   Objective:   Blood pressure (!) 131/93, pulse (!) 107, temperature (!) 96.7 F (35.9 C), height 5\' 6"  (1.676 m), weight 165 lb (74.8 kg).  (if some vitals are omitted, this means that patient was UNABLE to obtain them even though they were asked to get them prior to OV today.  They were asked to call us at their earliest convenience with these once obtained. )  General: A & O * 3; sounds in no acute distress; in usual state of health.  Skin: Pt confirms warm and dry extremities and pink fingertips HEENT: Pt confirms lips non-cyanotic Chest: Patient confirms normal chest excursion and movement Respiratory: speaking in full sentences, no conversational dyspnea; patient confirms no use of accessory muscles Psych: insight appears good, mood- appears full

## 2018-07-01 NOTE — Assessment & Plan Note (Signed)
L4-5 transforaminal interbody fusion with cage-  Dr Lorin Mercy 05/03/18

## 2018-07-16 ENCOUNTER — Encounter: Payer: Self-pay | Admitting: Orthopaedic Surgery

## 2018-07-16 ENCOUNTER — Other Ambulatory Visit: Payer: Self-pay

## 2018-07-16 ENCOUNTER — Ambulatory Visit (INDEPENDENT_AMBULATORY_CARE_PROVIDER_SITE_OTHER): Payer: Worker's Compensation | Admitting: Orthopaedic Surgery

## 2018-07-16 ENCOUNTER — Ambulatory Visit (INDEPENDENT_AMBULATORY_CARE_PROVIDER_SITE_OTHER): Payer: Worker's Compensation

## 2018-07-16 VITALS — Ht 66.5 in | Wt 160.0 lb

## 2018-07-16 DIAGNOSIS — Z981 Arthrodesis status: Secondary | ICD-10-CM | POA: Diagnosis not present

## 2018-07-16 NOTE — Addendum Note (Signed)
Addended by: Meyer Cory on: 07/16/2018 11:20 AM   Modules accepted: Orders

## 2018-07-16 NOTE — Progress Notes (Signed)
Post-Op Visit Note   Patient: Kellie Jones           Date of Birth: [birth year]           MRN: [id number] Visit Date: 07/16/2018 PCP: Mellody Dance, DO   Assessment & Plan: Post lumbar fusion with fall 2 weeks ago now with difficulty ambulating.  We will set short course of physical therapy recheck 5 weeks no work x5 weeks.  She can use ibuprofen she is off pain medication and she will ambulate with a cane.  Chief Complaint:  Chief Complaint  Patient presents with  . Lower Back - Follow-up    05/03/2018 L4-5 TLIF, Pedicle Instrumentation, Cage   Visit Diagnoses:  1. Status post lumbar spinal fusion     Plan: Patient was doing well ambulating test she slipped and had a fall landing on her buttocks with increased back pain.  Ecchymosis is resolved she still been using her brace but has had difficulty getting from sitting to standing and walking due to pain in her buttocks and inner thigh.  Preop she is having more pain in her right leg but since the fall 2 weeks ago she has had more pain on the left side.  X-rays show no acute change and we will set her up for short course of physical therapy work slip given no work [employer].  Patient was reassured nothing happened the time of the fall have asked her use her cane as she ambulates.  She is off her pain medication x2 weeks.  Follow-Up Instructions: No follow-ups on file.   Orders:  Orders Placed This Encounter  Procedures  . XR Lumbar Spine 2-3 Views   No orders of the defined types were placed in this encounter.   Imaging: No results found.  PMFS History: Patient Active Problem List   Diagnosis Date Noted  . Status post lumbar spinal fusion 06/11/2018  . Lumbar stenosis 05/03/2018  . Preop cardiovascular exam 04/22/2018  . Dyslipidemia 04/22/2018  . 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 Shortness of breath on exertion 04/08/2018  . Radiculopathy, lumbar region 03/06/2018  . Chronic back pain greater than 3 months  duration 09/24/2017  . Excessive drinking alcohol 09/24/2017  . Mammogram declined despite multiple attempts to get patient to go especially since her daughter with premature breast cancer 09/24/2017  . 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 Family history of breast cancer in first degree relative---> daughter- at age 47, 81nd daughter 33 08/17/2016  . Hyperalgesia- skin of feet.  07/17/2016  . Fibrocystic breast changes 07/17/2016  . GERD (gastroesophageal reflux disease) 01/19/2016  . Vitamin D deficiency 01/03/2016  . Tobacco use disorder: >30pk yr hx 01/03/2016  . Tobacco abuse counseling 01/03/2016  . Erythrocytosis due to hypoxemia 01/03/2016  . History of gastroesophageal reflux (GERD) 01/03/2016  . Environmental and seasonal allergies 01/03/2016  . Bilateral feet cramps- worse at night 12/11/2015  . Migraine 12/07/2015  . Hypothyroidism 12/07/2015  . COPD still smoking  12/07/2015  . Anxiety 12/07/2015  . Depression 12/07/2015  . Other fatigue 12/07/2015  . Absolute anemia 12/07/2015   Past Medical History:  Diagnosis Date  . Anxiety   . Arthritis   . Asthma due to seasonal allergies   . Constipation   . COPD (chronic obstructive pulmonary disease) ([place])   . Depression   . Dyslipidemia   . Dyspnea   . History of blood transfusion   . History of kidney stones  several passed  . Hypothyroidism   . Migraine    Migraines  . Neuropathic pain   . Pneumonia    frequent - last time 2015 ish  . PONV (postoperative nausea and vomiting)   . Spinal stenosis     Family History  Problem Relation Age of Onset  . Depression Mother   . Cancer Father 32       pancreatic  . Healthy Sister   . Healthy Brother   . Cancer Maternal Aunt 20       breast  . Cancer Maternal Grandmother 23       breast  . Healthy Brother   . CAD Brother 96       CABG  . Healthy Brother     Past Surgical History:  Procedure Laterality Date  .  ABDOMINAL HYSTERECTOMY     total  . APPENDECTOMY    . CHOLECYSTECTOMY    . COLONOSCOPY     Social History   Occupational History  . Not on file  Tobacco Use  . Smoking status: Current Some Day Smoker    Packs/day: 0.25    Years: 34.00    Pack years: 8.50  . Smokeless tobacco: Never Used  Substance and Sexual Activity  . Alcohol use: Yes    Comment: rarely  . Drug use: No  . Sexual activity: Never

## 2018-07-17 ENCOUNTER — Telehealth: Payer: Self-pay

## 2018-07-17 NOTE — Telephone Encounter (Signed)
Faxed note to [facility] [phone]

## 2018-07-17 NOTE — Telephone Encounter (Signed)
-----   Message from Marybelle Killings, MD sent at 07/16/2018 10:32 AM EDT ----- Cc w/c   thx

## 2018-07-18 ENCOUNTER — Encounter: Payer: Self-pay | Admitting: Orthopaedic Surgery

## 2018-07-18 ENCOUNTER — Other Ambulatory Visit: Payer: Self-pay | Admitting: Family Medicine

## 2018-07-18 DIAGNOSIS — G43001 Migraine without aura, not intractable, with status migrainosus: Secondary | ICD-10-CM

## 2018-07-18 MED ORDER — NAPROXEN 500 MG PO TABS: 500.0000 mg | ORAL_TABLET | Freq: Two times a day (BID) | ORAL | 2 refills | Status: DC

## 2018-07-19 ENCOUNTER — Telehealth: Payer: Self-pay

## 2018-07-19 NOTE — Telephone Encounter (Signed)
Faxed rx for PT to [facility] per their request

## 2018-08-07 ENCOUNTER — Encounter: Payer: Self-pay | Admitting: Orthopaedic Surgery

## 2018-08-13 ENCOUNTER — Encounter: Payer: Self-pay | Admitting: Orthopaedic Surgery

## 2018-08-13 ENCOUNTER — Ambulatory Visit (INDEPENDENT_AMBULATORY_CARE_PROVIDER_SITE_OTHER): Payer: Worker's Compensation | Admitting: Orthopaedic Surgery

## 2018-08-13 ENCOUNTER — Ambulatory Visit (INDEPENDENT_AMBULATORY_CARE_PROVIDER_SITE_OTHER): Payer: Worker's Compensation

## 2018-08-13 ENCOUNTER — Other Ambulatory Visit: Payer: Self-pay

## 2018-08-13 ENCOUNTER — Encounter: Payer: Self-pay | Admitting: Family Medicine

## 2018-08-13 VITALS — Ht 66.5 in | Wt 160.0 lb

## 2018-08-13 DIAGNOSIS — M25531 Pain in right wrist: Secondary | ICD-10-CM

## 2018-08-13 DIAGNOSIS — Z981 Arthrodesis status: Secondary | ICD-10-CM | POA: Diagnosis not present

## 2018-08-13 DIAGNOSIS — M25551 Pain in right hip: Secondary | ICD-10-CM

## 2018-08-13 DIAGNOSIS — M545 Low back pain, unspecified: Secondary | ICD-10-CM

## 2018-08-13 MED ORDER — GABAPENTIN 100 MG PO CAPS: 100.0000 mg | ORAL_CAPSULE | Freq: Every day | ORAL | 2 refills | Status: DC

## 2018-08-13 NOTE — Progress Notes (Signed)
Office Visit Note   Patient: Kellie Jones           Date of Birth: 02/07/1961           MRN: [id number] Visit Date: 08/13/2018              Requested by: Mellody Dance, DO [place],   [id number] PCP: Mellody Dance, DO   Assessment & Plan: Visit Diagnoses:  1. Pain in right wrist   2. Pain in right hip   3. Acute bilateral low back pain, unspecified whether sciatica present   4. Status post lumbar spinal fusion     Plan: Patient can continue therapy.  We will add some Neurontin 100 mg at night she can take it for a week and then increase to 200 mg at night.  No work x1 month slip given recheck 1 month.  If she is not making progress we will have to consider reimaging versus FCE.  Follow-Up Instructions: Return in about 1 month (around 09/12/2018).   Orders:  Orders Placed This Encounter  Procedures  . XR Wrist Complete Right  . XR Lumbar Spine 2-3 Views  . XR HIP UNILAT W OR W/O PELVIS 2-3 VIEWS RIGHT   Meds ordered this encounter  Medications  . gabapentin (NEURONTIN) 100 MG capsule    Sig: Take 1 capsule (100 mg total) by mouth at bedtime.    Dispense:  60 capsule    Refill:  2    Workers comp      Procedures: No procedures performed   Clinical Data: No additional findings.   Subjective: Chief Complaint  Patient presents with  . Lower Back - Pain    Fall 08/13/2018  . Right Hip - Pain    Fall 08/13/2018  . Right Wrist - Pain    Fall 08/13/2018    HPI patient returns she fell off her front porch this morning when she was using her walker but did not have been actually in her hand at the moment.  She has been having increased pain in her right leg also has significant pain in the right wrist is concerned she may have broke her wrist.  She is not noticed any numbness or tingling in her fingers but has had pain with wrist range of motion.  She also had some pain laterally around her hip and states she is having more trouble getting  from sitting to standing and moving.  She took some Naprosyn without relief.  She continues to have burning in the bottom of her foot on the right.  L4-5T lift was done on the right side.  I talked with her on the phone and asked her to come in to be checked to make sure she did not have the acute fracture from her fall.  Review of Systems L4-5 fusion 05/03/2018.  Positive for COPD, resting tremor otherwise unchanged from previous office visit.   Objective: Vital Signs: Ht 5' 6.5" (1.689 m)   Wt 160 lb (72.6 kg)   BMI 25.44 kg/m   Physical Exam Constitutional:      Appearance: She is well-developed.  HENT:     Head: Normocephalic.     Right Ear: External ear normal.     Left Ear: External ear normal.  Eyes:     Pupils: Pupils are equal, round, and reactive to light.  Neck:     Thyroid: No thyromegaly.     Trachea: No tracheal deviation.  Cardiovascular:  Rate and Rhythm: Normal rate.  Pulmonary:     Effort: Pulmonary effort is normal.  Abdominal:     Palpations: Abdomen is soft.  Skin:    General: Skin is warm and dry.  Neurological:     Mental Status: She is alert and oriented to person, place, and time.  Psychiatric:        Behavior: Behavior normal.     Ortho Exam patient is negative straight leg raising 90 degrees.  She can dorsiflex toes right and left and still has a tremor upper and lower extremities.  Specialty Comments:  No specialty comments available.  Imaging: No results found.   PMFS History: Patient Active Problem List   Diagnosis Date Noted  . Status post lumbar spinal fusion 06/11/2018  . Lumbar stenosis 05/03/2018  . Preop cardiovascular exam 04/22/2018  . Dyslipidemia 04/22/2018  . 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 Shortness of breath on exertion 04/08/2018  . Radiculopathy, lumbar region 03/06/2018  . Chronic back pain greater than 3 months duration 09/24/2017  . Excessive drinking alcohol 09/24/2017  . Mammogram declined despite  multiple attempts to get patient to go especially since her daughter with premature breast cancer 09/24/2017  . 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 Family history of breast cancer in first degree relative---> daughter- at age 63, 36nd daughter 50 08/17/2016  . Hyperalgesia- skin of feet.  07/17/2016  . Fibrocystic breast changes 07/17/2016  . GERD (gastroesophageal reflux disease) 01/19/2016  . Vitamin D deficiency 01/03/2016  . Tobacco use disorder: >30pk yr hx 01/03/2016  . Tobacco abuse counseling 01/03/2016  . Erythrocytosis due to hypoxemia 01/03/2016  . History of gastroesophageal reflux (GERD) 01/03/2016  . Environmental and seasonal allergies 01/03/2016  . Bilateral feet cramps- worse at night 12/11/2015  . Migraine 12/07/2015  . Hypothyroidism 12/07/2015  . COPD still smoking  12/07/2015  . Anxiety 12/07/2015  . Depression 12/07/2015  . Other fatigue 12/07/2015  . Absolute anemia 12/07/2015   Past Medical History:  Diagnosis Date  . Anxiety   . Arthritis   . Asthma due to seasonal allergies   . Constipation   . COPD (chronic obstructive pulmonary disease) ([place])   . Depression   . Dyslipidemia   . Dyspnea   . History of blood transfusion   . History of kidney stones    several passed  . Hypothyroidism   . Migraine    Migraines  . Neuropathic pain   . Pneumonia    frequent - last time 2015 ish  . PONV (postoperative nausea and vomiting)   . Spinal stenosis     Family History  Problem Relation Age of Onset  . Depression Mother   . Cancer Father 25       pancreatic  . Healthy Sister   . Healthy Brother   . Cancer Maternal Aunt 79       breast  . Cancer Maternal Grandmother 35       breast  . Healthy Brother   . CAD Brother 52       CABG  . Healthy Brother     Past Surgical History:  Procedure Laterality Date  . ABDOMINAL HYSTERECTOMY     total  . APPENDECTOMY    . CHOLECYSTECTOMY    . COLONOSCOPY      Social History   Occupational History  . Not on file  Tobacco Use  . Smoking status: Current Some Day Smoker  Packs/day: 0.25    Years: 34.00    Pack years: 8.50  . Smokeless tobacco: Never Used  Substance and Sexual Activity  . Alcohol use: Yes    Comment: rarely  . Drug use: No  . Sexual activity: Never

## 2018-08-14 ENCOUNTER — Other Ambulatory Visit: Payer: 59

## 2018-08-19 ENCOUNTER — Ambulatory Visit: Payer: 59 | Admitting: Adult Health

## 2018-08-20 ENCOUNTER — Ambulatory Visit: Payer: 59 | Admitting: Orthopaedic Surgery

## 2018-08-21 ENCOUNTER — Telehealth: Payer: Self-pay | Admitting: Orthopaedic Surgery

## 2018-08-21 NOTE — Telephone Encounter (Signed)
Faxed 08/13/2018 ov note to [place] whom provides medical & disability management services [phone]

## 2018-08-26 ENCOUNTER — Telehealth: Payer: Self-pay

## 2018-08-26 NOTE — Telephone Encounter (Signed)
Fine with me

## 2018-08-26 NOTE — Telephone Encounter (Signed)
Pt is requesting to switch to [place] office, due to this office being closer to her home.  Pt is aware of office protocol.   DR and MW please advise if okay to switch? Thanks

## 2018-08-27 NOTE — Telephone Encounter (Signed)
ok 

## 2018-08-27 NOTE — Telephone Encounter (Signed)
Pt has been scheduled for OV on 09/11/2018 at 4:00. Pt has been provided with directions and contact number. Nothing further is needed.

## 2018-09-03 ENCOUNTER — Other Ambulatory Visit: Payer: Self-pay

## 2018-09-03 ENCOUNTER — Other Ambulatory Visit (INDEPENDENT_AMBULATORY_CARE_PROVIDER_SITE_OTHER): Payer: 59

## 2018-09-03 DIAGNOSIS — E7841 Elevated Lipoprotein(a): Secondary | ICD-10-CM

## 2018-09-04 ENCOUNTER — Telehealth: Payer: Self-pay | Admitting: Internal Medicine

## 2018-09-04 LAB — SPECIMEN STATUS

## 2018-09-04 LAB — LIPID PANEL
Chol/HDL Ratio: 3.7 ratio (ref 0.0–4.4)
Cholesterol, Total: 185 mg/dL (ref 100–199)
HDL: 50 mg/dL (ref >39)
LDL Calculated: 117 mg/dL — ABNORMAL HIGH (ref 0–99)
Triglycerides: 91 mg/dL (ref 0–149)
VLDL Cholesterol Cal: 18 mg/dL (ref 5–40)

## 2018-09-04 NOTE — Telephone Encounter (Signed)

## 2018-09-05 ENCOUNTER — Encounter: Payer: Self-pay | Admitting: Internal Medicine

## 2018-09-05 ENCOUNTER — Ambulatory Visit: Payer: 59 | Admitting: Internal Medicine

## 2018-09-05 ENCOUNTER — Other Ambulatory Visit: Payer: Self-pay

## 2018-09-05 VITALS — BP 122/84 | HR 97 | Temp 97.3°F | Ht 66.0 in | Wt 153.0 lb

## 2018-09-05 DIAGNOSIS — J449 Chronic obstructive pulmonary disease, unspecified: Secondary | ICD-10-CM

## 2018-09-05 DIAGNOSIS — F17210 Nicotine dependence, cigarettes, uncomplicated: Secondary | ICD-10-CM

## 2018-09-05 MED ORDER — TRELEGY ELLIPTA 100-62.5-25 MCG/INH IN AEPB: 1.0000 | INHALATION_SPRAY | Freq: Every day | RESPIRATORY_TRACT | 10 refills | Status: DC

## 2018-09-05 NOTE — Progress Notes (Signed)
[facility] Consultation      Assessment and Plan:  COPD/emphysema.  Group D with 2 exacerbations thus far in 2020. - Severe emphysematous changes seen on recent lung function testing.  Discussed with the patient that given her advanced emphysema with multiple exacerbations and chronic dyspnea on exertion she is at high risk of complications including hospitalization and requiring a ventilator. - Continue Trelegy inhaler, use albuterol as needed.  - We will obtain records and PFT results from her pulmonologist in [place], Dr. Vonzella Nipple.  Dyspnea on exertion, respiratory failure. - Discussed trying to increase her physical activity. - Discussed the importance of smoke cessation. - Will obtain overnight oximetry.  Nicotine abuse. - Discussed the importance smoke cessation, even casual smoking is dangerous for her considering her advanced emphysema.  Greater than 50% of the 25 minute visit was spent in counseling/coordination of care regarding dyspnea and COPD.    Orders Placed This Encounter  Procedures  . Pulse oximetry, overnight   Meds ordered this encounter  Medications  . Fluticasone-Umeclidin-Vilant (TRELEGY ELLIPTA) 100-62.5-25 MCG/INH AEPB    Sig: Inhale 1 applicator into the lungs daily. Rinse mouth after use.    Dispense:  1 each    Refill:  10      Date: 09/05/2018  MRN# [id number] Kellie Jones 1960-12-16    Georgeann Brinkman is a 58 y.o. old female seen in consultation for chief complaint of:    Chief Complaint  Patient presents with  . Follow-up    Transferring from Dr.Chodri in [place] like breathing is getting worse. Using rescue inhaler 5x qd.    HPI:  Kellie Jones is a 58 y.o. old female  previously seen by Dr. Melvyn Novas in November of 2017 with COPD, GERD. She was enrolled in lung cancer screening.  Since then she was seeing a lung doctor down in [place] but wants to move back into [facility] and this is closer to home.  She has  good days and bad days, it is worse when he humidity is high. She is a smoker of 2-3 cigs per day which she does casually. She smoked 3 ppd until she quit last November, she restarted about 3 months ago.   She has had a flareup that required steroids in March of this year, and another in April or May.  She had dyspnea with short distances, she sits in the bathtub.  She is using Trelegy once daily rinses mouth, rescue inhaler 3-4 times per day.  She has a neb but does not have to use it. She does not have medication for it.   She has a dog not in bedroom.  Has sinus drainage.  She has never been tested for allergies.  She has occasional reflux, controlled with PPI.   **CT low-dose 09/05/2018>> images personally reviewed, diffuse emphysematous changes, worse in the apices.  6 mm nodule was seen in the posterior right lower lobe, was recommended to repeat low-dose CT chest in 12 months.    PMHX:   Past Medical History:  Diagnosis Date  . Anxiety   . Arthritis   . Asthma due to seasonal allergies   . Constipation   . COPD (chronic obstructive pulmonary disease) ([place])   . Depression   . Dyslipidemia   . Dyspnea   . History of blood transfusion   . History of kidney stones    several passed  . Hypothyroidism   . Migraine    Migraines  . Neuropathic pain   .  Pneumonia    frequent - last time 2015 ish  . PONV (postoperative nausea and vomiting)   . Spinal stenosis    Surgical Hx:  Past Surgical History:  Procedure Laterality Date  . ABDOMINAL HYSTERECTOMY     total  . APPENDECTOMY    . CHOLECYSTECTOMY    . COLONOSCOPY     Family Hx:  Family History  Problem Relation Age of Onset  . Depression Mother   . Cancer Father 18       pancreatic  . Healthy Sister   . Healthy Brother   . Cancer Maternal Aunt 7       breast  . Cancer Maternal Grandmother 65       breast  . Healthy Brother   . CAD Brother 71       CABG  . Healthy Brother    Social Hx:   Social History    Tobacco Use  . Smoking status: Current Some Day Smoker    Packs/day: 0.25    Years: 34.00    Pack years: 8.50  . Smokeless tobacco: Never Used  Substance Use Topics  . Alcohol use: Yes    Comment: rarely  . Drug use: No   Medication:    Current Outpatient Medications:  .  albuterol (PROVENTIL HFA;VENTOLIN HFA) 108 (90 Base) MCG/ACT inhaler, Inhale 1-2 puffs into the lungs every 6 (six) hours as needed. For wheezing, Disp: 1 Inhaler, Rfl: 0 .  Aspirin-Salicylamide-Caffeine (BC HEADACHE) 325-95-16 MG TABS, Take 1 packet by mouth 3 (three) times daily as needed (headache). , Disp: , Rfl:  .  atorvastatin (LIPITOR) 20 MG tablet, Take 1 tablet (20 mg total) by mouth at bedtime., Disp: 90 tablet, Rfl: 3 .  B Complex-C (B-COMPLEX WITH VITAMIN C) tablet, Take 1 tablet by mouth daily., Disp: , Rfl:  .  bisacodyl (DULCOLAX) 5 MG EC tablet, Take 5 mg by mouth daily as needed for moderate constipation., Disp: , Rfl:  .  calcium carbonate (TUMS - DOSED IN MG ELEMENTAL CALCIUM) 500 MG chewable tablet, Chew 1 tablet by mouth daily as needed for indigestion or heartburn., Disp: , Rfl:  .  Cholecalciferol (VITAMIN D3) 5000 units CAPS, Take 5,000 Units by mouth daily. , Disp: , Rfl:  .  DULoxetine HCl 40 MG CPEP, Take 40 mg by mouth daily., Disp: 90 capsule, Rfl: 1 .  gabapentin (NEURONTIN) 100 MG capsule, Take 1 capsule (100 mg total) by mouth at bedtime., Disp: 60 capsule, Rfl: 2 .  levothyroxine (SYNTHROID, LEVOTHROID) 150 MCG tablet, Take 1 tablet (150 mcg total) by mouth daily., Disp: 90 tablet, Rfl: 1 .  metaxalone (SKELAXIN) 800 MG tablet, Take 800 mg by mouth 3 (three) times daily as needed for muscle spasms. , Disp: , Rfl:  .  naproxen (NAPROSYN) 500 MG tablet, Take 1 tablet (500 mg total) by mouth 2 (two) times daily with a meal., Disp: 60 tablet, Rfl: 2 .  omeprazole (PRILOSEC) 20 MG capsule, Take 20 mg by mouth as needed., Disp: , Rfl:  .  oxyCODONE (ROXICODONE) 5 MG immediate release tablet,  Take 1 tablet (5 mg total) by mouth every 4 (four) hours as needed., Disp: 30 tablet, Rfl: 0 .  oxyCODONE-acetaminophen (PERCOCET/ROXICET) 5-325 MG tablet, Take 1 tablet by mouth every 6 (six) hours as needed for severe pain., Disp: 30 tablet, Rfl: 0 .  sertraline (ZOLOFT) 100 MG tablet, Take 1 tablet (100 mg total) by mouth at bedtime., Disp: 90 tablet, Rfl: 1 .  SUMAtriptan (IMITREX) 100 MG tablet, TAKE 1 TABLET BY MOUTH AS NEEDED. TAKE ALONG WITH 400MG  IBUPROFEN AT ONSET OF MIGRAINE., Disp: 10 tablet, Rfl: 1 .  traMADol (ULTRAM) 50 MG tablet, TAKE 1 TABLET BY MOUTH EVERY 6 HOURS AS NEEDED FOR PAIN., Disp: 40 tablet, Rfl: 0 .  TRELEGY ELLIPTA 100-62.5-25 MCG/INH AEPB, Take 1 puff by mouth daily. , Disp: , Rfl: 3 .  Vitamin D, Ergocalciferol, (DRISDOL) 1.25 MG (50000 UT) CAPS capsule, Take one tablet wkly (Patient taking differently: Take 50,000 Units by mouth every 7 (seven) days. ), Disp: 12 capsule, Rfl: 1   Allergies:  Iodinated diagnostic agents, Other, and Codeine  Review of Systems: Gen:  Denies  fever, sweats, chills HEENT: Denies blurred vision, double vision. bleeds, sore throat Cvc:  No dizziness, chest pain. Resp:   Denies cough or sputum production, shortness of breath Gi: Denies swallowing difficulty, stomach pain. Gu:  Denies bladder incontinence, burning urine Ext:   No Joint pain, stiffness. Skin: No skin rash,  hives  Endoc:  No polyuria, polydipsia. Psych: No depression, insomnia. Other:  All other systems were reviewed with the patient and were negative other that what is mentioned in the HPI.   Physical Examination:   VS: BP 122/84 (BP Location: Left Arm, Cuff Size: Normal)   Pulse 97   Temp (!) 97.3 F (36.3 C) (Skin)   Ht 5\' 6"  (1.676 m)   Wt 153 lb (69.4 kg)   SpO2 95%   BMI 24.69 kg/m   General Appearance: No distress  Neuro:without focal findings,  speech normal,  HEENT: PERRLA, EOM intact.   Pulmonary: normal breath sounds, No wheezing. Decreased air  entry bilaterally.  CardiovascularNormal S1,S2.  No m/r/g.   Abdomen: Benign, Soft, non-tender. Renal:  No costovertebral tenderness  GU:  No performed at this time. Endoc: No evident thyromegaly, no signs of acromegaly. Skin:   warm, no rashes, no ecchymosis  Extremities: normal, no cyanosis, clubbing.  Other findings:    LABORATORY PANEL:   CBC Recent Labs  Lab 09/03/18 0901  WBC WILL FOLLOW  HGB WILL FOLLOW  HCT WILL FOLLOW  PLT WILL FOLLOW   ------------------------------------------------------------------------------------------------------------------  Chemistries  No results for input(s): NA, K, CL, CO2, GLUCOSE, BUN, CREATININE, CALCIUM, MG, AST, ALT, ALKPHOS, BILITOT in the last 168 hours.  Invalid input(s): GFRCGP ------------------------------------------------------------------------------------------------------------------  Cardiac Enzymes No results for input(s): TROPONINI in the last 168 hours. ------------------------------------------------------------  RADIOLOGY:  No results found.     Thank  you for the consultation and for allowing [facility] to assist in the care of your patient. Our recommendations are noted above.  Please contact us if we can be of further service.   Marda Stalker, M.D., F.C.C.P.  Board Certified in Internal Medicine, Pulmonary Medicine, [place], and Sleep Medicine.  [facility] Office Number: [phone]   09/05/2018

## 2018-09-05 NOTE — Patient Instructions (Addendum)
Will need to obtain notes and PFT from your pulmonologist in [place].  Will check oxygen test overnight.

## 2018-09-11 ENCOUNTER — Institutional Professional Consult (permissible substitution): Payer: 59 | Admitting: Internal Medicine

## 2018-09-13 ENCOUNTER — Ambulatory Visit: Payer: 59 | Admitting: Orthopaedic Surgery

## 2018-09-16 NOTE — Telephone Encounter (Signed)
Kellie Jones, can you help with this.  Pt has not been contacted for ONO.

## 2018-09-17 ENCOUNTER — Encounter: Payer: Self-pay | Admitting: Orthopaedic Surgery

## 2018-09-17 ENCOUNTER — Ambulatory Visit (INDEPENDENT_AMBULATORY_CARE_PROVIDER_SITE_OTHER): Payer: Worker's Compensation | Admitting: Orthopaedic Surgery

## 2018-09-17 ENCOUNTER — Ambulatory Visit (INDEPENDENT_AMBULATORY_CARE_PROVIDER_SITE_OTHER): Payer: Worker's Compensation

## 2018-09-17 ENCOUNTER — Other Ambulatory Visit: Payer: Self-pay

## 2018-09-17 VITALS — Ht 66.0 in | Wt 153.0 lb

## 2018-09-17 DIAGNOSIS — M545 Low back pain, unspecified: Secondary | ICD-10-CM

## 2018-09-17 DIAGNOSIS — Z981 Arthrodesis status: Secondary | ICD-10-CM | POA: Diagnosis not present

## 2018-09-17 MED ORDER — GABAPENTIN 300 MG PO CAPS: 300.0000 mg | ORAL_CAPSULE | Freq: Every day | ORAL | 2 refills | Status: DC

## 2018-09-17 NOTE — Progress Notes (Signed)
Office Visit Note   Patient: Kellie Jones           Date of Birth: 10/01/1960           MRN: [id number] Visit Date: 09/17/2018              Requested by: Mellody Dance, DO [place],  [place] [address] PCP: Mellody Dance, DO   Assessment & Plan: Visit Diagnoses:  1. Acute bilateral low back pain, unspecified whether sciatica present   2. Status post lumbar spinal fusion     Plan: Patient was seen by outpatient physical therapy approved by [employer] who recommended she go to water therapy which is currently not available due to [place].  She states she has a brother who has a pool and she gets in the pool and walks in the pool.  Work slip given no work x1 month I will recheck her in 1 month and increase her gabapentin to 300 mg at night.  She has 12 tablets left and will take 200 mg at night till she runs out of her current amount and then start taking 300 mg at night.  She is also taking Naprosyn and is not on any narcotic medication currently.  Patient does not make improvement we may need to consider EMGs nerve conduction velocities lower extremities and consider an FCE.  Recheck 1 month.  Follow-Up Instructions: Return in about 1 month (around 10/18/2018).   Orders:  Orders Placed This Encounter  Procedures  . XR Lumbar Spine Complete   Meds ordered this encounter  Medications  . gabapentin (NEURONTIN) 300 MG capsule    Sig: Take 1 capsule (300 mg total) by mouth at bedtime.    Dispense:  30 capsule    Refill:  2    Workers comp      Procedures: No procedures performed   Clinical Data: No additional findings.   Subjective: Chief Complaint  Patient presents with  . Lower Back - Follow-up    Fall 08/13/2018 05/03/2018 L4-5 TLIF, Pedicle Instrumentation, Cage  . Right Hip - Follow-up    Fall 08/13/2018  . Right Wrist - Follow-up    Fall 08/13/2018    HPI 58 year old female returns now 4-1/2 months post single level L4-5 fusion.  She still  ambulatory without a cane but puts her hands on her hips when she walks she states her back is hurting she states at times it hurts worse than before surgery.  She states she has back pain and right leg pain.  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  Date of injury was 10/23/2016.  Patient had a fall on 08/13/2018 and x-ray showed no evidence of acute injury to either a back pelvis or hips.  She states she had another episode where her leg gave way but she landed on carpet.  She does have a cane and I encouraged her to use it.  MRI scan before her surgery did not show any other levels of compression.  Patient has some anterolisthesis at L4-5 noted on flexion-extension x-rays which was documented with motion on myelogram/CT at the L4-5 level only.  Review of Systems Positive for anxiety depression COPD still smoking.  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  Objective: Vital Signs: Ht 5\' 6"  (1.676 m)   Wt 153 lb (69.4 kg)   BMI 24.69 kg/m   Physical Exam Constitutional:      Appearance: She is well-developed.  HENT:     Head: Normocephalic.     Right Ear: External ear normal.     Left Ear: External ear normal.  Eyes:     Pupils: Pupils are equal, round, and reactive to light.  Neck:     Thyroid: No thyromegaly.     Trachea: No tracheal deviation.  Cardiovascular:     Rate and Rhythm: Normal rate.  Pulmonary:     Effort: Pulmonary effort is normal.  Abdominal:     Palpations: Abdomen is soft.  Skin:    General: Skin is warm and dry.  Neurological:     Mental Status: She is alert and oriented to person, place, and time.  Psychiatric:        Behavior: Behavior normal.     Ortho Exam lumbar incision is well-healed.  No sciatic notch tenderness.  Negative logroll to the hips.  Anterior tib EHL is strong gastrocsoleus is strong.  Negative Homan.  No pitting edema.  Specialty Comments:  No specialty comments available.  Imaging: No  results found.   PMFS History: Patient Active Problem List   Diagnosis Date Noted  . Status post lumbar spinal fusion 06/11/2018  . Lumbar stenosis 05/03/2018  . Preop cardiovascular exam 04/22/2018  . Dyslipidemia 04/22/2018  . 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 Shortness of breath on exertion 04/08/2018  . Radiculopathy, lumbar region 03/06/2018  . Chronic back pain greater than 3 months duration 09/24/2017  . Excessive drinking alcohol 09/24/2017  . Mammogram declined despite multiple attempts to get patient to go especially since her daughter with premature breast cancer 09/24/2017  . 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 Family history of breast cancer in first degree relative---> daughter- at age 90, 31nd daughter 51 08/17/2016  . Hyperalgesia- skin of feet.  07/17/2016  . Fibrocystic breast changes 07/17/2016  . GERD (gastroesophageal reflux disease) 01/19/2016  . Vitamin D deficiency 01/03/2016  . Tobacco use disorder: >30pk yr hx 01/03/2016  . Tobacco abuse counseling 01/03/2016  . Erythrocytosis due to hypoxemia 01/03/2016  . History of gastroesophageal reflux (GERD) 01/03/2016  . Environmental and seasonal allergies 01/03/2016  . Bilateral feet cramps- worse at night 12/11/2015  . Migraine 12/07/2015  . Hypothyroidism 12/07/2015  . COPD still smoking  12/07/2015  . Anxiety 12/07/2015  . Depression 12/07/2015  . Other fatigue 12/07/2015  . Absolute anemia 12/07/2015   Past Medical History:  Diagnosis Date  . Anxiety   . Arthritis   . Asthma due to seasonal allergies   . Constipation   . COPD (chronic obstructive pulmonary disease) ([place])   . Depression   . Dyslipidemia   . Dyspnea   . History of blood transfusion   . History of kidney stones    several passed  . Hypothyroidism   . Migraine    Migraines  . Neuropathic pain   . Pneumonia    frequent - last time 2015 ish  . PONV (postoperative nausea and  vomiting)   . Spinal stenosis     Family History  Problem Relation Age of Onset  . Depression Mother   . Cancer Father 80       pancreatic  . Healthy Sister   . Healthy Brother   . Cancer Maternal Aunt 61       breast  . Cancer Maternal Grandmother 41       breast  . Healthy Brother   . CAD Brother 42  CABG  . Healthy Brother     Past Surgical History:  Procedure Laterality Date  . ABDOMINAL HYSTERECTOMY     total  . APPENDECTOMY    . CHOLECYSTECTOMY    . COLONOSCOPY     Social History   Occupational History  . Not on file  Tobacco Use  . Smoking status: Current Some Day Smoker    Packs/day: 0.25    Years: 34.00    Pack years: 8.50  . Smokeless tobacco: Never Used  Substance and Sexual Activity  . Alcohol use: Yes    Comment: rarely  . Drug use: No  . Sexual activity: Never

## 2018-09-23 ENCOUNTER — Telehealth: Payer: Self-pay

## 2018-09-23 NOTE — Telephone Encounter (Signed)
Faxed office note and work note to [employer] [phone]

## 2018-09-24 ENCOUNTER — Encounter: Payer: Self-pay | Admitting: Internal Medicine

## 2018-09-26 ENCOUNTER — Telehealth: Payer: Self-pay | Admitting: Internal Medicine

## 2018-09-26 DIAGNOSIS — J449 Chronic obstructive pulmonary disease, unspecified: Secondary | ICD-10-CM

## 2018-09-26 DIAGNOSIS — J439 Emphysema, unspecified: Secondary | ICD-10-CM

## 2018-09-26 NOTE — Telephone Encounter (Signed)
ONO reviewed by DR- 1L QHS is recommended. Lowest SPO2 84%. ATC pt to relay results- no answer with no option to leave voicemail.

## 2018-09-27 NOTE — Telephone Encounter (Signed)
Pt is aware of below results/recommendations and voiced her understanding.  Pt wished to proceed with 1L QHS.  Order has been placed.  Nothing further is needed at this time.

## 2018-09-30 ENCOUNTER — Telehealth: Payer: Self-pay | Admitting: Internal Medicine

## 2018-09-30 MED ORDER — TRELEGY ELLIPTA 100-62.5-25 MCG/INH IN AEPB: 1.0000 | INHALATION_SPRAY | Freq: Every day | RESPIRATORY_TRACT | 10 refills | Status: AC

## 2018-09-30 NOTE — Telephone Encounter (Signed)
Pt called for refill on Trelegy which was refilled on 09/05/2018 by Dr.Ram. Pt stated that [facility] stated that they did not receive the prescription. Advised pt that we would resend the rx.

## 2018-10-08 ENCOUNTER — Other Ambulatory Visit: Payer: Self-pay

## 2018-10-08 ENCOUNTER — Encounter: Payer: Self-pay | Admitting: Family Medicine

## 2018-10-08 ENCOUNTER — Ambulatory Visit: Payer: Managed Care, Other (non HMO) | Admitting: Family Medicine

## 2018-10-08 VITALS — BP 140/84 | HR 76 | Temp 98.3°F | Ht 66.0 in | Wt 153.5 lb

## 2018-10-08 DIAGNOSIS — E039 Hypothyroidism, unspecified: Secondary | ICD-10-CM | POA: Diagnosis not present

## 2018-10-08 DIAGNOSIS — F1010 Alcohol abuse, uncomplicated: Secondary | ICD-10-CM | POA: Diagnosis not present

## 2018-10-08 DIAGNOSIS — J449 Chronic obstructive pulmonary disease, unspecified: Secondary | ICD-10-CM

## 2018-10-08 DIAGNOSIS — D649 Anemia, unspecified: Secondary | ICD-10-CM

## 2018-10-08 DIAGNOSIS — E7841 Elevated Lipoprotein(a): Secondary | ICD-10-CM

## 2018-10-08 DIAGNOSIS — E785 Hyperlipidemia, unspecified: Secondary | ICD-10-CM | POA: Insufficient documentation

## 2018-10-08 DIAGNOSIS — F17200 Nicotine dependence, unspecified, uncomplicated: Secondary | ICD-10-CM | POA: Diagnosis not present

## 2018-10-08 DIAGNOSIS — K219 Gastro-esophageal reflux disease without esophagitis: Secondary | ICD-10-CM

## 2018-10-08 MED ORDER — ATORVASTATIN CALCIUM 40 MG PO TABS: 40.0000 mg | ORAL_TABLET | Freq: Every day | ORAL | 1 refills | Status: DC

## 2018-10-08 MED ORDER — OMEPRAZOLE 20 MG PO CPDR: 20.0000 mg | DELAYED_RELEASE_CAPSULE | Freq: Two times a day (BID) | ORAL | 1 refills | Status: AC

## 2018-10-08 NOTE — Progress Notes (Signed)
Assessment and plan:  1. COPD still smoking    2. Excessive drinking alcohol   3. Tobacco use disorder: >30pk yr hx   4. Hypothyroidism, unspecified type   5. Hyperlipidemia, unspecified hyperlipidemia type- NEW ONSET   6. Elevated lipoprotein(a)   7. Hemoglobin low   8. Gastroesophageal reflux disease, esophagitis presence not specified     - Reviewed recent lab work (09/03/2018) in depth with patient today.    All lab work within normal limits unless otherwise noted.    Extensive education was provided and all questions were answered.   Borderline Elevated Blood Pressure - Not optimal during intake today.  - Discussed goal BP of less than 140/80, and prefer <120/80.  - Ambulatory monitoring encouraged!  If not better controlled at home with BP monitoring- PT WILL ET [place] KNOW in 2-3 wks. -Up to this point have not changed BP meds as every time she has come into the clinic she has been in a significant amount of pain from her chronic back issues.   - Advised patient to keep a close eye on her BP and look out for symptoms.  - Will continue to monitor.  Hyperlipidemia - Recent Lipid Panel LDL = 117 HDL = 50 Triglycerides = 91  - Discussed that changes in cholesterol levels are likely due to patient's inactivity since exacerbation of her back concerns.  The 10-year ASCVD risk score [name], et al., 2013) is: 6.6%   Values used to calculate the score:     Age: 58 years     Sex: Female     Is Non-Hispanic African American: No     Diabetic: No     Tobacco smoker: Yes     Systolic Blood Pressure: 253 mmHg     Is BP treated: No     HDL Cholesterol: 50 mg/dL     Total Cholesterol: 185 mg/dL   - Managed on Lipitor. - Discussed increasing dose of Lipitor today. - Up dose to 40 mg per night from 20 mg prior.  - In addition, prudent dietary changes such as low saturated & trans fat and low carb diets  discussed with patient.  Encouraged regular exercise as tolerated and physical conditioning when appropriate.  - Educational handouts provided at patient's desire.   Tobacco Abuse & Smoking Cessation Counseling Told pt to think seriously about quitting smoking!  Told pt it is very important for his/her health and well being.    Smoking cessation instruction/counseling given:  counseled patient on the dangers of tobacco use, advised patient to stop smoking, and reviewed strategies to maximize success  Discussed with patient that there are multiple treatments to aid in quitting smoking, however I explained none will work unless pt really want to quit  Told to call 1-800-QUIT-NOW (708)350-8032) for free smoking cessation counseling and support, or pt can go online to www.heart.org - the American Heart Association website and search "quit smoking ".   COPD, Current Smoker - Managed by Pulmonology - Patient knows to continue to follow-up with pulmonology as established.  She continues to have conversational dyspnea - Advised patient to ask whether she is a candidate for pulmonary rehab.  - Continue treatment plan as managed by pulmonology. - Patient knows critical importance of being assessed and managed by pulmonology.   GERD - Not optimally controlled at this time. - Take omeprazole twice daily before meals.  See med list. - Will continue to monitor.  Chronic  Back Pain - Followed by Orthopedics - Advised patient to continue follow-up  & management with specialist as recommended. - Patient knows to obtain muscle relaxers through specialist as discussed. - Will continue to monitor.  Recommendations - Need to check blood counts as advised.  See orders.  Discussed need for patient to continue to obtain management and screenings with all established specialists (pulmonology, orthopedics).  Educated patient at length about the critical importance of keeping health maintenance up to date.   - Participated in lengthy conversation and all questions were answered.    Education and routine counseling performed. Handouts provided.  Orders Placed This Encounter  Procedures  . CBC    Meds ordered this encounter  Medications  . omeprazole (PRILOSEC) 20 MG capsule    Sig: Take 1 capsule (20 mg total) by mouth 2 (two) times daily before a meal.    Dispense:  180 capsule    Refill:  1  . atorvastatin (LIPITOR) 40 MG tablet    Sig: Take 1 tablet (40 mg total) by mouth at bedtime.    Dispense:  90 tablet    Refill:  1    Medications Discontinued During This Encounter  Medication Reason  . naproxen (NAPROSYN) 500 MG tablet   . atorvastatin (LIPITOR) 20 MG tablet Reorder  . omeprazole (PRILOSEC) 20 MG capsule Reorder      Return for 10mo f/up - reck bp, .   Anticipatory guidance and routine counseling done re: condition, txmnt options and need for follow up. All questions of patient's were answered.   Gross side effects, risk and benefits, and alternatives of medications discussed with patient.  Patient is aware that all medications have potential side effects and we are unable to predict every sideeffect or drug-drug interaction that may occur.  Expresses verbal understanding and consents to current therapy plan and treatment regiment.  Please see AVS handed out to patient at the end of our visit for additional patient instructions/ counseling done pertaining to today's office visit.  Note:  This document was prepared using Dragon voice recognition software and may include unintentional dictation errors.    This document serves as a record of services personally performed by Mellody Dance, DO. It was created on her behalf by Toni Amend, a trained medical scribe. The creation of this record is based on the scribe's personal observations and the provider's statements to them.   I have reviewed the above medical documentation for accuracy and completeness and I  concur.  Mellody Dance, DO 10/10/2018 7:18 PM     ----------------------------------------------------------------------------------------------------------------------  Subjective:   CC:   Kellie Jones is a 58 y.o. female who presents to [place] [facility] today for review and discussion of recent bloodwork that was done in addition to f/up on chronic conditions we are managing for pt.  1. All recent blood work that we ordered was reviewed with patient today.  Patient was counseled on all abnormalities and we discussed dietary and lifestyle changes that could help those values (also medications when appropriate).  Extensive health counseling performed and all patient's concerns/ questions were addressed.  See labs below and also plan for more details of these abnormalities  Patient states she is allergic to codeine and iodine.  Feels her blood pressure is usually around 145/85 at home.  GERD Says her reflux is awful.  Even drinking bottle water, notes "I got to belching."  Mood Management - Prescribed Gabapentin by Orthopedics Says her mood has gotten  worse, but she's "tried to look up."  On current management, things are "not as bleak."  Says "It's enough to give me peace of mind."  Further notes that current events are "it's like you're in a twilight movie.  You're looking down at yourself."  Says she called her daughter recently and "couldn't get the words out of her lips."  She has been prescribed gabapentin.  Notes prescribing doctor decreased her dose, and she's been managed on 300.  Says "I feel like the walking dead."  Says she feels tired and drugged out on the Neurontin and doesn't feel like it's helping her mood or anything else.  Says "I can't function."  Says "since my back is not fixed, I can't get out and be physical like I want to."  Pulmonology F/up Says she changed one of her doctors; she is now seeing Dr. Ashby Dawes of Pulmonology.  Notes  she likes this doctor.  Says he ordered oxygen, "nighttime only," and patient started this last night.  They determined this after an overnight "meter" test.  Says "he came to watch, too my blood to make sure and all that good stuff."  Says she also had to wear a little thing on her finger for eight hours.  After all this testing, it was deduced that patient needs oxygen at night.  She states she does hope she gets more energy during the day after ongoing treatment.  Pulmonology did not make any changes with her meds.  Tobacco Abuse Patient does not wish to quit smoking.  States a pack will last her 3-4 days.  Alcohol Use Says she has "one beer every once in a while."  Otherwise "I drink nothin.'"  Says she drinks about two per day at the most.     Wt Readings from Last 3 Encounters:  10/18/18 153 lb (69.4 kg)  10/08/18 153 lb 8 oz (69.6 kg)  09/17/18 153 lb (69.4 kg)   BP Readings from Last 3 Encounters:  10/18/18 113/82  10/08/18 140/84  09/05/18 122/84   Pulse Readings from Last 3 Encounters:  10/18/18 98  10/08/18 76  09/05/18 97   BMI Readings from Last 3 Encounters:  10/18/18 24.69 kg/m  10/08/18 24.78 kg/m  09/17/18 24.69 kg/m     Patient Care Team    Relationship Specialty Notifications Start End  Mellody Dance, DO PCP - General Family Medicine  12/07/15   Marybelle Killings, MD Consulting Physician Orthopedic Surgery  02/28/17    Comment: txs her back- work related injury  Laverle Hobby, MD Consulting Physician Pulmonary Disease  10/08/18     Full medical history updated and reviewed in the office today  Patient Active Problem List   Diagnosis Date Noted  . h/o Excessive drinking alcohol 09/24/2017    Priority: High  . Mammogram declined despite multiple attempts to get patient to go especially since her daughter with premature breast cancer 09/24/2017    Priority: High  . 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 Family history of breast cancer in first degree relative---> daughter- at age 31, 67nd daughter 10 08/17/2016    Priority: High  . Hyperalgesia- skin of feet.  07/17/2016    Priority: High  . Tobacco use disorder: >30pk yr hx 01/03/2016    Priority: High  . Hypothyroidism 12/07/2015    Priority: High  . COPD still smoking  12/07/2015    Priority: High  .  Vitamin D deficiency 01/03/2016    Priority: Medium  . Environmental and seasonal allergies 01/03/2016    Priority: Medium  . Other fatigue 12/07/2015    Priority: Medium  . Status post lumbar spinal fusion 06/11/2018    Priority: Low  . GERD (gastroesophageal reflux disease) 01/19/2016    Priority: Low  . Tobacco abuse counseling 01/03/2016    Priority: Low  . Bilateral feet cramps- worse at night 12/11/2015    Priority: Low  . Anxiety 12/07/2015    Priority: Low  . Depression 12/07/2015    Priority: Low  . Hyperlipidemia 10/08/2018  . Lumbar stenosis 05/03/2018  . Preop cardiovascular exam 04/22/2018  . Dyslipidemia 04/22/2018  . 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 Shortness of breath on exertion 04/08/2018  . Radiculopathy, lumbar region 03/06/2018  . Chronic back pain greater than 3 months duration 09/24/2017  . Fibrocystic breast changes 07/17/2016  . Erythrocytosis due to hypoxemia 01/03/2016  . History of gastroesophageal reflux (GERD) 01/03/2016  . Migraine 12/07/2015  . Absolute anemia 12/07/2015    Past Medical History:  Diagnosis Date  . Anxiety   . Arthritis   . Asthma due to seasonal allergies   . Constipation   . COPD (chronic obstructive pulmonary disease) ([place])   . Depression   . Dyslipidemia   . Dyspnea   . History of blood transfusion   . History of kidney stones    several passed  . Hypothyroidism   . Migraine    Migraines  . Neuropathic pain   . Pneumonia    frequent - last time 2015 ish  . PONV (postoperative nausea and vomiting)   . Spinal stenosis      Past Surgical History:  Procedure Laterality Date  . ABDOMINAL HYSTERECTOMY     total  . APPENDECTOMY    . CHOLECYSTECTOMY    . COLONOSCOPY      Social History   Tobacco Use  . Smoking status: Current Some Day Smoker    Packs/day: 0.25    Years: 34.00    Pack years: 8.50  . Smokeless tobacco: Never Used  Substance Use Topics  . Alcohol use: Yes    Comment: rarely    Family Hx: Family History  Problem Relation Age of Onset  . Depression Mother   . Cancer Father 63       pancreatic  . Healthy Sister   . Healthy Brother   . Cancer Maternal Aunt 30       breast  . Cancer Maternal Grandmother 78       breast  . Healthy Brother   . CAD Brother 17       CABG  . Healthy Brother      Medications: Current Outpatient Medications  Medication Sig Dispense Refill  . albuterol (PROVENTIL HFA;VENTOLIN HFA) 108 (90 Base) MCG/ACT inhaler Inhale 1-2 puffs into the lungs every 6 (six) hours as needed. For wheezing 1 Inhaler 0  . Aspirin-Salicylamide-Caffeine (BC HEADACHE) 325-95-16 MG TABS Take 1 packet by mouth 3 (three) times daily as needed (headache).     Marland Kitchen atorvastatin (LIPITOR) 40 MG tablet Take 1 tablet (40 mg total) by mouth at bedtime. 90 tablet 1  . B Complex-C (B-COMPLEX WITH VITAMIN C) tablet Take 1 tablet by mouth daily.    . bisacodyl (DULCOLAX) 5 MG EC tablet Take 5 mg by mouth daily as needed for moderate constipation.    . calcium carbonate (TUMS - DOSED IN MG ELEMENTAL CALCIUM) 500 MG chewable tablet  Chew 1 tablet by mouth daily as needed for indigestion or heartburn.    . Cholecalciferol (VITAMIN D3) 5000 units CAPS Take 5,000 Units by mouth daily.     . DULoxetine HCl 40 MG CPEP Take 40 mg by mouth daily. 90 capsule 1  . Fluticasone-Umeclidin-Vilant (TRELEGY ELLIPTA) 100-62.5-25 MCG/INH AEPB Inhale 1 puff into the lungs daily. Rinse mouth after use. 1 each 10  . gabapentin (NEURONTIN) 100 MG capsule Take 1 capsule (100 mg total) by mouth at bedtime.  (Patient not taking: Reported on 10/18/2018) 60 capsule 2  . gabapentin (NEURONTIN) 300 MG capsule Take 1 capsule (300 mg total) by mouth at bedtime. 30 capsule 2  . levothyroxine (SYNTHROID, LEVOTHROID) 150 MCG tablet Take 1 tablet (150 mcg total) by mouth daily. 90 tablet 1  . metaxalone (SKELAXIN) 800 MG tablet Take 800 mg by mouth 3 (three) times daily as needed for muscle spasms.     Marland Kitchen omeprazole (PRILOSEC) 20 MG capsule Take 1 capsule (20 mg total) by mouth 2 (two) times daily before a meal. 180 capsule 1  . oxyCODONE (ROXICODONE) 5 MG immediate release tablet Take 1 tablet (5 mg total) by mouth every 4 (four) hours as needed. (Patient not taking: Reported on 10/18/2018) 30 tablet 0  . oxyCODONE-acetaminophen (PERCOCET/ROXICET) 5-325 MG tablet Take 1 tablet by mouth every 6 (six) hours as needed for severe pain. (Patient not taking: Reported on 10/18/2018) 30 tablet 0  . sertraline (ZOLOFT) 100 MG tablet Take 1 tablet (100 mg total) by mouth at bedtime. 90 tablet 1  . SUMAtriptan (IMITREX) 100 MG tablet TAKE 1 TABLET BY MOUTH AS NEEDED. TAKE ALONG WITH 400MG  IBUPROFEN AT ONSET OF MIGRAINE. 10 tablet 1  . traMADol (ULTRAM) 50 MG tablet TAKE 1 TABLET BY MOUTH EVERY 6 HOURS AS NEEDED FOR PAIN. 40 tablet 0  . TRELEGY ELLIPTA 100-62.5-25 MCG/INH AEPB Take 1 puff by mouth daily.   3  . Vitamin D, Ergocalciferol, (DRISDOL) 1.25 MG (50000 UT) CAPS capsule Take one tablet wkly (Patient taking differently: Take 50,000 Units by mouth every 7 (seven) days. ) 12 capsule 1  . nicotine (NICODERM CQ - DOSED IN MG/24 HOURS) 21 mg/24hr patch Place 1 patch (21 mg total) onto the skin daily. 30 patch 1   No current facility-administered medications for this visit.     Allergies:  Allergies  Allergen Reactions  . Iodinated Diagnostic Agents Swelling and Other (See Comments)    Sneezing and eye swelling  . Other Anaphylaxis    Contrast media dye  . Codeine Rash     Review of Systems: General:   No F/C, wt  loss Pulm:   No DIB, SOB, pleuritic chest pain Card:  No CP, palpitations Abd:  No n/v/d or pain Ext:  No inc edema from baseline  Objective:  Blood pressure 140/84, pulse 76, temperature 98.3 F (36.8 C), height 5\' 6"  (1.676 m), weight 153 lb 8 oz (69.6 kg), SpO2 98 %. Body mass index is 24.78 kg/m. Gen:  ++conversational dyspnea; Well NAD, A and O *3 HEENT:    [place]/AT, EOMI,  MMM Lungs:   inc work of breathing. Very distant but ess CTA B/L, extended exhalation time Heart:   RRR, S1, S2 WNL's, no MRG Abd:   No gross distention Exts:    warm, pink,  Brisk capillary refill, warm and well perfused.  Psych:    No HI/SI, judgement and insight good, Euthymic mood. Full Affect.   Recent Results (from the past  2160 hour(s))  Lipid panel     Status: Abnormal   Collection Time: 09/03/18  9:01 AM  Result Value Ref Range   Cholesterol, Total 185 100 - 199 mg/dL   Triglycerides 91 0 - 149 mg/dL   HDL 50 >39 mg/dL   VLDL Cholesterol Cal 18 5 - 40 mg/dL   LDL Calculated 117 (H) 0 - 99 mg/dL   Chol/HDL Ratio 3.7 0.0 - 4.4 ratio    Comment:                                   T. Chol/HDL Ratio                                             Men  Women                               1/2 Avg.Risk  3.4    3.3                                   Avg.Risk  5.0    4.4                                2X Avg.Risk  9.6    7.1                                3X Avg.Risk 23.4   11.0   Specimen Status     Status: None (Preliminary result)   Collection Time: 09/03/18  9:01 AM  Result Value Ref Range   WBC WILL FOLLOW    RBC WILL FOLLOW    Hemoglobin WILL FOLLOW    Hematocrit WILL FOLLOW    MCV WILL FOLLOW    MCH WILL FOLLOW    MCHC WILL FOLLOW    RDW WILL FOLLOW    Platelets WILL FOLLOW    Neutrophils WILL FOLLOW    Lymphs WILL FOLLOW    Monocytes WILL FOLLOW    Eos WILL FOLLOW    Basos WILL FOLLOW    Neutrophils Absolute WILL FOLLOW    Lymphocytes Absolute WILL FOLLOW    Monocytes Absolute WILL FOLLOW     EOS (ABSOLUTE) WILL FOLLOW    Basophils Absolute WILL FOLLOW    Immature Granulocytes WILL FOLLOW    Immature Grans (Abs) WILL FOLLOW   CBC     Status: Abnormal   Collection Time: 10/08/18  5:16 PM  Result Value Ref Range   WBC 13.4 (H) 3.4 - 10.8 x10E3/uL   RBC 4.97 3.77 - 5.28 x10E6/uL   Hemoglobin 14.6 11.1 - 15.9 g/dL   Hematocrit 44.8 34.0 - 46.6 %   MCV 90 79 - 97 fL   MCH 29.4 26.6 - 33.0 pg   MCHC 32.6 31.5 - 35.7 g/dL   RDW 15.6 (H) 11.7 - 15.4 %   Platelets 330 150 - 450 x10E3/uL

## 2018-10-08 NOTE — Patient Instructions (Signed)
Your goal blood pressure should be 130/80 or less on a regular basis, or medications should be started/ modified.    Normal blood pressure is less than 120/80.    Hypertension Hypertension, commonly called high blood pressure, is when the force of blood pumping through the arteries is too strong. The arteries are the blood vessels that carry blood from the heart throughout the body. Hypertension forces the heart to work harder to pump blood and may cause arteries to become narrow or stiff. Having untreated or uncontrolled hypertension can cause heart attacks, strokes, kidney disease, and other problems. A blood pressure reading consists of a higher number over a lower number. Ideally, your blood pressure should be below 120/80. The first ("top") number is called the systolic pressure. It is a measure of the pressure in your arteries as your heart beats. The second ("bottom") number is called the diastolic pressure. It is a measure of the pressure in your arteries as the heart relaxes. What are the causes? The cause of this condition is not known. What increases the risk? Some risk factors for high blood pressure are under your control. Others are not. Factors you can change  Smoking.  Having type 2 diabetes mellitus, high cholesterol, or both.  Not getting enough exercise or physical activity.  Being overweight.  Having too much fat, sugar, calories, or salt (sodium) in your diet.  Drinking too much alcohol. Factors that are difficult or impossible to change  Having chronic kidney disease.  Having a family history of high blood pressure.  Age. Risk increases with age.  Race. You may be at higher risk if you are African-American.  Gender. Men are at higher risk than women before age 11. After age 31, women are at higher risk than men.  Having obstructive sleep apnea.  Stress. What are the signs or symptoms? Extremely high blood pressure (hypertensive crisis) may  cause:  Headache.  Anxiety.  Shortness of breath.  Nosebleed.  Nausea and vomiting.  Severe chest pain.  Jerky movements you cannot control (seizures).  How is this diagnosed? This condition is diagnosed by measuring your blood pressure while you are seated, with your arm resting on a surface. The cuff of the blood pressure monitor will be placed directly against the skin of your upper arm at the level of your heart. It should be measured at least twice using the same arm. Certain conditions can cause a difference in blood pressure between your right and left arms. Certain factors can cause blood pressure readings to be lower or higher than normal (elevated) for a short period of time:  When your blood pressure is higher when you are in a health care provider's office than when you are at home, this is called white coat hypertension. Most people with this condition do not need medicines.  When your blood pressure is higher at home than when you are in a health care provider's office, this is called masked hypertension. Most people with this condition may need medicines to control blood pressure.  If you have a high blood pressure reading during one visit or you have normal blood pressure with other risk factors:  You may be asked to return on a different day to have your blood pressure checked again.  You may be asked to monitor your blood pressure at home for 1 week or longer.  If you are diagnosed with hypertension, you may have other blood or imaging tests to help your health care provider understand  your overall risk for other conditions. How is this treated? This condition is treated by making healthy lifestyle changes, such as eating healthy foods, exercising more, and reducing your alcohol intake. Your health care provider may prescribe medicine if lifestyle changes are not enough to get your blood pressure under control, and if:  Your systolic blood pressure is above  130.  Your diastolic blood pressure is above 80.  Your personal target blood pressure may vary depending on your medical conditions, your age, and other factors. Follow these instructions at home: Eating and drinking  Eat a diet that is high in fiber and potassium, and low in sodium, added sugar, and fat. An example eating plan is called the DASH (Dietary Approaches to Stop Hypertension) diet. To eat this way: ? Eat plenty of fresh fruits and vegetables. Try to fill half of your plate at each meal with fruits and vegetables. ? Eat whole grains, such as whole wheat pasta, brown rice, or whole grain bread. Fill about one quarter of your plate with whole grains. ? Eat or drink low-fat dairy products, such as skim milk or low-fat yogurt. ? Avoid fatty cuts of meat, processed or cured meats, and poultry with skin. Fill about one quarter of your plate with lean proteins, such as fish, chicken without skin, beans, eggs, and tofu. ? Avoid premade and processed foods. These tend to be higher in sodium, added sugar, and fat.  Reduce your daily sodium intake. Most people with hypertension should eat less than 1,500 mg of sodium a day.  Limit alcohol intake to no more than 1 drink a day for nonpregnant women and 2 drinks a day for men. One drink equals 12 oz of beer, 5 oz of wine, or 1 oz of hard liquor. Lifestyle  Work with your health care provider to maintain a healthy body weight or to lose weight. Ask what an ideal weight is for you.  Get at least 30 minutes of exercise that causes your heart to beat faster (aerobic exercise) most days of the week. Activities may include walking, swimming, or biking.  Include exercise to strengthen your muscles (resistance exercise), such as pilates or lifting weights, as part of your weekly exercise routine. Try to do these types of exercises for 30 minutes at least 3 days a week.  Do not use any products that contain nicotine or tobacco, such as cigarettes and  e-cigarettes. If you need help quitting, ask your health care provider.  Monitor your blood pressure at home as told by your health care provider.  Keep all follow-up visits as told by your health care provider. This is important. Medicines  Take over-the-counter and prescription medicines only as told by your health care provider. Follow directions carefully. Blood pressure medicines must be taken as prescribed.  Do not skip doses of blood pressure medicine. Doing this puts you at risk for problems and can make the medicine less effective.  Ask your health care provider about side effects or reactions to medicines that you should watch for. Contact a health care provider if:  You think you are having a reaction to a medicine you are taking.  You have headaches that keep coming back (recurring).  You feel dizzy.  You have swelling in your ankles.  You have trouble with your vision. Get help right away if:  You develop a severe headache or confusion.  You have unusual weakness or numbness.  You feel faint.  You have severe pain in your chest  or abdomen.  You vomit repeatedly.  You have trouble breathing. Summary  Hypertension is when the force of blood pumping through your arteries is too strong. If this condition is not controlled, it may put you at risk for serious complications.  Your personal target blood pressure may vary depending on your medical conditions, your age, and other factors. For most people, a normal blood pressure is less than 120/80.  Hypertension is treated with lifestyle changes, medicines, or a combination of both. Lifestyle changes include weight loss, eating a healthy, low-sodium diet, exercising more, and limiting alcohol. This information is not intended to replace advice given to you by your health care provider. Make sure you discuss any questions you have with your health care provider. Document Released: 02/13/2005 Document Revised: 01/12/2016  Document Reviewed: 01/12/2016 Elsevier Interactive Patient Education  2018 [employer].    How to Take Your Blood Pressure   Blood pressure is a measurement of how strongly your blood is pressing against the walls of your arteries. Arteries are blood vessels that carry blood from your heart throughout your body. Your health care provider takes your blood pressure at each office visit. You can also take your own blood pressure at home with a blood pressure machine. You may need to take your own blood pressure:  To confirm a diagnosis of high blood pressure (hypertension).  To monitor your blood pressure over time.  To make sure your blood pressure medicine is working.  Supplies needed: To take your blood pressure, you will need a blood pressure machine. You can buy a blood pressure machine, or blood pressure monitor, at most drugstores or online. There are several types of home blood pressure monitors. When choosing one, consider the following:  Choose a monitor that has an arm cuff.  Choose a monitor that wraps snugly around your upper arm. You should be able to fit only one finger between your arm and the cuff.  Do not choose a monitor that measures your blood pressure from your wrist or finger.  Your health care provider can suggest a reliable monitor that will meet your needs. How to prepare To get the most accurate reading, avoid the following for 30 minutes before you check your blood pressure:  Drinking caffeine.  Drinking alcohol.  Eating.  Smoking.  Exercising.  Five minutes before you check your blood pressure:  Empty your bladder.  Sit quietly without talking in a dining chair, rather than in a soft couch or armchair.  How to take your blood pressure To check your blood pressure, follow the instructions in the manual that came with your blood pressure monitor. If you have a digital blood pressure monitor, the instructions may be as follows: 1. Sit up  straight. 2. Place your feet on the floor. Do not cross your ankles or legs. 3. Rest your left arm at the level of your heart on a table or desk or on the arm of a chair. 4. Pull up your shirt sleeve. 5. Wrap the blood pressure cuff around the upper part of your left arm, 1 inch (2.5 cm) above your elbow. It is best to wrap the cuff around bare skin. 6. Fit the cuff snugly around your arm. You should be able to place only one finger between the cuff and your arm. 7. Position the cord inside the groove of your elbow. 8. Press the power button. 9. Sit quietly while the cuff inflates and deflates. 10. Read the digital reading on the monitor  screen and write it down (record it). 11. Wait 2-3 minutes, then repeat the steps, starting at step 1.  What does my blood pressure reading mean? A blood pressure reading consists of a higher number over a lower number. Ideally, your blood pressure should be below 120/80. The first ("top") number is called the systolic pressure. It is a measure of the pressure in your arteries as your heart beats. The second ("bottom") number is called the diastolic pressure. It is a measure of the pressure in your arteries as the heart relaxes. Blood pressure is classified into four stages. The following are the stages for adults who do not have a short-term serious illness or a chronic condition. Systolic pressure and diastolic pressure are measured in a unit called mm Hg. Normal  Systolic pressure: below 831.  Diastolic pressure: below 80. Elevated  Systolic pressure: 517-616.  Diastolic pressure: below 80. Hypertension stage 1  Systolic pressure: 073-710.  Diastolic pressure: 62-69. Hypertension stage 2  Systolic pressure: 485 or above.  Diastolic pressure: 90 or above. You can have prehypertension or hypertension even if only the systolic or only the diastolic number in your reading is higher than normal. Follow these instructions at home:  Check your blood  pressure as often as recommended by your health care provider.  Take your monitor to the next appointment with your health care provider to make sure: ? That you are using it correctly. ? That it provides accurate readings.  Be sure you understand what your goal blood pressure numbers are.  Tell your health care provider if you are having any side effects from blood pressure medicine. Contact a health care provider if:  Your blood pressure is consistently high. Get help right away if:  Your systolic blood pressure is higher than 180.  Your diastolic blood pressure is higher than 110. This information is not intended to replace advice given to you by your health care provider. Make sure you discuss any questions you have with your health care provider. Document Released: 07/23/2015 Document Revised: 10/05/2015 Document Reviewed: 07/23/2015 Elsevier Interactive Patient Education  2018 [employer].      Hypertension, Adult  High blood pressure (hypertension) is when the force of blood pumping through the arteries is too strong. The arteries are the blood vessels that carry blood from the heart throughout the body. Hypertension forces the heart to work harder to pump blood and may cause arteries to become narrow or stiff. Untreated or uncontrolled hypertension can cause a heart attack, heart failure, a stroke, kidney disease, and other problems. A blood pressure reading consists of a higher number over a lower number. Ideally, your blood pressure should be below 120/80. The first ("top") number is called the systolic pressure. It is a measure of the pressure in your arteries as your heart beats. The second ("bottom") number is called the diastolic pressure. It is a measure of the pressure in your arteries as the heart relaxes. What are the causes? The exact cause of this condition is not known. There are some conditions that result in or are related to high blood pressure. What increases  the risk? Some risk factors for high blood pressure are under your control. The following factors may make you more likely to develop this condition:  Smoking.  Having type 2 diabetes mellitus, high cholesterol, or both.  Not getting enough exercise or physical activity.  Being overweight.  Having too much fat, sugar, calories, or salt (sodium) in your diet.  Drinking  too much alcohol. Some risk factors for high blood pressure may be difficult or impossible to change. Some of these factors include:  Having chronic kidney disease.  Having a family history of high blood pressure.  Age. Risk increases with age.  Race. You may be at higher risk if you are African American.  Gender. Men are at higher risk than women before age 37. After age 23, women are at higher risk than men.  Having obstructive sleep apnea.  Stress. What are the signs or symptoms? High blood pressure may not cause symptoms. Very high blood pressure (hypertensive crisis) may cause:  Headache.  Anxiety.  Shortness of breath.  Nosebleed.  Nausea and vomiting.  Vision changes.  Severe chest pain.  Seizures. How is this diagnosed? This condition is diagnosed by measuring your blood pressure while you are seated, with your arm resting on a flat surface, your legs uncrossed, and your feet flat on the floor. The cuff of the blood pressure monitor will be placed directly against the skin of your upper arm at the level of your heart. It should be measured at least twice using the same arm. Certain conditions can cause a difference in blood pressure between your right and left arms. Certain factors can cause blood pressure readings to be lower or higher than normal for a short period of time:  When your blood pressure is higher when you are in a health care provider's office than when you are at home, this is called white coat hypertension. Most people with this condition do not need medicines.  When your  blood pressure is higher at home than when you are in a health care provider's office, this is called masked hypertension. Most people with this condition may need medicines to control blood pressure. If you have a high blood pressure reading during one visit or you have normal blood pressure with other risk factors, you may be asked to:  Return on a different day to have your blood pressure checked again.  Monitor your blood pressure at home for 1 week or longer. If you are diagnosed with hypertension, you may have other blood or imaging tests to help your health care provider understand your overall risk for other conditions. How is this treated? This condition is treated by making healthy lifestyle changes, such as eating healthy foods, exercising more, and reducing your alcohol intake. Your health care provider may prescribe medicine if lifestyle changes are not enough to get your blood pressure under control, and if:  Your systolic blood pressure is above 130.  Your diastolic blood pressure is above 80. Your personal target blood pressure may vary depending on your medical conditions, your age, and other factors. Follow these instructions at home: Eating and drinking   Eat a diet that is high in fiber and potassium, and low in sodium, added sugar, and fat. An example eating plan is called the DASH (Dietary Approaches to Stop Hypertension) diet. To eat this way: ? Eat plenty of fresh fruits and vegetables. Try to fill one half of your plate at each meal with fruits and vegetables. ? Eat whole grains, such as whole-wheat pasta, brown rice, or whole-grain bread. Fill about one fourth of your plate with whole grains. ? Eat or drink low-fat dairy products, such as skim milk or low-fat yogurt. ? Avoid fatty cuts of meat, processed or cured meats, and poultry with skin. Fill about one fourth of your plate with lean proteins, such as fish, chicken without  skin, beans, eggs, or tofu. ? Avoid  pre-made and processed foods. These tend to be higher in sodium, added sugar, and fat.  Reduce your daily sodium intake. Most people with hypertension should eat less than 1,500 mg of sodium a day.  Do not drink alcohol if: ? Your health care provider tells you not to drink. ? You are pregnant, may be pregnant, or are planning to become pregnant.  If you drink alcohol: ? Limit how much you use to:  0-1 drink a day for women.  0-2 drinks a day for men. ? Be aware of how much alcohol is in your drink. In the [place] one drink equals one 12 oz bottle of beer (355 mL), one 5 oz glass of wine (148 mL), or one 1 oz glass of hard liquor (44 mL). Lifestyle   Work with your health care provider to maintain a healthy body weight or to lose weight. Ask what an ideal weight is for you.  Get at least 30 minutes of exercise most days of the week. Activities may include walking, swimming, or biking.  Include exercise to strengthen your muscles (resistance exercise), such as Pilates or lifting weights, as part of your weekly exercise routine. Try to do these types of exercises for 30 minutes at least 3 days a week.  Do not use any products that contain nicotine or tobacco, such as cigarettes, e-cigarettes, and chewing tobacco. If you need help quitting, ask your health care provider.  Monitor your blood pressure at home as told by your health care provider.  Keep all follow-up visits as told by your health care provider. This is important. Medicines  Take over-the-counter and prescription medicines only as told by your health care provider. Follow directions carefully. Blood pressure medicines must be taken as prescribed.  Do not skip doses of blood pressure medicine. Doing this puts you at risk for problems and can make the medicine less effective.  Ask your health care provider about side effects or reactions to medicines that you should watch for. Contact a health care provider if you:  Think  you are having a reaction to a medicine you are taking.  Have headaches that keep coming back (recurring).  Feel dizzy.  Have swelling in your ankles.  Have trouble with your vision. Get help right away if you:  Develop a severe headache or confusion.  Have unusual weakness or numbness.  Feel faint.  Have severe pain in your chest or abdomen.  Vomit repeatedly.  Have trouble breathing. Summary  Hypertension is when the force of blood pumping through your arteries is too strong. If this condition is not controlled, it may put you at risk for serious complications.  Your personal target blood pressure may vary depending on your medical conditions, your age, and other factors. For most people, a normal blood pressure is less than 120/80.  Hypertension is treated with lifestyle changes, medicines, or a combination of both. Lifestyle changes include losing weight, eating a healthy, low-sodium diet, exercising more, and limiting alcohol. This information is not intended to replace advice given to you by your health care provider. Make sure you discuss any questions you have with your health care provider. Document Released: 02/13/2005 Document Revised: 10/24/2017 Document Reviewed: 10/24/2017 Elsevier Patient Education  2020 [employer].

## 2018-10-09 LAB — CBC
Hematocrit: 44.8 % (ref 34.0–46.6)
Hemoglobin: 14.6 g/dL (ref 11.1–15.9)
MCH: 29.4 pg (ref 26.6–33.0)
MCHC: 32.6 g/dL (ref 31.5–35.7)
MCV: 90 fL (ref 79–97)
Platelets: 330 10*3/uL (ref 150–450)
RBC: 4.97 x10E6/uL (ref 3.77–5.28)
RDW: 15.6 % — ABNORMAL HIGH (ref 11.7–15.4)
WBC: 13.4 10*3/uL — ABNORMAL HIGH (ref 3.4–10.8)

## 2018-10-10 ENCOUNTER — Encounter: Payer: Self-pay | Admitting: Family Medicine

## 2018-10-14 ENCOUNTER — Encounter: Payer: Self-pay | Admitting: Family Medicine

## 2018-10-15 ENCOUNTER — Telehealth: Payer: Self-pay

## 2018-10-15 ENCOUNTER — Other Ambulatory Visit: Payer: Self-pay | Admitting: Internal Medicine

## 2018-10-15 ENCOUNTER — Encounter: Payer: Self-pay | Admitting: Family Medicine

## 2018-10-15 MED ORDER — NICOTINE 21 MG/24HR TD PT24: 21.0000 mg | MEDICATED_PATCH | TRANSDERMAL | 1 refills | Status: DC

## 2018-10-15 NOTE — Telephone Encounter (Signed)
Received MyChart message from pt that she obtained RX for Chantix from pulmonologist.  Charyl Bigger, CMA

## 2018-10-15 NOTE — Telephone Encounter (Signed)
DR please advise.  

## 2018-10-18 ENCOUNTER — Encounter: Payer: Self-pay | Admitting: Orthopaedic Surgery

## 2018-10-18 ENCOUNTER — Ambulatory Visit (INDEPENDENT_AMBULATORY_CARE_PROVIDER_SITE_OTHER): Payer: Worker's Compensation | Admitting: Orthopaedic Surgery

## 2018-10-18 VITALS — BP 113/82 | HR 98 | Ht 66.0 in | Wt 153.0 lb

## 2018-10-18 DIAGNOSIS — Z981 Arthrodesis status: Secondary | ICD-10-CM | POA: Diagnosis not present

## 2018-10-18 NOTE — Progress Notes (Signed)
Office Visit Note   Patient: Kellie Jones           Date of Birth: Apr 25, 1960           MRN: [id number] Visit Date: 10/18/2018              Requested by: Mellody Dance, DO [place],  [place] [address] PCP: Mellody Dance, DO   Assessment & Plan: Visit Diagnoses:  1. Status post lumbar spinal fusion     Plan: Patient returns and is having persistent problems with back pain post L4-5 instrumented fusion on 05/03/2018 related to [employer] injury.  She had shifting on flexion-extension at the L4-5 level with broad-based disc protrusion noted on myelogram CT scan done 04/10/2017.  There was a delay in proceeding with surgical intervention and her surgery was done on 05/03/2018.  She has been through some attempts at therapy which were not able to progress due to [place].  She has been using her brothers pool trying to do some exercising.  She states she continues to have pain in her back and in her right leg and has not been able to progress.  She states her pain is severe enough she does not feel like she is able to do the job she previously did.  I would recommend proceeding with MRI scan lumbar with and without contrast make sure there is no residual areas of compression and also obtain a functional capacity evaluation determine her abilities.  Work slip given no work pending completion of these 2 items.  Office follow-up after completion.  Follow-Up Instructions: No follow-ups on file.   Orders:  Orders Placed This Encounter  Procedures  . MR LUMBAR SPINE W WO CONTRAST  . Ambulatory referral to Physical Therapy   No orders of the defined types were placed in this encounter.     Procedures: No procedures performed   Clinical Data: No additional findings.   Subjective: Chief Complaint  Patient presents with  . Lower Back - Pain, Follow-up    05/03/2018 L4-5 TLIF, Pedicle Instrumentation, Cage  . Right Hip - Pain, Follow-up  . Left Hip - Pain, Follow-up     HPI 58 year old female returns post L4-5T lift pedicle instrumentation cage on 05/03/2018 for broad-based disc bulge with anterolisthesis on flexion-extension x-rays as well as myelogram CT scan.  Postoperatively therapy was delayed due to coronavirus.  She has been using her brothers pool some.  She did have a fall on 08/13/2018 she has been slow with ambulation states she continues to have pressure on the right side in her buttocks and into her right leg.  She is not been reimaged since her surgery.  She is been on anti-inflammatories muscle relaxants she is currently not on any narcotic medication.  Review of Systems 14 point systems update unchanged.  She does have a nicotine patch on and has not been smoking for the last 2weeks.  Otherwise unchanged since obtains HPI.   Objective: Vital Signs: BP 113/82   Pulse 98   Ht 5\' 6"  (1.676 m)   Wt 153 lb (69.4 kg)   BMI 24.69 kg/m   Physical Exam Constitutional:      Appearance: She is well-developed.  HENT:     Head: Normocephalic.     Right Ear: External ear normal.     Left Ear: External ear normal.  Eyes:     Pupils: Pupils are equal, round, and reactive to light.  Neck:     Thyroid:  No thyromegaly.     Trachea: No tracheal deviation.  Cardiovascular:     Rate and Rhythm: Normal rate.  Pulmonary:     Effort: Pulmonary effort is normal.  Abdominal:     Palpations: Abdomen is soft.  Skin:    General: Skin is warm and dry.  Neurological:     Mental Status: She is alert and oriented to person, place, and time.  Psychiatric:        Behavior: Behavior normal.     Ortho Exam negative logroll of the hip she has some discomfort with straight leg raising at 90 degrees.  Knee jerk is 2+.  Ankle jerk on the left is 1+ ankle jerk on the right is trace.  Anterior tib EHL is active and strong.  No calf atrophy.  No rash over exposed skin.  Distal pulses are intact.  Specialty Comments:  No specialty comments available.  Imaging:  Last x-rays July 2020 showed good position 4 months post fusion L4-5 with good position of cages screws and bone graft without evidence of loosening or cage migration.   PMFS History: Patient Active Problem List   Diagnosis Date Noted  . Hyperlipidemia 10/08/2018  . Status post lumbar spinal fusion 06/11/2018  . Lumbar stenosis 05/03/2018  . Preop cardiovascular exam 04/22/2018  . Dyslipidemia 04/22/2018  . 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 Shortness of breath on exertion 04/08/2018  . Radiculopathy, lumbar region 03/06/2018  . Chronic back pain greater than 3 months duration 09/24/2017  . h/o Excessive drinking alcohol 09/24/2017  . Mammogram declined despite multiple attempts to get patient to go especially since her daughter with premature breast cancer 09/24/2017  . 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 Family history of breast cancer in first degree relative---> daughter- at age 84, 31nd daughter 57 08/17/2016  . Hyperalgesia- skin of feet.  07/17/2016  . Fibrocystic breast changes 07/17/2016  . GERD (gastroesophageal reflux disease) 01/19/2016  . Vitamin D deficiency 01/03/2016  . Tobacco use disorder: >30pk yr hx 01/03/2016  . Tobacco abuse counseling 01/03/2016  . Erythrocytosis due to hypoxemia 01/03/2016  . History of gastroesophageal reflux (GERD) 01/03/2016  . Environmental and seasonal allergies 01/03/2016  . Bilateral feet cramps- worse at night 12/11/2015  . Migraine 12/07/2015  . Hypothyroidism 12/07/2015  . COPD still smoking  12/07/2015  . Anxiety 12/07/2015  . Depression 12/07/2015  . Other fatigue 12/07/2015  . Absolute anemia 12/07/2015   Past Medical History:  Diagnosis Date  . Anxiety   . Arthritis   . Asthma due to seasonal allergies   . Constipation   . COPD (chronic obstructive pulmonary disease) ([facility])   . Depression   . Dyslipidemia   . Dyspnea   . History of blood transfusion   . History of  kidney stones    several passed  . Hypothyroidism   . Migraine    Migraines  . Neuropathic pain   . Pneumonia    frequent - last time 2015 ish  . PONV (postoperative nausea and vomiting)   . Spinal stenosis     Family History  Problem Relation Age of Onset  . Depression Mother   . Cancer Father 13       pancreatic  . Healthy Sister   . Healthy Brother   . Cancer Maternal Aunt 82       breast  . Cancer Maternal Grandmother 48       breast  . Healthy  Brother   . CAD Brother 66       CABG  . Healthy Brother     Past Surgical History:  Procedure Laterality Date  . ABDOMINAL HYSTERECTOMY     total  . APPENDECTOMY    . CHOLECYSTECTOMY    . COLONOSCOPY     Social History   Occupational History  . Not on file  Tobacco Use  . Smoking status: Current Some Day Smoker    Packs/day: 0.25    Years: 34.00    Pack years: 8.50  . Smokeless tobacco: Never Used  Substance and Sexual Activity  . Alcohol use: Yes    Comment: rarely  . Drug use: No  . Sexual activity: Never

## 2018-10-30 NOTE — Telephone Encounter (Signed)
DR please advise. Thanks 

## 2018-10-31 NOTE — Telephone Encounter (Signed)
Please see pt's response regarding rash. Thanks

## 2018-10-31 NOTE — Telephone Encounter (Signed)
  I would still like to have something for smoking sensation   DR please advise. Thanks

## 2018-11-01 ENCOUNTER — Other Ambulatory Visit: Payer: Self-pay | Admitting: Internal Medicine

## 2018-11-01 MED ORDER — CHANTIX STARTING MONTH PAK 0.5 MG X 11 & 1 MG X 42 PO TABS: ORAL_TABLET | ORAL | 0 refills | Status: DC

## 2018-11-12 ENCOUNTER — Other Ambulatory Visit: Payer: Self-pay

## 2018-11-12 NOTE — Telephone Encounter (Signed)
Opened in error. T. Doretha Goding, CMA 

## 2018-11-13 ENCOUNTER — Telehealth: Payer: Self-pay | Admitting: Orthopaedic Surgery

## 2018-11-13 NOTE — Telephone Encounter (Signed)
Can you advise on lab work needing to be done?

## 2018-11-13 NOTE — Telephone Encounter (Signed)
Remo Lipps with [facility] left a voicemail message in regards to the patient's MRI.  He stated that they had to cancel her MRI due to them not having someone to scan her late in the afternoon and also the patient needs to have a UA creatine because of the contrast.  Reference [id number].  CB#[phone].  Thank you.

## 2018-11-13 NOTE — Telephone Encounter (Signed)
Yes  OK , UA and creatinine. thanks

## 2018-11-14 ENCOUNTER — Encounter: Payer: Self-pay | Admitting: Orthopaedic Surgery

## 2018-11-14 NOTE — Telephone Encounter (Signed)
I have put rx for this in your box. Patient is W/C. Thanks.

## 2018-11-15 NOTE — Telephone Encounter (Signed)
Not necessarily. We do not draw these when we put in an order for MRI with contrast. If the facility feels like patient needs it after they go through their questions, they may ask for it to be done, but we do not bring patient back in routinely to draw it.  They also asked for a Urinalysis per message. I have no idea what that is for or why the facility is requesting it. I thought maybe they would have reached out to RN Case Manager.

## 2018-11-15 NOTE — Telephone Encounter (Signed)
Faxed order for lab work to [facility] fax#[phone] and the [facility] @ [place]

## 2018-11-19 ENCOUNTER — Other Ambulatory Visit: Payer: Self-pay | Admitting: *Deleted

## 2018-11-19 DIAGNOSIS — Z981 Arthrodesis status: Secondary | ICD-10-CM

## 2018-11-20 ENCOUNTER — Encounter: Payer: Self-pay | Admitting: Family Medicine

## 2018-11-20 ENCOUNTER — Telehealth: Payer: Self-pay

## 2018-11-20 NOTE — Telephone Encounter (Signed)
Please call pt to schedule appt per her request thru [facility].  Charyl Bigger, CMA

## 2018-11-22 ENCOUNTER — Other Ambulatory Visit: Payer: Self-pay

## 2018-11-22 ENCOUNTER — Other Ambulatory Visit: Payer: Managed Care, Other (non HMO)

## 2018-11-22 DIAGNOSIS — Z981 Arthrodesis status: Secondary | ICD-10-CM

## 2018-11-26 LAB — CREATINE: Creatine, Serum: 0.5 mg/dL (ref 0.1–1.0)

## 2018-11-27 ENCOUNTER — Other Ambulatory Visit: Payer: Self-pay

## 2018-11-27 ENCOUNTER — Encounter: Payer: Self-pay | Admitting: Orthopaedic Surgery

## 2018-11-27 ENCOUNTER — Ambulatory Visit: Payer: Managed Care, Other (non HMO) | Admitting: Internal Medicine

## 2018-11-27 NOTE — Telephone Encounter (Signed)
Opened in error. T. Nelson, CMA 

## 2018-11-28 ENCOUNTER — Encounter: Payer: Self-pay | Admitting: Pulmonary Disease

## 2018-11-28 ENCOUNTER — Telehealth: Payer: Self-pay | Admitting: Orthopaedic Surgery

## 2018-11-28 ENCOUNTER — Ambulatory Visit (INDEPENDENT_AMBULATORY_CARE_PROVIDER_SITE_OTHER): Payer: Managed Care, Other (non HMO) | Admitting: Pulmonary Disease

## 2018-11-28 ENCOUNTER — Other Ambulatory Visit: Payer: Self-pay

## 2018-11-28 VITALS — BP 136/82 | HR 94 | Temp 98.0°F | Ht 66.0 in | Wt 153.2 lb

## 2018-11-28 DIAGNOSIS — J449 Chronic obstructive pulmonary disease, unspecified: Secondary | ICD-10-CM

## 2018-11-28 DIAGNOSIS — F17201 Nicotine dependence, unspecified, in remission: Secondary | ICD-10-CM

## 2018-11-28 DIAGNOSIS — Z23 Encounter for immunization: Secondary | ICD-10-CM | POA: Diagnosis not present

## 2018-11-28 DIAGNOSIS — J4489 Other specified chronic obstructive pulmonary disease: Secondary | ICD-10-CM

## 2018-11-28 NOTE — Progress Notes (Signed)
Subjective:    Patient ID: Kellie Jones, female    DOB: Jul 24, 1960, 58 y.o.   MRN: [id number]  HPI Patient is a 58 year old current smoker (1/4 pack a day), who presents for follow-up on COPD.  She was previously followed by Dr. Ashby Dawes, however, I am assuming care as Dr. Ashby Dawes has left the practice.  The patient last saw Dr. Ashby Dawes on 05 September 2018.  The patient has been classified as COPD/emphysema group D with recurrent exacerbations.  The patient previously followed with Dr. Alcide Clever in [place], prior to that she had seen Dr. Christinia Gully at [facility] in [place] in 2017. Patient's last pulmonary function testing was performed in [place] at [facility], these were done on 11 December 2017 and showed an FEV1 of 0.8 L or 40% predicted, FVC of 2.0 L or 60% of predicted with an FEV1/FVC of 58%.  She had no bronchodilator response.  She had severe air trapping with residual volume of 353% and diffusion capacity was severely decreased at 19%. The patient started noticing shortness of breath around the fall 2016 and has had a continuous decline in function since then.  She notes significant dyspnea on exertion, dry cough and wheezing. She has not had any change in the quality of her cough or sputum.  This is mostly in the mornings. Sputum is reported gray in color.  No hemoptysis.  No chest pain.  She does not describe any orthopnea nor paroxysmal nocturnal dyspnea.  She feels that the Trelegy sometimes does not reach deep in her lungs.  She is not quite ready to switch to nebulizers completely.  She voices no other complaint today.  She has decreased tobacco use to 1 pack/week but for the most part has stopped smoking Wednesday a week ago.  He did smoke 3 packs of cigarettes per day for 35 years prior to decreasing her smoking.  She used to be employed as a Furniture conservator/restorer.  In March 2020 she underwent back surgery and was on a ventilator for 1-1/2 days post surgery.  She did  require oxygen transiently after that but has been weaned off of it.  Review of Systems A 10 point review of systems was performed and it is as noted above otherwise negative.    Objective:   Physical Exam BP 136/82 (BP Location: Left Arm, Cuff Size: Normal)   Pulse 94   Temp 98 F (36.7 C) (Temporal)   Ht 5\' 6"  (1.676 m)   Wt 153 lb 3.2 oz (69.5 kg)   SpO2 95%   BMI 24.73 kg/m  GENERAL: Thin well-developed woman in no respiratory distress, pursed lip breathing noted. HEAD: Normocephalic, atraumatic.  EYES: Pupils equal, round, reactive to light.  No scleral icterus.  MOUTH: Nose/mouth/throat not examined due to masking requirements for COVID 19. NECK: Supple. No thyromegaly. No nodules. No JVD.  PULMONARY: Lungs with distant breath sounds bilaterally no wheezes or rhonchi noted.  Coarse breath sounds. CARDIOVASCULAR: S1 and S2. Regular rate and rhythm.  No murmurs appreciated GASTROINTESTINAL: Scaphoid abdomen, soft, nondistended MUSCULOSKELETAL: No joint swelling, no clubbing, no edema.  NEUROLOGIC: Awake alert, no gross focal deficits. SKIN: Intact,warm,dry.  Limited exam no rashes PSYCH: Mood and behavior are appropriate.      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  Renold Don, MD [facility]  *This note was dictated using voice recognition software/Dragon.  Despite best efforts to proofread, errors can occur which can change the  meaning.  Any change was purely unintentional.

## 2018-11-28 NOTE — Telephone Encounter (Signed)
I tried to call case manager with no answer.  I got results on labs ordered and it is on Creatine, not Creatinine.  I have left voicemail for patient to call me to see about drawing labs here in the office.

## 2018-11-28 NOTE — Telephone Encounter (Signed)
Received vm from [place] case mger w/ [facility]. Stated pt went for labs prior to MRI and needing someone to call for the lab results as patient states she was told we do not them. Call [facility] for result [phone]. Margarita Sermons callback is [phone]

## 2018-11-28 NOTE — Patient Instructions (Signed)
1.  Continue Trelegy Ellipta.  2.  Use albuterol at least 2 hours before Trelegy to see if this helps getting the medication and deeper  3.  You will get your flu shot today  4.  We will check alpha 1 antitrypsin with a cheek swab  5.  We will see her in follow-up in 3 months time

## 2018-11-29 ENCOUNTER — Telehealth: Payer: Self-pay

## 2018-11-29 ENCOUNTER — Ambulatory Visit (INDEPENDENT_AMBULATORY_CARE_PROVIDER_SITE_OTHER): Payer: Managed Care, Other (non HMO)

## 2018-11-29 DIAGNOSIS — Z981 Arthrodesis status: Secondary | ICD-10-CM

## 2018-11-29 NOTE — Telephone Encounter (Signed)
New message in chart from Amy B. Will send lab results to her as soon as they are available to get patient scheduled for MRI.

## 2018-11-29 NOTE — Telephone Encounter (Signed)
Case mgr called to check status on the lab work for this patient. I see where she came into day for it. When we get the results back will you let me know because they will need to be sent to the MRI facility so they can get her rescheduled. Thanks

## 2018-11-29 NOTE — Telephone Encounter (Signed)
Patient came in to get blood work for MRI.  BUN, Creatinine and GFR was drawn. Patient had no complications and 1 serum and 1 lavender was drawn successful.

## 2018-11-30 LAB — BASIC METABOLIC PANEL WITH GFR
BUN: 10 mg/dL (ref 7–25)
CO2: 27 mmol/L (ref 20–32)
Calcium: 9.7 mg/dL (ref 8.6–10.4)
Chloride: 106 mmol/L (ref 98–110)
Creat: 0.81 mg/dL (ref 0.50–1.05)
GFR, Est African American: 93 mL/min/{1.73_m2} (ref >=60)
GFR, Est Non African American: 81 mL/min/{1.73_m2} (ref >=60)
Glucose, Bld: 85 mg/dL (ref 65–99)
Potassium: 4.8 mmol/L (ref 3.5–5.3)
Sodium: 143 mmol/L (ref 135–146)

## 2018-11-30 LAB — EXTRA LAV TOP TUBE

## 2018-12-02 ENCOUNTER — Other Ambulatory Visit: Payer: Self-pay | Admitting: Family Medicine

## 2018-12-02 DIAGNOSIS — E039 Hypothyroidism, unspecified: Secondary | ICD-10-CM

## 2018-12-02 DIAGNOSIS — F419 Anxiety disorder, unspecified: Secondary | ICD-10-CM

## 2018-12-02 DIAGNOSIS — F321 Major depressive disorder, single episode, moderate: Secondary | ICD-10-CM

## 2018-12-02 NOTE — Telephone Encounter (Signed)
Labs are now in the chart. Thanks.

## 2018-12-02 NOTE — Telephone Encounter (Signed)
Faxed lab work to [facility]

## 2018-12-06 ENCOUNTER — Telehealth: Payer: Self-pay | Admitting: *Deleted

## 2018-12-06 NOTE — Telephone Encounter (Signed)
Faxed lab results to [employer] to [employer] as requested by [facility] to [phone]

## 2018-12-30 ENCOUNTER — Other Ambulatory Visit: Payer: Self-pay | Admitting: Family Medicine

## 2018-12-30 DIAGNOSIS — G8929 Other chronic pain: Secondary | ICD-10-CM

## 2018-12-30 DIAGNOSIS — M549 Dorsalgia, unspecified: Secondary | ICD-10-CM

## 2018-12-30 DIAGNOSIS — F321 Major depressive disorder, single episode, moderate: Secondary | ICD-10-CM

## 2019-01-15 ENCOUNTER — Encounter: Payer: Self-pay | Admitting: Orthopaedic Surgery

## 2019-01-15 ENCOUNTER — Other Ambulatory Visit: Payer: Self-pay

## 2019-01-15 ENCOUNTER — Ambulatory Visit (INDEPENDENT_AMBULATORY_CARE_PROVIDER_SITE_OTHER): Payer: Worker's Compensation | Admitting: Orthopaedic Surgery

## 2019-01-15 VITALS — BP 123/73 | HR 96 | Ht 66.0 in | Wt 153.0 lb

## 2019-01-15 DIAGNOSIS — Z981 Arthrodesis status: Secondary | ICD-10-CM

## 2019-01-15 NOTE — Progress Notes (Signed)
Office Visit Note   Patient: Kellie Jones           Date of Birth: May 03, 1960           MRN: [id number] Visit Date: 01/15/2019              Requested by: Mellody Dance, DO [place],  [place] [address] PCP: Mellody Dance, DO   Assessment & Plan: Visit Diagnoses:  1. Status post lumbar spinal fusion     Plan: We will order an FCE.  Post FCE review patient likely will be released for work activities and rated in line with work restrictions in line with the FCE findings.  MRI images were reviewed with patient.  We will give her a copy report once we obtain it.  Follow-Up Instructions: Return return after FCE.   Orders:  No orders of the defined types were placed in this encounter.  No orders of the defined types were placed in this encounter.     Procedures: No procedures performed   Clinical Data: No additional findings.   Subjective: Chief Complaint  Patient presents with   Lower Back - Pain, Follow-up    MRI Lumbar Review    HPI 58 year old female returns with new MRI scan post single level L4-5 instrumented fusion done 05/03/2018.  She is not been back to work in about 2years since original injury.  MRI scan was done at [facility] in [facility].  Patient states she still has trouble ambulating and not able to walk a block.  She has pain with turning and twisting.  Patient has not had an FCE.  Review of Systems 14 point update unchanged from 10/18/2018 other than as mentioned in HPI.   Objective: Vital Signs: BP 123/73    Pulse 96    Ht 5\' 6"  (1.676 m)    Wt 153 lb (69.4 kg)    BMI 24.69 kg/m   Physical Exam Constitutional:      Appearance: She is well-developed.  HENT:     Head: Normocephalic.     Right Ear: External ear normal.     Left Ear: External ear normal.  Eyes:     Pupils: Pupils are equal, round, and reactive to light.  Neck:     Thyroid: No thyromegaly.     Trachea: No tracheal deviation.  Cardiovascular:   Rate and Rhythm: Normal rate.  Pulmonary:     Effort: Pulmonary effort is normal.  Abdominal:     Palpations: Abdomen is soft.  Skin:    General: Skin is warm and dry.  Neurological:     Mental Status: She is alert and oriented to person, place, and time.  Psychiatric:        Behavior: Behavior normal.     Ortho Exam negative logroll to the hips.  Reflexes are intact.  No atrophy.  Lumbar skin is well-healed.  Specialty Comments:  No specialty comments available.  Imaging: MRI report is pending by fax with [facility].  I reviewed the images there is no residual compression at the L4-5 level.  Mild increased facet fluid noted at L2-3 and also L3-4 level.  No areas of central lateral recess or foraminal compression.  Radiologist interpretation pending.   Radiologist report read by Dr. Lynnell Jude states postsurgical changes at L4-5 disc with hardware placement.  Mild anterolisthesis with shallow noncompressive pseudodisc displacement or disc bulge. L3-L4 shallow noncompressive disc displacement or disc bulge.  PMFS History: Patient Active Problem List  Diagnosis Date Noted   Hyperlipidemia 10/08/2018   Status post lumbar spinal fusion 06/11/2018   Lumbar stenosis 05/03/2018   Preop cardiovascular exam 04/22/2018   Dyslipidemia 04/22/2018   Elevated lipoprotein(a) 04/08/2018   Shortness of breath on exertion 04/08/2018   Radiculopathy, lumbar region 03/06/2018   Chronic back pain greater than 3 months duration 09/24/2017   h/o Excessive drinking alcohol 09/24/2017   Mammogram declined despite multiple attempts to get patient to go especially since her daughter with premature breast cancer 09/24/2017   Chronic midline low back pain with right-sided sciatica- s/p injury  12/18/2016   Family history of breast cancer in first degree relative---> daughter- at age 60, 2nd daughter 25 08/17/2016   Hyperalgesia- skin of feet.  07/17/2016   Fibrocystic breast changes  07/17/2016   GERD (gastroesophageal reflux disease) 01/19/2016   Vitamin D deficiency 01/03/2016   Tobacco use disorder: >30pk yr hx 01/03/2016   Tobacco abuse counseling 01/03/2016   Erythrocytosis due to hypoxemia 01/03/2016   History of gastroesophageal reflux (GERD) 01/03/2016   Environmental and seasonal allergies 01/03/2016   Bilateral feet cramps- worse at night 12/11/2015   Migraine 12/07/2015   Hypothyroidism 12/07/2015   COPD still smoking  12/07/2015   Anxiety 12/07/2015   Depression 12/07/2015   Other fatigue 12/07/2015   Absolute anemia 12/07/2015   Past Medical History:  Diagnosis Date   Anxiety    Arthritis    Asthma due to seasonal allergies    Constipation    COPD (chronic obstructive pulmonary disease) ([facility])    Depression    Dyslipidemia    Dyspnea    History of blood transfusion    History of kidney stones    several passed   Hypothyroidism    Migraine    Migraines   Neuropathic pain    Pneumonia    frequent - last time 2015 ish   PONV (postoperative nausea and vomiting)    Spinal stenosis     Family History  Problem Relation Age of Onset   Depression Mother    Cancer Father 70       pancreatic   Healthy Sister    Healthy Brother    Cancer Maternal Aunt 58       breast   Cancer Maternal Grandmother 80       breast   Healthy Brother    CAD Brother 65       CABG   Healthy Brother     Past Surgical History:  Procedure Laterality Date   ABDOMINAL HYSTERECTOMY     total   APPENDECTOMY     CHOLECYSTECTOMY     COLONOSCOPY     Social History   Occupational History   Not on file  Tobacco Use   Smoking status: Former Smoker    Packs/day: 0.25    Years: 34.00    Pack years: 8.50    Quit date: 11/18/2018    Years since quitting: 0.1   Smokeless tobacco: Never Used  Substance and Sexual Activity   Alcohol use: Yes    Comment: rarely   Drug use: No   Sexual activity: Never

## 2019-01-17 ENCOUNTER — Telehealth: Payer: Self-pay | Admitting: Orthopaedic Surgery

## 2019-01-17 NOTE — Telephone Encounter (Signed)
12/23/2018 MRI Lumbar report faxed to [facility] [phone]

## 2019-01-27 DIAGNOSIS — E039 Hypothyroidism, unspecified: Secondary | ICD-10-CM

## 2019-01-28 ENCOUNTER — Other Ambulatory Visit: Payer: Self-pay | Admitting: Family Medicine

## 2019-01-28 DIAGNOSIS — F419 Anxiety disorder, unspecified: Secondary | ICD-10-CM

## 2019-01-28 DIAGNOSIS — F321 Major depressive disorder, single episode, moderate: Secondary | ICD-10-CM

## 2019-01-28 DIAGNOSIS — E039 Hypothyroidism, unspecified: Secondary | ICD-10-CM

## 2019-02-03 ENCOUNTER — Encounter: Payer: Self-pay | Admitting: Family Medicine

## 2019-02-03 ENCOUNTER — Ambulatory Visit: Payer: Managed Care, Other (non HMO) | Admitting: Family Medicine

## 2019-02-03 ENCOUNTER — Other Ambulatory Visit: Payer: Self-pay

## 2019-02-03 VITALS — BP 130/88 | HR 98 | Temp 97.5°F | Resp 16 | Ht 66.5 in | Wt 157.0 lb

## 2019-02-03 DIAGNOSIS — E039 Hypothyroidism, unspecified: Secondary | ICD-10-CM

## 2019-02-03 DIAGNOSIS — J449 Chronic obstructive pulmonary disease, unspecified: Secondary | ICD-10-CM

## 2019-02-03 DIAGNOSIS — D649 Anemia, unspecified: Secondary | ICD-10-CM

## 2019-02-03 DIAGNOSIS — Z87891 Personal history of nicotine dependence: Secondary | ICD-10-CM

## 2019-02-03 DIAGNOSIS — Z122 Encounter for screening for malignant neoplasm of respiratory organs: Secondary | ICD-10-CM

## 2019-02-03 DIAGNOSIS — F339 Major depressive disorder, recurrent, unspecified: Secondary | ICD-10-CM | POA: Diagnosis not present

## 2019-02-03 DIAGNOSIS — R5383 Other fatigue: Secondary | ICD-10-CM

## 2019-02-03 DIAGNOSIS — I10 Essential (primary) hypertension: Secondary | ICD-10-CM

## 2019-02-03 DIAGNOSIS — F419 Anxiety disorder, unspecified: Secondary | ICD-10-CM | POA: Diagnosis not present

## 2019-02-03 DIAGNOSIS — F1010 Alcohol abuse, uncomplicated: Secondary | ICD-10-CM

## 2019-02-03 DIAGNOSIS — E785 Hyperlipidemia, unspecified: Secondary | ICD-10-CM

## 2019-02-03 DIAGNOSIS — E559 Vitamin D deficiency, unspecified: Secondary | ICD-10-CM

## 2019-02-03 MED ORDER — LOSARTAN POTASSIUM-HCTZ 100-25 MG PO TABS: ORAL_TABLET | ORAL | 0 refills | Status: DC

## 2019-02-03 NOTE — Progress Notes (Signed)
Impression and Recommendations:    1. Depression, recurrent ([place])   2. Anxiety   3. COPD-    4. History of smoking 30 or more pack years   5. HTN, goal below 130/80   6. Hyperlipidemia, unspecified hyperlipidemia type- NEW ONSET   7. Hypothyroidism, unspecified type   8. Encounter for screening for lung cancer   9. Excessive drinking alcohol   10. Hemoglobin low   11. Vitamin D deficiency   12. Other fatigue     Hypertension - New Diagnosis - BP elevated on intake today. - Per patient, elevated on average at home. - Reviewed goal BP of 130/80.  - Begin low dose medication today.  See med list.  - Counseled patient on pathophysiology of disease and discussed various treatment options, which always includes dietary and lifestyle modification as first line.   - Lifestyle changes such as dash and heart healthy diets and engaging in a regular exercise program discussed extensively with patient.   - Ambulatory blood pressure monitoring encouraged at least 3 times weekly.  Keep log and bring in every office visit.  Reminded patient that if they ever feel poorly in any way, to check their blood pressure and pulse.  - Handouts provided at patient's desire and/or told to go online at the [place] [facility] website for further information  - We will continue to monitor  Anxiety, Depression, Major depressive disorder in partial remission  - Suboptimally controlled at this time. - Continues prescription management as established.  - Patient agrees to establish with counseling. - Ambulatory referral to Psychology placed today.  - In addition to therapy and prescription intervention, reviewed the "spokes of the wheel" of mood and health management.  Stressed the importance of ongoing prudent habits, including regular exercise, appropriate sleep hygiene, healthful dietary habits, and prayer/meditation to relax.  - Patient knows to let us know if she desires to have her  treatment plan changed.   Encounter for screening for lung cancer - Last CT lung cancer screen obtained 12/31/2015. - Discussed that this screening should be obtained yearly. - ordered placed    COPD; Tobacco use disorder: >30pk yr hx - Per patient, has stopped smoking. - Last seen by Dr. Patsey Berthold of pulmonology on 11/28/2018. - Continue treatment plan as managed by pulmonology. - Will continue to monitor alongside pulmonology.   Migraines - Discussed that using Imitrex 2-3 times per week indicates migraines are suboptimally managed. - Told patient to keep a migraine headache log along with her BP log. - Will adjust management as needed on review of migraine log. - Will continue to monitor.  Excessive drinking alcohol - Per patient, stable at this time. - Encouraged patient to avoid excessive alcohol use. - Will continue to monitor.  Hyperlipidemia - Last checked July of 2020. - Need for re-check today.  - Pt will continue current treatment regimen.  - Dietary changes such as low saturated & trans fat diets for hyperlipidemia and low carb diets for hypertriglyceridemia discussed with patient.    - Will continue to monitor.   Vitamin D deficiency - Reviewed supplementation with patient today. - Encouraged patient to continue Rx as recommended. - Will continue to monitor and re-check as recommended.   Hypothyroidism, unspecified type - Continue treatment plan as established. - Will continue to monitor.  Lifestyle & Preventative Health Maintenance - Advised patient to continue working toward exercising to improve overall mental, physical, and emotional health.    - Encouraged  patient to engage in daily physical activity as tolerated, especially a formal exercise routine.  Recommended that the patient eventually strive for at least 150 minutes of moderate cardiovascular activity per week according to guidelines established by the [facility].   - Healthy dietary habits  encouraged, including low-carb, and high amounts of lean protein in diet.   - Patient should also consume adequate amounts of water.   Recommendations - Last labs obtained in January 2020-we will obtain full set fasting blood work today since she is fasting and in office. - Return in 6 weeks after starting new BP medication. - Will review BP log and migraines log.  - Novel Covid -19 counseling done; all questions were answered.   - Current CDC / federal and [place] guidelines reviewed with patient  - Reminded pt of extreme importance of social distancing; wearing a mask when out in public; insensate handwashing and cleaning of surfaces, avoiding unnecessary trips for shopping and avoiding ALL but emergency appts etc. - Told patient to be prepared, not scared; and be smart for the sake of others - Patient will call with any additional concerns   Orders Placed This Encounter  Procedures  . CT CHEST LUNG CA SCREEN LOW DOSE W/O CM  . TSH  . T4, free  . Lipid panel  . CBC w/Diff  . CMP (comprehensive metabolic panel)  . VITAMIN D 25 Hydroxy (Vit-D Deficiency, Fractures)  . Ambulatory referral to Psychology    Meds ordered this encounter  Medications  . losartan-hydrochlorothiazide (HYZAAR) 100-25 MG tablet    Sig: Take 1/2 tab daily for 1 week, only inc to 1 tab QD if BP not at goal of 130/80    Dispense:  90 tablet    Refill:  0    Gross side effects, risk and benefits, and alternatives of medications and treatment plan in general discussed with patient.  Patient is aware that all medications have potential side effects and we are unable to predict every side effect or drug-drug interaction that may occur.   Patient will call with any questions prior to using medication if they have concerns.    Expresses verbal understanding and consents to current therapy and treatment regimen.  No barriers to understanding were identified.  Red flag symptoms and signs discussed in detail.   Patient expressed understanding regarding what to do in case of emergency\urgent symptoms  Please see AVS handed out to patient at the end of our visit for further patient instructions/ counseling done pertaining to today's office visit.   Return for f/up DOXY 6 weeks starting new BP med; review BP log and migraine log.     Note:  This note was prepared with assistance of Dragon voice recognition software. Occasional wrong-word or sound-a-like substitutions may have occurred due to the inherent limitations of voice recognition software.   This document serves as a record of services personally performed by Mellody Dance, DO. It was created on her behalf by Toni Amend, a trained medical scribe. The creation of this record is based on the scribe's personal observations and the provider's statements to them.   This case required medical decision making of at least moderate complexity. The above documentation has been reviewed to be accurate and was completed by Marjory Sneddon, D.O.      --------------------------------------------------------------------------------------------------------------------------------------------------------------------------------------------------------------------------------------------    Subjective:   Kellie Jones, am serving as scribe for Dr. Mellody Dance.  HPI: Kellie Jones is a 58 y.o. female who presents to [facility] today for issues as discussed below.  - COPD Patient continues treatment plan per pulmonology.  Notes she liked Dr. Patsey Berthold.  Notes her breathing is no worse, no better.  She's been trying to minimize contact with others during [place].  - Smoking / Tobacco Use States she is no longer smoking.  Thinks her last cigarette was sometime in September/October.  Is not on Chantix.  - Migraines States she is currently taking Imitrex for migraines twice per week.  - Alcohol Use  Says she's drinking more, but "not enough to be an alcoholic."  Notes she usually doesn't drink.  - Vitamin D Deficiency Says she takes her vitamin D, both OTC daily and once-weekly prescription.  - Mood Management; Anxiety, Depression She continues her medications as prescribed.  Says her mood has been "crappy."  "I still have trouble sleeping."  States "it's like I lie down and all my lights come on."  Says she can't shut her mind off; "it's like it's going 90 miles per minute."  She confirms experiencing more anxiety.  She is still out of work.  Agrees to visit counselor.  Notes her back pain is the main source of her distress.  Notes her back and legs are in pain so she hasn't been able to do as much as she would like.  Says "it pisses me off, because I can't get up and do."  HPI:  Hypertension: - Per patient, not checking her BP as often as advised.  Confirms that her BP is elevated at home.  States she is constantly in pain due to her back.  - Patient reports good compliance with medication and/or lifestyle modification  - Her denies acute concerns or problems related to treatment plan  - She denies new onset of: chest pain, exercise intolerance, shortness of breath, dizziness, visual changes, headache, lower extremity swelling or claudication.   Last 3 blood pressure readings in our office are as follows: BP Readings from Last 3 Encounters:  02/03/19 130/88  01/15/19 123/73  11/28/18 136/82   Filed Weights   02/03/19 0844  Weight: 157 lb (71.2 kg)   HPI:  Hyperlipidemia:  58 y.o. female here for cholesterol follow-up.   - Patient reports difficulties obtaining appropriate dosage of medication.  States she re-started taking the 40 mg tablet per night a couple of weeks ago.   - Patient denies any acute concerns or problems with management plan   - She denies new onset of: myalgias, arthralgias, increased fatigue more than normal, chest pains, exercise  intolerance, shortness of breath, dizziness, visual changes, headache, lower extremity swelling or claudication.   Most recent cholesterol panel was:  Lab Results  Component Value Date   CHOL 185 09/03/2018   HDL 50 09/03/2018   LDLCALC 117 (H) 09/03/2018   TRIG 91 09/03/2018   CHOLHDL 3.7 09/03/2018   Hepatic Function Latest Ref Rng & Units 06/19/2018 04/26/2018 03/11/2018  Total Protein 6.5 - 8.1 g/dL - 6.7 6.5  Albumin 3.5 - 5.0 g/dL - 4.4 4.7  AST 15 - 41 U/L - 19 12  ALT 0 - 32 IU/L _0 Alk Phosphatase 38 - 126 U/L - 74 93  Total Bilirubin 0.3 - 1.2 mg/dL - 0.2(L) 0.2      Wt Readings from Last 3 Encounters:  02/03/19 157 lb (71.2 kg)  01/15/19 153 lb (69.4 kg)  11/28/18 153 lb 3.2 oz (69.5 kg)    Pulse Readings from Last  3 Encounters:  02/03/19 98  01/15/19 96  11/28/18 94   BMI Readings from Last 3 Encounters:  02/03/19 24.96 kg/m  01/15/19 24.69 kg/m  11/28/18 24.73 kg/m     Patient Care Team    Relationship Specialty Notifications Start End  Mellody Dance, DO PCP - General Family Medicine  12/07/15   Marybelle Killings, MD Consulting Physician Orthopedic Surgery  02/28/17    Comment: txs her back- work related injury  Tyler Pita, MD Consulting Physician Pulmonary Disease  02/03/19      Patient Active Problem List   Diagnosis Date Noted  . h/o Excessive drinking alcohol 09/24/2017    Priority: High  . Mammogram declined despite multiple attempts to get patient to go especially since her daughter with premature breast cancer 09/24/2017    Priority: High  . 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 Family history of breast cancer in first degree relative---> daughter- at age 53, 11nd daughter 69 08/17/2016    Priority: High  . Hyperalgesia- skin of feet.  07/17/2016    Priority: High  . Tobacco use disorder: >30pk yr hx 01/03/2016    Priority: High  . Hypothyroidism 12/07/2015     Priority: High  . COPD still smoking  12/07/2015    Priority: High  . Vitamin D deficiency 01/03/2016    Priority: Medium  . Environmental and seasonal allergies 01/03/2016    Priority: Medium  . Other fatigue 12/07/2015    Priority: Medium  . Status post lumbar spinal fusion 06/11/2018    Priority: Low  . GERD (gastroesophageal reflux disease) 01/19/2016    Priority: Low  . Tobacco abuse counseling 01/03/2016    Priority: Low  . Bilateral feet cramps- worse at night 12/11/2015    Priority: Low  . Anxiety 12/07/2015    Priority: Low  . Depression, recurrent ([place]) 12/07/2015    Priority: Low  . HTN, goal below 130/80 02/03/2019  . History of smoking 30 or more pack years 02/03/2019  . Hyperlipidemia 10/08/2018  . Lumbar stenosis 05/03/2018  . Preop cardiovascular exam 04/22/2018  . Dyslipidemia 04/22/2018  . 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 Shortness of breath on exertion 04/08/2018  . Radiculopathy, lumbar region 03/06/2018  . Chronic back pain greater than 3 months duration 09/24/2017  . Fibrocystic breast changes 07/17/2016  . Erythrocytosis due to hypoxemia 01/03/2016  . History of gastroesophageal reflux (GERD) 01/03/2016  . Migraine 12/07/2015  . Absolute anemia 12/07/2015    Past Medical history, Surgical history, Family history, Social history, Allergies and Medications have been entered into the medical record, reviewed and changed as needed.    Current Meds  Medication Sig  . albuterol (PROVENTIL HFA;VENTOLIN HFA) 108 (90 Base) MCG/ACT inhaler Inhale 1-2 puffs into the lungs every 6 (six) hours as needed. For wheezing  . Aspirin-Salicylamide-Caffeine (BC HEADACHE) 325-95-16 MG TABS Take 1 packet by mouth 3 (three) times daily as needed (headache).   Marland Kitchen atorvastatin (LIPITOR) 40 MG tablet Take 1 tablet (40 mg total) by mouth at bedtime.  . B Complex-C (B-COMPLEX WITH VITAMIN C) tablet Take 1 tablet by mouth daily.  . bisacodyl (DULCOLAX) 5 MG EC tablet Take  5 mg by mouth daily as needed for moderate constipation.  . calcium carbonate (TUMS - DOSED IN MG ELEMENTAL CALCIUM) 500 MG chewable tablet Chew 1 tablet by mouth daily as needed for indigestion or heartburn.  . Cholecalciferol (VITAMIN D3) 5000  units CAPS Take 5,000 Units by mouth daily.   . DULoxetine (CYMBALTA) 20 MG capsule TAKE 2 CAPSULES (40 MG TOTAL) BY MOUTH ONCE DAILY  . Fluticasone-Umeclidin-Vilant (TRELEGY ELLIPTA) 100-62.5-25 MCG/INH AEPB Inhale 1 puff into the lungs daily. Rinse mouth after use.  . levothyroxine (SYNTHROID) 150 MCG tablet TAKE 1 TABLET BY MOUTH DAILY.  . metaxalone (SKELAXIN) 800 MG tablet Take 800 mg by mouth 3 (three) times daily as needed for muscle spasms.   Marland Kitchen omeprazole (PRILOSEC) 20 MG capsule Take 1 capsule (20 mg total) by mouth 2 (two) times daily before a meal.  . oxyCODONE (ROXICODONE) 5 MG immediate release tablet Take 1 tablet (5 mg total) by mouth every 4 (four) hours as needed.  . sertraline (ZOLOFT) 100 MG tablet TAKE 1 TABLET BY MOUTH AT BEDTIME.  . SUMAtriptan (IMITREX) 100 MG tablet TAKE 1 TABLET BY MOUTH AS NEEDED. TAKE ALONG WITH 400MG IBUPROFEN AT ONSET OF MIGRAINE.  Marland Kitchen TRELEGY ELLIPTA 100-62.5-25 MCG/INH AEPB Take 1 puff by mouth daily.   . varenicline (CHANTIX STARTING MONTH PAK) 0.5 MG X 11 & 1 MG X 42 tablet Take one 0.5 mg tablet by mouth once daily for 3 days, then increase to one 0.5 mg tablet twice daily for 4 days, then increase to one 1 mg tablet twice daily.  . Vitamin D, Ergocalciferol, (DRISDOL) 1.25 MG (50000 UT) CAPS capsule Take one tablet wkly (Patient taking differently: Take 50,000 Units by mouth every 7 (seven) days. )    Allergies:  Allergies  Allergen Reactions  . Iodinated Diagnostic Agents Swelling and Other (See Comments)    Sneezing and eye swelling  . Other Anaphylaxis    Contrast media dye  . Codeine Rash     Review of Systems:  A fourteen system review of systems was performed and found to be positive as per  HPI.   Objective:   Blood pressure 130/88, pulse 98, temperature (!) 97.5 F (36.4 C), temperature source Oral, resp. rate 16, height 5' 6.5" (1.689 m), weight 157 lb (71.2 kg), SpO2 94 %. Body mass index is 24.96 kg/m. General:  Well Developed, well nourished, appropriate for stated age.  Neuro:  Alert and oriented,  extra-ocular muscles intact  HEENT:  Normocephalic, atraumatic, neck supple, no carotid bruits appreciated  Skin:  no gross rash, warm, pink. Cardiac:  RRR, S1 S2 Respiratory:  ECTA B/L and A/P, Not using accessory muscles, speaking in full sentences- unlabored. Vascular:  Ext warm, no cyanosis apprec.; cap RF less 2 sec. Psych:  No HI/SI, judgement and insight good, Euthymic mood. Full Affect.

## 2019-02-03 NOTE — Patient Instructions (Addendum)
Please check your blood pressure daily if possible, and write it down.  Goal blood pressure is 130/80 or less.     Your goal blood pressure should be 130/80 or less on a regular basis, or medications should be started/ modified.    Normal blood pressure is less than 120/80.    Hypertension Hypertension, commonly called high blood pressure, is when the force of blood pumping through the arteries is too strong. The arteries are the blood vessels that carry blood from the heart throughout the body. Hypertension forces the heart to work harder to pump blood and may cause arteries to become narrow or stiff. Having untreated or uncontrolled hypertension can cause heart attacks, strokes, kidney disease, and other problems. A blood pressure reading consists of a higher number over a lower number. Ideally, your blood pressure should be below 120/80. The first ("top") number is called the systolic pressure. It is a measure of the pressure in your arteries as your heart beats. The second ("bottom") number is called the diastolic pressure. It is a measure of the pressure in your arteries as the heart relaxes. What are the causes? The cause of this condition is not known. What increases the risk? Some risk factors for high blood pressure are under your control. Others are not. Factors you can change  Smoking.  Having type 2 diabetes mellitus, high cholesterol, or both.  Not getting enough exercise or physical activity.  Being overweight.  Having too much fat, sugar, calories, or salt (sodium) in your diet.  Drinking too much alcohol. Factors that are difficult or impossible to change  Having chronic kidney disease.  Having a family history of high blood pressure.  Age. Risk increases with age.  Race. You may be at higher risk if you are African-American.  Gender. Men are at higher risk than women before age 64. After age 98, women are at higher risk than men.  Having obstructive sleep  apnea.  Stress. What are the signs or symptoms? Extremely high blood pressure (hypertensive crisis) may cause:  Headache.  Anxiety.  Shortness of breath.  Nosebleed.  Nausea and vomiting.  Severe chest pain.  Jerky movements you cannot control (seizures).  How is this diagnosed? This condition is diagnosed by measuring your blood pressure while you are seated, with your arm resting on a surface. The cuff of the blood pressure monitor will be placed directly against the skin of your upper arm at the level of your heart. It should be measured at least twice using the same arm. Certain conditions can cause a difference in blood pressure between your right and left arms. Certain factors can cause blood pressure readings to be lower or higher than normal (elevated) for a short period of time:  When your blood pressure is higher when you are in a health care provider's office than when you are at home, this is called white coat hypertension. Most people with this condition do not need medicines.  When your blood pressure is higher at home than when you are in a health care provider's office, this is called masked hypertension. Most people with this condition may need medicines to control blood pressure.  If you have a high blood pressure reading during one visit or you have normal blood pressure with other risk factors:  You may be asked to return on a different day to have your blood pressure checked again.  You may be asked to monitor your blood pressure at home for 1 week  or longer.  If you are diagnosed with hypertension, you may have other blood or imaging tests to help your health care provider understand your overall risk for other conditions. How is this treated? This condition is treated by making healthy lifestyle changes, such as eating healthy foods, exercising more, and reducing your alcohol intake. Your health care provider may prescribe medicine if lifestyle changes are  not enough to get your blood pressure under control, and if:  Your systolic blood pressure is above 130.  Your diastolic blood pressure is above 80.  Your personal target blood pressure may vary depending on your medical conditions, your age, and other factors. Follow these instructions at home: Eating and drinking  Eat a diet that is high in fiber and potassium, and low in sodium, added sugar, and fat. An example eating plan is called the DASH (Dietary Approaches to Stop Hypertension) diet. To eat this way: ? Eat plenty of fresh fruits and vegetables. Try to fill half of your plate at each meal with fruits and vegetables. ? Eat whole grains, such as whole wheat pasta, brown rice, or whole grain bread. Fill about one quarter of your plate with whole grains. ? Eat or drink low-fat dairy products, such as skim milk or low-fat yogurt. ? Avoid fatty cuts of meat, processed or cured meats, and poultry with skin. Fill about one quarter of your plate with lean proteins, such as fish, chicken without skin, beans, eggs, and tofu. ? Avoid premade and processed foods. These tend to be higher in sodium, added sugar, and fat.  Reduce your daily sodium intake. Most people with hypertension should eat less than 1,500 mg of sodium a day.  Limit alcohol intake to no more than 1 drink a day for nonpregnant women and 2 drinks a day for men. One drink equals 12 oz of beer, 5 oz of wine, or 1 oz of hard liquor. Lifestyle  Work with your health care provider to maintain a healthy body weight or to lose weight. Ask what an ideal weight is for you.  Get at least 30 minutes of exercise that causes your heart to beat faster (aerobic exercise) most days of the week. Activities may include walking, swimming, or biking.  Include exercise to strengthen your muscles (resistance exercise), such as pilates or lifting weights, as part of your weekly exercise routine. Try to do these types of exercises for 30 minutes at  least 3 days a week.  Do not use any products that contain nicotine or tobacco, such as cigarettes and e-cigarettes. If you need help quitting, ask your health care provider.  Monitor your blood pressure at home as told by your health care provider.  Keep all follow-up visits as told by your health care provider. This is important. Medicines  Take over-the-counter and prescription medicines only as told by your health care provider. Follow directions carefully. Blood pressure medicines must be taken as prescribed.  Do not skip doses of blood pressure medicine. Doing this puts you at risk for problems and can make the medicine less effective.  Ask your health care provider about side effects or reactions to medicines that you should watch for. Contact a health care provider if:  You think you are having a reaction to a medicine you are taking.  You have headaches that keep coming back (recurring).  You feel dizzy.  You have swelling in your ankles.  You have trouble with your vision. Get help right away if:  You develop  a severe headache or confusion.  You have unusual weakness or numbness.  You feel faint.  You have severe pain in your chest or abdomen.  You vomit repeatedly.  You have trouble breathing. Summary  Hypertension is when the force of blood pumping through your arteries is too strong. If this condition is not controlled, it may put you at risk for serious complications.  Your personal target blood pressure may vary depending on your medical conditions, your age, and other factors. For most people, a normal blood pressure is less than 120/80.  Hypertension is treated with lifestyle changes, medicines, or a combination of both. Lifestyle changes include weight loss, eating a healthy, low-sodium diet, exercising more, and limiting alcohol. This information is not intended to replace advice given to you by your health care provider. Make sure you discuss any  questions you have with your health care provider. Document Released: 02/13/2005 Document Revised: 01/12/2016 Document Reviewed: 01/12/2016 Elsevier Interactive Patient Education  2018 [employer].    How to Take Your Blood Pressure   Blood pressure is a measurement of how strongly your blood is pressing against the walls of your arteries. Arteries are blood vessels that carry blood from your heart throughout your body. Your health care provider takes your blood pressure at each office visit. You can also take your own blood pressure at home with a blood pressure machine. You may need to take your own blood pressure:  To confirm a diagnosis of high blood pressure (hypertension).  To monitor your blood pressure over time.  To make sure your blood pressure medicine is working.  Supplies needed: To take your blood pressure, you will need a blood pressure machine. You can buy a blood pressure machine, or blood pressure monitor, at most drugstores or online. There are several types of home blood pressure monitors. When choosing one, consider the following:  Choose a monitor that has an arm cuff.  Choose a monitor that wraps snugly around your upper arm. You should be able to fit only one finger between your arm and the cuff.  Do not choose a monitor that measures your blood pressure from your wrist or finger.  Your health care provider can suggest a reliable monitor that will meet your needs. How to prepare To get the most accurate reading, avoid the following for 30 minutes before you check your blood pressure:  Drinking caffeine.  Drinking alcohol.  Eating.  Smoking.  Exercising.  Five minutes before you check your blood pressure:  Empty your bladder.  Sit quietly without talking in a dining chair, rather than in a soft couch or armchair.  How to take your blood pressure To check your blood pressure, follow the instructions in the manual that came with your blood  pressure monitor. If you have a digital blood pressure monitor, the instructions may be as follows: 1. Sit up straight. 2. Place your feet on the floor. Do not cross your ankles or legs. 3. Rest your left arm at the level of your heart on a table or desk or on the arm of a chair. 4. Pull up your shirt sleeve. 5. Wrap the blood pressure cuff around the upper part of your left arm, 1 inch (2.5 cm) above your elbow. It is best to wrap the cuff around bare skin. 6. Fit the cuff snugly around your arm. You should be able to place only one finger between the cuff and your arm. 7. Position the cord inside the groove of  your elbow. 8. Press the power button. 9. Sit quietly while the cuff inflates and deflates. 10. Read the digital reading on the monitor screen and write it down (record it). 11. Wait 2-3 minutes, then repeat the steps, starting at step 1.  What does my blood pressure reading mean? A blood pressure reading consists of a higher number over a lower number. Ideally, your blood pressure should be below 120/80. The first ("top") number is called the systolic pressure. It is a measure of the pressure in your arteries as your heart beats. The second ("bottom") number is called the diastolic pressure. It is a measure of the pressure in your arteries as the heart relaxes. Blood pressure is classified into four stages. The following are the stages for adults who do not have a short-term serious illness or a chronic condition. Systolic pressure and diastolic pressure are measured in a unit called mm Hg. Normal  Systolic pressure: below 123456.  Diastolic pressure: below 80. Elevated  Systolic pressure: [id number].  Diastolic pressure: below 80. Hypertension stage 1  Systolic pressure: 0000000.  Diastolic pressure: [id number]. Hypertension stage 2  Systolic pressure: [id number] or above.  Diastolic pressure: 90 or above. You can have prehypertension or hypertension even if only the systolic or only the  diastolic number in your reading is higher than normal. Follow these instructions at home:  Check your blood pressure as often as recommended by your health care provider.  Take your monitor to the next appointment with your health care provider to make sure: ? That you are using it correctly. ? That it provides accurate readings.  Be sure you understand what your goal blood pressure numbers are.  Tell your health care provider if you are having any side effects from blood pressure medicine. Contact a health care provider if:  Your blood pressure is consistently high. Get help right away if:  Your systolic blood pressure is higher than 180.  Your diastolic blood pressure is higher than 110. This information is not intended to replace advice given to you by your health care provider. Make sure you discuss any questions you have with your health care provider. Document Released: 07/23/2015 Document Revised: 10/05/2015 Document Reviewed: 07/23/2015 Elsevier Interactive Patient Education  [employer].

## 2019-02-04 LAB — COMPREHENSIVE METABOLIC PANEL
ALT: 16 IU/L (ref 0–32)
AST: 16 IU/L (ref 0–40)
Albumin/Globulin Ratio: 2.4 — ABNORMAL HIGH (ref 1.2–2.2)
Albumin: 4.7 g/dL (ref 3.8–4.9)
Alkaline Phosphatase: 113 IU/L (ref 39–117)
BUN/Creatinine Ratio: 17 (ref 9–23)
BUN: 13 mg/dL (ref 6–24)
Bilirubin Total: 0.2 mg/dL (ref 0.0–1.2)
CO2: 24 mmol/L (ref 20–29)
Calcium: 9.5 mg/dL (ref 8.7–10.2)
Chloride: 103 mmol/L (ref 96–106)
Creatinine, Ser: 0.75 mg/dL (ref 0.57–1.00)
GFR calc Af Amer: 102 mL/min/{1.73_m2} (ref >59)
GFR calc non Af Amer: 88 mL/min/{1.73_m2} (ref >59)
Globulin, Total: 2 g/dL (ref 1.5–4.5)
Glucose: 89 mg/dL (ref 65–99)
Potassium: 4.4 mmol/L (ref 3.5–5.2)
Sodium: 141 mmol/L (ref 134–144)
Total Protein: 6.7 g/dL (ref 6.0–8.5)

## 2019-02-04 LAB — CBC WITH DIFFERENTIAL/PLATELET
Basophils Absolute: 0.1 10*3/uL (ref 0.0–0.2)
Basos: 1 %
EOS (ABSOLUTE): 0.1 10*3/uL (ref 0.0–0.4)
Eos: 2 %
Hematocrit: 45.2 % (ref 34.0–46.6)
Hemoglobin: 15.7 g/dL (ref 11.1–15.9)
Immature Grans (Abs): 0 10*3/uL (ref 0.0–0.1)
Immature Granulocytes: 0 %
Lymphocytes Absolute: 2.4 10*3/uL (ref 0.7–3.1)
Lymphs: 32 %
MCH: 31 pg (ref 26.6–33.0)
MCHC: 34.7 g/dL (ref 31.5–35.7)
MCV: 89 fL (ref 79–97)
Monocytes Absolute: 0.6 10*3/uL (ref 0.1–0.9)
Monocytes: 8 %
Neutrophils Absolute: 4.3 10*3/uL (ref 1.4–7.0)
Neutrophils: 57 %
Platelets: 276 10*3/uL (ref 150–450)
RBC: 5.07 x10E6/uL (ref 3.77–5.28)
RDW: 12.7 % (ref 11.7–15.4)
WBC: 7.5 10*3/uL (ref 3.4–10.8)

## 2019-02-04 LAB — LIPID PANEL
Chol/HDL Ratio: 2.4 ratio (ref 0.0–4.4)
Cholesterol, Total: 134 mg/dL (ref 100–199)
HDL: 56 mg/dL (ref >39)
LDL Chol Calc (NIH): 63 mg/dL (ref 0–99)
Triglycerides: 79 mg/dL (ref 0–149)
VLDL Cholesterol Cal: 15 mg/dL (ref 5–40)

## 2019-02-04 LAB — VITAMIN D 25 HYDROXY (VIT D DEFICIENCY, FRACTURES): Vit D, 25-Hydroxy: 30.9 ng/mL (ref 30.0–100.0)

## 2019-02-04 LAB — TSH: TSH: 0.061 u[IU]/mL — ABNORMAL LOW (ref 0.450–4.500)

## 2019-02-04 LAB — T4, FREE: Free T4: 1.59 ng/dL (ref 0.82–1.77)

## 2019-02-10 ENCOUNTER — Encounter: Payer: Self-pay | Admitting: Orthopaedic Surgery

## 2019-02-17 ENCOUNTER — Encounter: Payer: Self-pay | Admitting: Orthopaedic Surgery

## 2019-02-24 ENCOUNTER — Ambulatory Visit: Payer: Managed Care, Other (non HMO) | Admitting: Pulmonary Disease

## 2019-02-25 ENCOUNTER — Ambulatory Visit: Payer: Managed Care, Other (non HMO) | Admitting: Psychology

## 2019-03-04 ENCOUNTER — Other Ambulatory Visit: Payer: Self-pay | Admitting: Family Medicine

## 2019-03-04 DIAGNOSIS — E039 Hypothyroidism, unspecified: Secondary | ICD-10-CM

## 2019-03-04 DIAGNOSIS — F419 Anxiety disorder, unspecified: Secondary | ICD-10-CM

## 2019-03-04 DIAGNOSIS — F321 Major depressive disorder, single episode, moderate: Secondary | ICD-10-CM

## 2019-03-10 ENCOUNTER — Ambulatory Visit: Payer: Managed Care, Other (non HMO) | Admitting: Pulmonary Disease

## 2019-03-12 ENCOUNTER — Encounter: Payer: Self-pay | Admitting: Family Medicine

## 2019-03-12 NOTE — Telephone Encounter (Signed)
Pt has an upcoming appt on 03/26/2019 and would like to discuss adding Yupelri.  Dr. Patsey Berthold please advise. thanks

## 2019-03-17 ENCOUNTER — Encounter: Payer: Self-pay | Admitting: Family Medicine

## 2019-03-17 ENCOUNTER — Other Ambulatory Visit: Payer: Self-pay

## 2019-03-17 ENCOUNTER — Ambulatory Visit (INDEPENDENT_AMBULATORY_CARE_PROVIDER_SITE_OTHER): Payer: Managed Care, Other (non HMO) | Admitting: Family Medicine

## 2019-03-17 VITALS — BP 134/94 | HR 87 | Temp 98.7°F | Ht 66.0 in | Wt 153.0 lb

## 2019-03-17 DIAGNOSIS — G43001 Migraine without aura, not intractable, with status migrainosus: Secondary | ICD-10-CM

## 2019-03-17 DIAGNOSIS — F411 Generalized anxiety disorder: Secondary | ICD-10-CM | POA: Diagnosis not present

## 2019-03-17 DIAGNOSIS — Z026 Encounter for examination for insurance purposes: Secondary | ICD-10-CM

## 2019-03-17 DIAGNOSIS — F419 Anxiety disorder, unspecified: Secondary | ICD-10-CM | POA: Diagnosis not present

## 2019-03-17 DIAGNOSIS — E039 Hypothyroidism, unspecified: Secondary | ICD-10-CM

## 2019-03-17 DIAGNOSIS — I10 Essential (primary) hypertension: Secondary | ICD-10-CM

## 2019-03-17 DIAGNOSIS — R7989 Other specified abnormal findings of blood chemistry: Secondary | ICD-10-CM

## 2019-03-17 DIAGNOSIS — F321 Major depressive disorder, single episode, moderate: Secondary | ICD-10-CM

## 2019-03-17 DIAGNOSIS — E785 Hyperlipidemia, unspecified: Secondary | ICD-10-CM

## 2019-03-17 MED ORDER — LEVOTHYROXINE SODIUM 150 MCG PO TABS: 150.0000 ug | ORAL_TABLET | Freq: Every day | ORAL | 0 refills | Status: DC

## 2019-03-17 MED ORDER — LOSARTAN POTASSIUM-HCTZ 100-25 MG PO TABS: ORAL_TABLET | ORAL | 0 refills | Status: DC

## 2019-03-17 MED ORDER — VERAPAMIL HCL ER 120 MG PO TBCR: 120.0000 mg | EXTENDED_RELEASE_TABLET | Freq: Every day | ORAL | 0 refills | Status: AC

## 2019-03-17 MED ORDER — SERTRALINE HCL 100 MG PO TABS: 100.0000 mg | ORAL_TABLET | Freq: Every day | ORAL | 0 refills | Status: DC

## 2019-03-17 MED ORDER — TOPIRAMATE 50 MG PO TABS: ORAL_TABLET | ORAL | 0 refills | Status: DC

## 2019-03-17 NOTE — Progress Notes (Signed)
Virtual office visit note for [employer], D.O- Primary Care Physician at [facility]   I connected with current patient today and beyond visually recognizing the correct individual, I verified that I am speaking with the correct person using two identifiers.  . Location of the patient: Home . Location of the provider: Office Only the patient (+/- their family members at pt's discretion) and myself were participating in the encounter   - This visit type was conducted due to national recommendations for restrictions regarding the COVID-19 Pandemic (e.g. social distancing) in an effort to limit this patient's exposure and mitigate transmission in our community.  This format is felt to be most appropriate for this patient at this time.   - No physical exam could be performed with this format, beyond that communicated to [place] by the patient/ family members as noted.   - Additionally my office staff/ schedulers discussed with the patient that there may be a monetary charge related to this service, depending on patient's medical insurance.   The patient expressed understanding, and agreed to proceed.      History of Present Illness: Hypertension, Hypothyroidism, and COPD   I, Toni Amend, am serving as scribe for Dr. Mellody Dance.  Migraine Headaches States "I can't tell a difference" on the new blood pressure medication.  She has had five migraines since last OV, which was December 7th (about a month ago).  Notes "when I got to a whole [tablet] of BP medication, it was like a whole different kind of migraine, and I cut it back down to half and took half in the morning, half at night."  States on the full pill of BP meds, "it seemed like it made the headaches come on faster or worse, or seemed like it triggered it."  On the half-pill, she feels better.  Has been taking the Sumatriptan to treat the migraines.  Since last OV, she's taken it five times.  She hasn't had migraines  like this in 20 years.  Discussed recently with her sister that "something had to change, I just don't know what."    Stress, Depression, and Anxiety during COVID-19 Thinks she's experiencing both anxiety and depression; has "that awful fear of going out somewhere, and I just refuse to go."  She's terrified of contracting COVID-19 "because that will be my deathbed."  She has a lot of fears due to her severe COPD.  Notes "you walk around with all these fears and this severe COPD you've done to yourself, and it just pushes you over the edge."  Denies suicidal ideations.  Difficulty with Insurance Coverage Confirms that her stress levels are worse these days.  She tried to call her insurance recently for coverage for her visit to psychology and was told that since she hadn't met her deductible for the year, she would need to pay out of pocket.    Notes experiencing significant difficulties associated with her workman's compensation claim.  Her workman's compensation is associated with her orthopedic doctor / back injury.  HPI:  Hypertension:  -  Her blood pressure at home has been running: today's measurement of 154/97 is her highest.  Notes on average runs from 125-142, closer to 138, 142 than the lower 130's.  - Patient reports good compliance with medication and/or lifestyle modification.  - Her denies acute concerns or problems related to treatment plan.  Notes carrying more weight and not as active as she used to be, because  it's more painful.  Says "I'm having a hard time getting in a routine of anything (exercise-related especially).  - She denies new onset of: chest pain, exercise intolerance, shortness of breath, dizziness, visual changes, headache, lower extremity swelling or claudication.   Last 3 blood pressure readings in our office are as follows: BP Readings from Last 3 Encounters:  03/17/19 (!) 134/94  02/03/19 130/88  01/15/19 123/73   Filed Weights   03/17/19 0919    Weight: 153 lb (69.4 kg)          Depression screen [facility] 2/9 03/17/2019 02/03/2019 10/08/2018 04/08/2018 11/16/2017  Decreased Interest _0 0 0  Down, Depressed, Hopeless _1 PHQ - 2 Score _2 Altered sleeping _3 Tired, decreased energy _4 Change in appetite _5 0  Feeling bad or failure about yourself  0 1 2 0 0  Trouble concentrating 0 0 3 2 0  Moving slowly or fidgety/restless 0 0 0 0 0  Suicidal thoughts 0 0 0 0 0  PHQ-9 Score _6 Difficult doing work/chores Extremely dIfficult Somewhat difficult - - -  Some recent data might be hidden    GAD 7 : Generalized Anxiety Score 03/17/2019 02/03/2019 04/08/2018 05/18/2017  Nervous, Anxious, on Edge _7 0  Control/stop worrying _8 0  Worry too much - different things 3 3 0 0  Trouble relaxing _9 0  Restless 0 1 3 0  Easily annoyed or irritable _10 0  Afraid - awful might happen 0 3 0 0  Total GAD 7 Score _11 0  Anxiety Difficulty Extremely difficult Somewhat difficult - Not difficult at all     Impression and Recommendations:    1. HTN, goal below 130/80   2. Current moderate episode of major depressive disorder ([place]) - poorly controlled   3. GAD (generalized anxiety disorder) - poorly controlled   4. Anxiety   5. Migraine without aura and with status migrainosus, not intractable   6. Hyperlipidemia, unspecified hyperlipidemia type   7. Hypothyroidism, unspecified type   8. Elevated TSH   9. Worker's Librarian, academic problem     - Last seen 02/03/2019.  Anxiety, GAD, Current Moderate Episode of MDD - poorly controlled - Referred to psychology last OV. - Per pt, did not attend due to difficulty obtaining coverage through insurance.  - Mood still poorly controlled. - As patient is already managed on two prescriptions, no changes made to treatment plan today.  - Ambulatory referral to psychiatry placed today for stress, depression, anxiety  management.  - Managed on Cymbalta and Zoloft.  Continue as prescribed.  See med list. - Discussed that from here forward, psychiatry will decide prescription management.  - In addition to prescription intervention and therapy/counseling, reviewed the "spokes of the wheel" of mood and health management.  Stressed the importance of ongoing prudent habits, including regular exercise, appropriate sleep hygiene, healthful dietary habits, and prayer/meditation to relax.  - Will continue to monitor.  Hypertension - Goal Below 130/80 on average - Last OV, told patient to keep BP log. - Started medication last OV.  See med list.  - BP remains suboptimally controlled. - In terms of treatment plan, discussed risks, benefits, and alternatives. - Discussed that we will avoid beta-blockers due to the possibility of these contributing  to depression.  - Reminded patient that with her COPD, she should sit quietly for 15-20 minutes without moving for her BP to stabilize prior to measurement.  - Begin Verapamil today, in addition to losartan-HCTZ. - Discussed the Verapamil works synergistically with the Topamax to decrease migraine headaches. - Goal BP is 135/85 or less on a consistent basis.  - Counseled patient on pathophysiology of disease and discussed various treatment options, which always includes dietary and lifestyle modification as first line.   - Lifestyle changes such as dash and heart healthy diets and engaging in a regular exercise program discussed extensively with patient.   - Ambulatory blood pressure monitoring encouraged at least 3 times weekly.  Keep log and bring in every office visit.  Reminded patient that if they ever feel poorly in any way, to check their blood pressure and pulse.  - Will continue to monitor. - Patient knows to reach out sooner if she experiences questions or concerns.  Migraine Headache w/out Aura & Status Migrainosus, Not Intractable - Last OV, told patient  to keep migraine log. - Per patient, has had five migraines since last OV.  - Extensively reviewed patient's symptoms during appointment today.  Discussed that stress, depression, and anxiety can contribute to increased migraines.  - Begin low-dose Topamax daily for prevention, alongside Verapamil for BP.  Risks, benefits, and alternatives discussed with patient today.  Reviewed goal on management to reduce migraine incidence, decrease severity and frequency.  Begin with one-half tablet by mouth daily for one week, then increase dose to one tablet daily.  - Patient will continue using Imitrex for acute abortive therapy.  See med list.  - Will continue to monitor.  Hypothyroidism - Elevated TSH - No change in medication last OV. - Discussed need for patient to return near future for lab work to check TSH, T4, T3. - Discussed that patient should come in sooner if her headaches remain poorly controlled. - Will continue to monitor.  - reminded pt to go for her screening CT lung scan and/or address with her pulmonologist near future.  Referral placed last time and patient still has not gone for this.  - As part of my medical decision making, I reviewed the following data within the [place] History obtained from pt /family, CMA notes reviewed and incorporated if applicable, Labs reviewed, Radiograph/ tests reviewed if applicable and OV notes from prior OV's with me, as well as other specialists she/he has seen since seeing me last, were all reviewed and used in my medical decision making process today.   - Additionally, discussion had with patient regarding txmnt plan, their biases about that plan etc were used in my medical decision making today.   - The patient agreed with the plan and demonstrated an understanding of the instructions.   No barriers to understanding were identified.    - Red flag symptoms and signs discussed in detail.  Patient expressed understanding regarding  what to do in case of emergency\ urgent symptoms.  The patient was advised to call back or seek an in-person evaluation if the symptoms worsen or if the condition fails to improve as anticipated.   Return started topamax, verapamil- for HA /bp, and refer to psychiatry-mood mgt, for bldwrk(thyroid labs) near future, f/up OV 1 month for headache, BP, stress- .    Orders Placed This Encounter  Procedures  . Ambulatory referral to Psychiatry    Meds ordered this encounter  Medications  . levothyroxine (SYNTHROID) 150  MCG tablet    Sig: Take 1 tablet (150 mcg total) by mouth daily.    Dispense:  90 tablet    Refill:  0  . sertraline (ZOLOFT) 100 MG tablet    Sig: Take 1 tablet (100 mg total) by mouth at bedtime.    Dispense:  30 tablet    Refill:  0  . losartan-hydrochlorothiazide (HYZAAR) 100-25 MG tablet    Sig: 1/2 tab bid    Dispense:  90 tablet    Refill:  0  . topiramate (TOPAMAX) 50 MG tablet    Sig: One half tab by mouth daily for 1-2wks then one tab by mouth daily.    Dispense:  90 tablet    Refill:  0  . verapamil (CALAN-SR) 120 MG CR tablet    Sig: Take 1 tablet (120 mg total) by mouth at bedtime.    Dispense:  90 tablet    Refill:  0    Medications Discontinued During This Encounter  Medication Reason  . varenicline (CHANTIX STARTING MONTH PAK) 0.5 MG X 11 & 1 MG X 42 tablet Error  . oxyCODONE (ROXICODONE) 5 MG immediate release tablet Error  . bisacodyl (DULCOLAX) 5 MG EC tablet Error  . calcium carbonate (TUMS - DOSED IN MG ELEMENTAL CALCIUM) 500 MG chewable tablet Error  . Vitamin D, Ergocalciferol, (DRISDOL) 1.25 MG (50000 UT) CAPS capsule Error  . TRELEGY ELLIPTA 100-62.5-25 MCG/INH AEPB Error  . metaxalone (SKELAXIN) 800 MG tablet Error  . sertraline (ZOLOFT) 100 MG tablet Reorder  . levothyroxine (SYNTHROID) 150 MCG tablet Reorder  . losartan-hydrochlorothiazide (HYZAAR) 100-25 MG tablet       I provided 21+ minutes of non face-to-face time during this  encounter.  Additional time was spent with charting and coordination of care before and after the actual visit commenced.  Note:  This note was prepared with assistance of Dragon voice recognition software. Occasional wrong-word or sound-a-like substitutions may have occurred due to the inherent limitations of voice recognition software.   This document serves as a record of services personally performed by Mellody Dance, DO. It was created on her behalf by Toni Amend, a trained medical scribe. The creation of this record is based on the scribe's personal observations and the provider's statements to them.   This case required medical decision making of at least moderate complexity. The above documentation has been reviewed to be accurate and was completed by Marjory Sneddon, D.O.      Patient Care Team    Relationship Specialty Notifications Start End  Mellody Dance, DO PCP - General Family Medicine  12/07/15   Marybelle Killings, MD Consulting Physician Orthopedic Surgery  02/28/17    Comment: txs her back- work related injury  Tyler Pita, MD Consulting Physician Pulmonary Disease  02/03/19     -Vitals obtained; medications/ allergies reconciled;  personal medical, social, Sx etc.histories were updated by CMA, reviewed by me and are reflected in chart  Patient Active Problem List   Diagnosis Date Noted  . h/o Excessive drinking alcohol 09/24/2017  . Mammogram declined despite multiple attempts to get patient to go especially since her daughter with premature breast cancer 09/24/2017  . 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 Family history of breast cancer in first degree relative---> daughter- at age 43, 87nd daughter 37 08/17/2016  . Hyperalgesia- skin of feet.  07/17/2016  . Tobacco use disorder: >30pk yr hx 01/03/2016  . Hypothyroidism  12/07/2015  . COPD still smoking  12/07/2015  . Vitamin D deficiency 01/03/2016  .  Environmental and seasonal allergies 01/03/2016  . Other fatigue 12/07/2015  . Status post lumbar spinal fusion 06/11/2018  . GERD (gastroesophageal reflux disease) 01/19/2016  . Tobacco abuse counseling 01/03/2016  . Bilateral feet cramps- worse at night 12/11/2015  . Anxiety 12/07/2015  . Depression, recurrent ([place]) 12/07/2015  . Current moderate episode of major depressive disorder ([place]) 03/17/2019  . GAD (generalized anxiety disorder) - poorly controlled 03/17/2019  . Elevated TSH 03/17/2019  . HTN, goal below 130/80 02/03/2019  . History of smoking 30 or more pack years 02/03/2019  . Hyperlipidemia 10/08/2018  . Lumbar stenosis 05/03/2018  . Preop cardiovascular exam 04/22/2018  . Dyslipidemia 04/22/2018  . 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 Shortness of breath on exertion 04/08/2018  . Radiculopathy, lumbar region 03/06/2018  . Chronic back pain greater than 3 months duration 09/24/2017  . Fibrocystic breast changes 07/17/2016  . Erythrocytosis due to hypoxemia 01/03/2016  . History of gastroesophageal reflux (GERD) 01/03/2016  . Migraine 12/07/2015  . Absolute anemia 12/07/2015     Current Meds  Medication Sig  . albuterol (PROVENTIL HFA;VENTOLIN HFA) 108 (90 Base) MCG/ACT inhaler Inhale 1-2 puffs into the lungs every 6 (six) hours as needed. For wheezing  . Aspirin-Salicylamide-Caffeine (BC HEADACHE) 325-95-16 MG TABS Take 1 packet by mouth 3 (three) times daily as needed (headache).   Marland Kitchen atorvastatin (LIPITOR) 40 MG tablet Take 1 tablet (40 mg total) by mouth at bedtime.  . B Complex-C (B-COMPLEX WITH VITAMIN C) tablet Take 1 tablet by mouth daily.  . DULoxetine (CYMBALTA) 20 MG capsule TAKE 2 CAPSULES (40 MG TOTAL) BY MOUTH ONCE DAILY  . Fluticasone-Umeclidin-Vilant (TRELEGY ELLIPTA) 100-62.5-25 MCG/INH AEPB Inhale 1 puff into the lungs daily. Rinse mouth after use.  . levothyroxine (SYNTHROID) 150 MCG tablet Take 1 tablet (150 mcg total) by mouth daily.  Marland Kitchen  losartan-hydrochlorothiazide (HYZAAR) 100-25 MG tablet 1/2 tab bid  . omeprazole (PRILOSEC) 20 MG capsule Take 1 capsule (20 mg total) by mouth 2 (two) times daily before a meal.  . sertraline (ZOLOFT) 100 MG tablet Take 1 tablet (100 mg total) by mouth at bedtime.  . SUMAtriptan (IMITREX) 100 MG tablet TAKE 1 TABLET BY MOUTH AS NEEDED. TAKE ALONG WITH 400MG IBUPROFEN AT ONSET OF MIGRAINE.  . [DISCONTINUED] levothyroxine (SYNTHROID) 150 MCG tablet TAKE 1 TABLET BY MOUTH DAILY.  . [DISCONTINUED] losartan-hydrochlorothiazide (HYZAAR) 100-25 MG tablet Take 1/2 tab daily for 1 week, only inc to 1 tab QD if BP not at goal of 130/80  . [DISCONTINUED] sertraline (ZOLOFT) 100 MG tablet TAKE 1 TABLET BY MOUTH AT BEDTIME.     Allergies  Allergen Reactions  . Iodinated Diagnostic Agents Swelling and Other (See Comments)    Sneezing and eye swelling  . Other Anaphylaxis    Contrast media dye  . Codeine Rash     ROS:  See above HPI for pertinent positives and negatives   Objective:   Blood pressure (!) 134/94, pulse 87, temperature 98.7 F (37.1 C), height _0  (1.676 m), weight 153 lb (69.4 kg).  (if some vitals are omitted, this means that patient was UNABLE to obtain them even though they were asked to get them prior to OV today.  They were asked to call us at their earliest convenience with these once obtained.)  General: A & O * 3; visually in no acute distress; in usual state of health.  Skin: Visible  skin appears normal and pt's usual skin color HEENT:  EOMI, head is normocephalic and atraumatic.  Sclera are anicteric. Neck has a good range of motion.  Lips are noncyanotic Chest: normal chest excursion and movement Respiratory: speaking in full sentences, no conversational dyspnea; no use of accessory muscles Psych: insight good, mood- appears full

## 2019-03-19 ENCOUNTER — Encounter: Payer: Self-pay | Admitting: Orthopaedic Surgery

## 2019-03-19 ENCOUNTER — Ambulatory Visit (INDEPENDENT_AMBULATORY_CARE_PROVIDER_SITE_OTHER): Payer: Worker's Compensation | Admitting: Orthopaedic Surgery

## 2019-03-19 ENCOUNTER — Other Ambulatory Visit: Payer: Self-pay

## 2019-03-19 VITALS — BP 127/79 | HR 104 | Ht 66.0 in | Wt 153.0 lb

## 2019-03-19 DIAGNOSIS — Z981 Arthrodesis status: Secondary | ICD-10-CM

## 2019-03-19 NOTE — Progress Notes (Signed)
Office Visit Note   Patient: Kellie Jones           Date of Birth: Jul 26, 1960           MRN: [id number] Visit Date: 03/19/2019              Requested by: Mellody Dance, DO [place],  [place] [id number] PCP: Mellody Dance, DO   Assessment & Plan: Visit Diagnoses:  1. 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  This places her in a sedentary with abilities into the light physical demand level for occasional lifting.  Patient is at [employer].  Impairment rating for 1 level lumbar fusion is 25% of the back in line with the workers comp [employer] guide for single level fusion.  Patient is imminent office follow-up as needed.  Follow-Up Instructions: No follow-ups on file.   Orders:  No orders of the defined types were placed in this encounter.  No orders of the defined types were placed in this encounter.     Procedures: No procedures performed   Clinical Data: No additional findings.   Subjective: Chief Complaint  Patient presents with  . Lower Back - Follow-up, Pain    FCE review    HPI 59 year old female returns for follow-up post FCE.  FCE report was conducted on 01/30/2019.  Report is finally available for review.  Report shows patient gave good consistent effort no evidence of symptom magnification.  She fell in the sedentary to light physical demand level for occasional lifting.  Review of Systems 14 point systems updated and unchanged.   Objective: Vital Signs: BP 127/79   Pulse (!) 104   Ht 5\' 6"  (1.676 m)   Wt 153 lb (69.4 kg)   BMI 24.69 kg/m   Physical Exam Constitutional:      Appearance: She is well-developed.  HENT:     Head: Normocephalic.     Right Ear: External ear normal.     Left Ear: External ear normal.  Eyes:     Pupils: Pupils are equal, round, and reactive to light.  Neck:     Thyroid: No thyromegaly.   Trachea: No tracheal deviation.  Cardiovascular:     Rate and Rhythm: Normal rate.  Pulmonary:     Effort: Pulmonary effort is normal.  Abdominal:     Palpations: Abdomen is soft.  Skin:    General: Skin is warm and dry.  Neurological:     Mental Status: She is alert and oriented to person, place, and time.  Psychiatric:        Behavior: Behavior normal.     Ortho Exam patient is amatory lumbar incisions well-healed.  Anterior tib gastrocsoleus is intact.  Specialty Comments:  No specialty comments available.  Imaging: No results found.   PMFS History: Patient Active Problem List   Diagnosis Date Noted  . Current moderate episode of major depressive disorder ([place]) 03/17/2019  . GAD (generalized anxiety disorder) - poorly controlled 03/17/2019  . Elevated TSH 03/17/2019  . HTN, goal below 130/80 02/03/2019  . History of smoking 30 or more pack years 02/03/2019  . Hyperlipidemia 10/08/2018  . Status post lumbar spinal fusion 06/11/2018  . Lumbar stenosis 05/03/2018  . Preop cardiovascular exam 04/22/2018  . Dyslipidemia 04/22/2018  . 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 Shortness of breath on exertion 04/08/2018  . Radiculopathy, lumbar region  03/06/2018  . Chronic back pain greater than 3 months duration 09/24/2017  . h/o Excessive drinking alcohol 09/24/2017  . Mammogram declined despite multiple attempts to get patient to go especially since her daughter with premature breast cancer 09/24/2017  . 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 Family history of breast cancer in first degree relative---> daughter- at age 42, 1nd daughter 59 08/17/2016  . Hyperalgesia- skin of feet.  07/17/2016  . Fibrocystic breast changes 07/17/2016  . GERD (gastroesophageal reflux disease) 01/19/2016  . Vitamin D deficiency 01/03/2016  . Tobacco use disorder: >30pk yr hx 01/03/2016  . Tobacco abuse counseling 01/03/2016  . Erythrocytosis due to  hypoxemia 01/03/2016  . History of gastroesophageal reflux (GERD) 01/03/2016  . Environmental and seasonal allergies 01/03/2016  . Bilateral feet cramps- worse at night 12/11/2015  . Migraine 12/07/2015  . Hypothyroidism 12/07/2015  . COPD still smoking  12/07/2015  . Anxiety 12/07/2015  . Depression, recurrent ([place]) 12/07/2015  . Other fatigue 12/07/2015  . Absolute anemia 12/07/2015   Past Medical History:  Diagnosis Date  . Anxiety   . Arthritis   . Asthma due to seasonal allergies   . Constipation   . COPD (chronic obstructive pulmonary disease) ([place])   . Depression   . Dyslipidemia   . Dyspnea   . History of blood transfusion   . History of kidney stones    several passed  . Hypothyroidism   . Migraine    Migraines  . Neuropathic pain   . Pneumonia    frequent - last time 2015 ish  . PONV (postoperative nausea and vomiting)   . Spinal stenosis     Family History  Problem Relation Age of Onset  . Depression Mother   . Cancer Father 84       pancreatic  . Healthy Sister   . Healthy Brother   . Cancer Maternal Aunt 31       breast  . Cancer Maternal Grandmother 26       breast  . Healthy Brother   . CAD Brother 36       CABG  . Healthy Brother     Past Surgical History:  Procedure Laterality Date  . ABDOMINAL HYSTERECTOMY     total  . APPENDECTOMY    . CHOLECYSTECTOMY    . COLONOSCOPY     Social History   Occupational History  . Not on file  Tobacco Use  . Smoking status: Former Smoker    Packs/day: 0.25    Years: 34.00    Pack years: 8.50    Quit date: 11/18/2018    Years since quitting: 0.3  . Smokeless tobacco: Never Used  Substance and Sexual Activity  . Alcohol use: Yes    Comment: rarely  . Drug use: No  . Sexual activity: Never

## 2019-03-21 ENCOUNTER — Other Ambulatory Visit: Payer: Self-pay

## 2019-03-21 ENCOUNTER — Other Ambulatory Visit: Payer: Managed Care, Other (non HMO)

## 2019-03-21 DIAGNOSIS — E039 Hypothyroidism, unspecified: Secondary | ICD-10-CM

## 2019-03-22 LAB — TSH: TSH: 0.217 u[IU]/mL — ABNORMAL LOW (ref 0.450–4.500)

## 2019-03-22 LAB — T4, FREE: Free T4: 1.45 ng/dL (ref 0.82–1.77)

## 2019-03-22 LAB — T3: T3, Total: 78 ng/dL (ref 71–180)

## 2019-03-24 ENCOUNTER — Other Ambulatory Visit: Payer: Self-pay | Admitting: Family Medicine

## 2019-03-24 DIAGNOSIS — E039 Hypothyroidism, unspecified: Secondary | ICD-10-CM

## 2019-03-24 MED ORDER — LEVOTHYROXINE SODIUM 150 MCG PO TABS: ORAL_TABLET | ORAL | 0 refills | Status: DC

## 2019-03-24 MED ORDER — LEVOTHYROXINE SODIUM 125 MCG PO TABS: ORAL_TABLET | ORAL | 1 refills | Status: DC

## 2019-03-26 ENCOUNTER — Ambulatory Visit (INDEPENDENT_AMBULATORY_CARE_PROVIDER_SITE_OTHER): Payer: Managed Care, Other (non HMO) | Admitting: Pulmonary Disease

## 2019-03-26 DIAGNOSIS — F17201 Nicotine dependence, unspecified, in remission: Secondary | ICD-10-CM

## 2019-03-26 DIAGNOSIS — J449 Chronic obstructive pulmonary disease, unspecified: Secondary | ICD-10-CM

## 2019-03-26 DIAGNOSIS — R0602 Shortness of breath: Secondary | ICD-10-CM | POA: Diagnosis not present

## 2019-03-26 MED ORDER — YUPELRI 175 MCG/3ML IN SOLN: 1.0000 | Freq: Every day | RESPIRATORY_TRACT | 5 refills | Status: DC

## 2019-03-26 MED ORDER — PERFOROMIST 20 MCG/2ML IN NEBU: 20.0000 ug | INHALATION_SOLUTION | Freq: Two times a day (BID) | RESPIRATORY_TRACT | 5 refills | Status: DC

## 2019-03-26 MED ORDER — BUDESONIDE 0.25 MG/2ML IN SUSP: 0.2500 mg | Freq: Two times a day (BID) | RESPIRATORY_TRACT | 5 refills | Status: DC

## 2019-03-26 NOTE — Patient Instructions (Addendum)
We are switching your medications to nebulizer medicines.  This will mean that you will stop Trelegy   You will have 3 nebulizer medicines that we will go as follows: Perforomist 1 vial twice a day via nebulizer Budesonide (Pulmicort) 1 vial twice a day via nebulizer use AFTER Perforomist Yupelri 1 vial daily (this 1 can mix with the Perforomist, you can mix them together)  These medications would give you the equivalent of Trelegy but will get deeper into the lung  We will see you in follow-up in 4 weeks time.  Call sooner if you have any difficulties

## 2019-03-26 NOTE — Progress Notes (Signed)
   Subjective:    Patient ID: Kellie Jones, female    DOB: 08-08-1960, 59 y.o.   MRN: [id number] Virtual Visit Via Video or Telephone Note:   This visit type was conducted due to national recommendations for restrictions regarding the COVID-19 pandemic .  This format is felt to be most appropriate for this patient at this time.  All issues noted in this document were discussed and addressed.  No physical exam was performed (except for noted visual exam findings with Video Visits).    I connected with Chari Manning by  telephone at 9:38 AM and verified that I was speaking with the correct person using two identifiers. Location patient: home Location provider: [facility] Persons participating in the virtual visit: patient, physician   I discussed the limitations, risks, security and privacy concerns of performing an evaluation and management service by video and the availability of in person appointments. The patient expressed understanding and agreed to proceed. 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  First evaluated by me in October 2020.  She has been maintained on Trelegy Ellipta and as needed albuterol.  On our phone conversation today she is obviously dyspneic.  She has significant conversational dyspnea.  We have checked alpha-1 antitrypsin this has been normal.  She has not had any fevers, chills or sweats but continues to have worsening dyspnea.  She feels that she does not get benefit from the inhaled bronchodilators.  This is highly likely as her last PFTs performed at [facility] in 2019 showed that she has severe air trapping and she likely has diminished breath-holding capacity due to her very impaired FEV1.  She has not had any fevers, chills or sweats she voices no other complaint.  Telephone visit today is limited due to her conversational dyspnea.  The patient has been abstinent of cigarette since  September 2020.  She has been commended on this.   Review of Systems A 10 point review of systems was performed and it is as noted above otherwise negative.   Objective:   Physical Exam  No physical exam performed due to the nature of the visit which is not in person.      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   See the patient in 4 weeks time or sooner should any new difficulties arise.  Total visit time 18 minutes   C. Derrill Kay, MD [facility]   *This note was dictated using voice recognition software/Dragon.  Despite best efforts to proofread, errors can occur which can change the meaning.  Any change was purely unintentional.

## 2019-03-28 MED ORDER — YUPELRI 175 MCG/3ML IN SOLN: 1.0000 | Freq: Every day | RESPIRATORY_TRACT | 5 refills | Status: DC

## 2019-03-28 MED ORDER — BUDESONIDE 0.25 MG/2ML IN SUSP: 0.2500 mg | Freq: Two times a day (BID) | RESPIRATORY_TRACT | 5 refills | Status: AC

## 2019-03-28 MED ORDER — PERFOROMIST 20 MCG/2ML IN NEBU: 20.0000 ug | INHALATION_SOLUTION | Freq: Two times a day (BID) | RESPIRATORY_TRACT | 5 refills | Status: DC

## 2019-03-28 NOTE — Telephone Encounter (Signed)
Spoke to pt via telephone and explained reasoning for meds to be sent to DME company. Pt stated that she would like for these meds to come from local pharmacy if possible.  I spoke to [facility], who stated that perforomist and Maretta Bees could be ordered depending on pt's insurance. Called pt back and relayed message from [facility]. Pt requested that Rx for perforomist, Yupelri and pulmicort be sent to [facility].  Rx have been sent as requested. Pt stated that she would communicate with [facility] and call our office back with update.

## 2019-04-11 ENCOUNTER — Encounter: Payer: Self-pay | Admitting: Pulmonary Disease

## 2019-04-17 ENCOUNTER — Ambulatory Visit: Payer: Managed Care, Other (non HMO) | Admitting: Family Medicine

## 2019-04-21 ENCOUNTER — Other Ambulatory Visit: Payer: Self-pay | Admitting: Family Medicine

## 2019-04-21 DIAGNOSIS — G8929 Other chronic pain: Secondary | ICD-10-CM

## 2019-04-21 DIAGNOSIS — F321 Major depressive disorder, single episode, moderate: Secondary | ICD-10-CM

## 2019-04-24 ENCOUNTER — Ambulatory Visit: Payer: Managed Care, Other (non HMO) | Admitting: Pulmonary Disease

## 2019-04-24 ENCOUNTER — Other Ambulatory Visit: Payer: Self-pay

## 2019-04-24 ENCOUNTER — Encounter: Payer: Self-pay | Admitting: Pulmonary Disease

## 2019-04-24 VITALS — BP 120/82 | HR 84 | Temp 97.9°F | Ht 66.0 in | Wt 158.8 lb

## 2019-04-24 DIAGNOSIS — J449 Chronic obstructive pulmonary disease, unspecified: Secondary | ICD-10-CM

## 2019-04-24 DIAGNOSIS — F17201 Nicotine dependence, unspecified, in remission: Secondary | ICD-10-CM

## 2019-04-24 DIAGNOSIS — R0602 Shortness of breath: Secondary | ICD-10-CM | POA: Diagnosis not present

## 2019-04-24 NOTE — Progress Notes (Signed)
Subjective:    Patient ID: Kellie Jones, female    DOB: 1960/11/14, 59 y.o.   MRN: [id number]  HPI Patient is a 59 year old with very severe COPD, GOLD class IV, on the basis of chronic bronchitis and emphysema. First evaluated by me in October 2020.  Most recent visit was via phone on 03/26/2019.    At that time we switched her inhaler to medications via nebulizer namely Perforomist, Pulmicort and Yupelri.  We have checked alpha-1 antitrypsin this has been normal.  She has not had any fevers, chills or sweats but continues to have disabling dyspnea. PFTs performed at [facility] in 2019 showed that she has severe air trapping and she likely has diminished breath-holding capacity due to her very impaired FEV1.  She complains that she has to use the nebulizers too frequently (3 times a day) we discussed that the Perforomist and Maretta Bees can be mixed together and then she can follow with the Pulmicort this will limit her sessions to twice a day with performance Yupelri and Pulmicort in the morning and Perforomist and Pulmicort in the evening.  She may use rescue albuterol in between.  We discussed also consideration for palliative care.  She is currently not interested in this.   Review of Systems A 10 point review of systems was performed and it is as noted above otherwise negative.  Allergies  Allergen Reactions  . Iodinated Diagnostic Agents Swelling and Other (See Comments)    Sneezing and eye swelling  . Other Anaphylaxis    Contrast media dye  . Codeine Rash   Medications reviewed.  Immunization History  Administered Date(s) Administered  . Influenza,inj,Quad PF,6+ Mos 02/15/2016, 01/15/2017, 12/14/2017, 11/28/2018  . Pneumococcal Polysaccharide-23 09/15/2015      Objective:   Physical Exam BP 120/82 (BP Location: Left Arm, Patient Position: Sitting, Cuff Size: Large)   Pulse 84   Temp 97.9 F (36.6 C) (Temporal)   Ht 5\' 6"  (1.676 m)   Wt 158 lb 12.8 oz (72 kg)    SpO2 94% Comment: on ra  BMI 25.63 kg/m   GENERAL: Thin well-developed woman in no respiratory distress, pursed lip breathing noted.  Accessory muscle use noted, chronic. HEAD: Normocephalic, atraumatic.  EYES: Pupils equal, round, reactive to light.  No scleral icterus.  MOUTH: Nose/mouth/throat not examined due to masking requirements for COVID 19. NECK: Supple. No thyromegaly. No nodules. No JVD.  PULMONARY: Lungs with distant breath sounds bilaterally no wheezes or rhonchi noted.  Coarse breath sounds.  Poor air movement in general. CARDIOVASCULAR: S1 and S2. Regular rate and rhythm.  No murmurs appreciated GASTROINTESTINAL: Scaphoid abdomen, soft, nondistended MUSCULOSKELETAL: No joint swelling, no clubbing, no edema.  NEUROLOGIC: Awake alert, no gross focal deficits. SKIN: Intact,warm,dry.  Limited exam no rashes PSYCH: Mood and behavior are appropriate.      Assessment & Plan:     ICD-10-CM   1. Stage 4 very severe COPD by GOLD classification ([place])  J44.9    Inhalers no longer helpful due to inability to breath-hold Perforomist, Pulmicort and Yupelri-continue Consider palliative care Continue nocturnal oxygen  2. Shortness of breath  R06.02    Due to her very severe COPD as noted above  3. Tobacco dependence in remission  F17.201    Continues to abstain from cigarettes.   Discussion:   Patient has very severe COPD, gold class IV.  We discussed a regimen for her nebulizer treatments.  She feels overall that these help her though she continues to have  debilitating dyspnea.  We have initiated conversations with regards to end-of-life issues.  Discussed possibility of palliative care.  She is considering this.  We will see her in follow-up in 3 months time she is to contact us prior to that time should any new difficulties arise.   Renold Don, MD [facility]   *This note was dictated using voice recognition software/Dragon.  Despite best efforts to proofread,  errors can occur which can change the meaning.  Any change was purely unintentional.

## 2019-04-24 NOTE — Patient Instructions (Signed)
Perforomist can be mixed with Yupelri and used together in the nebulizer.  Do this in the morning then you can follow with budesonide (Pulmicort)  In the evening you can do Perforomist and budesonide  Hopefully this will limit your sessions with the nebulizer to twice a day   Let us see you in follow-up in 3 months time call sooner should any new difficulties arise

## 2019-05-06 ENCOUNTER — Other Ambulatory Visit: Payer: Self-pay | Admitting: Family Medicine

## 2019-05-06 ENCOUNTER — Encounter: Payer: Self-pay | Admitting: Family Medicine

## 2019-05-06 DIAGNOSIS — E039 Hypothyroidism, unspecified: Secondary | ICD-10-CM

## 2019-05-06 NOTE — Progress Notes (Signed)
Return started topamax, verapamil- for HA /bp, and refer to psychiatry-mood mgt, for bldwrk(thyroid labs) near future, f/up OV 1 month for headache, BP, stress- .

## 2019-05-07 ENCOUNTER — Other Ambulatory Visit: Payer: Managed Care, Other (non HMO)

## 2019-05-12 ENCOUNTER — Other Ambulatory Visit: Payer: Self-pay

## 2019-05-12 ENCOUNTER — Other Ambulatory Visit: Payer: Managed Care, Other (non HMO)

## 2019-05-12 ENCOUNTER — Other Ambulatory Visit: Payer: 59

## 2019-05-12 DIAGNOSIS — E039 Hypothyroidism, unspecified: Secondary | ICD-10-CM

## 2019-05-13 ENCOUNTER — Ambulatory Visit (HOSPITAL_COMMUNITY): Payer: Managed Care, Other (non HMO) | Admitting: Psychiatry

## 2019-05-13 LAB — T3: T3, Total: 101 ng/dL (ref 71–180)

## 2019-05-13 LAB — TSH: TSH: 0.358 u[IU]/mL — ABNORMAL LOW (ref 0.450–4.500)

## 2019-05-13 LAB — T4, FREE: Free T4: 1.3 ng/dL (ref 0.82–1.77)

## 2019-05-14 ENCOUNTER — Other Ambulatory Visit: Payer: Self-pay | Admitting: Family Medicine

## 2019-05-14 DIAGNOSIS — M549 Dorsalgia, unspecified: Secondary | ICD-10-CM

## 2019-05-14 DIAGNOSIS — F411 Generalized anxiety disorder: Secondary | ICD-10-CM

## 2019-05-14 DIAGNOSIS — G8929 Other chronic pain: Secondary | ICD-10-CM

## 2019-05-14 DIAGNOSIS — F321 Major depressive disorder, single episode, moderate: Secondary | ICD-10-CM

## 2019-05-14 DIAGNOSIS — I10 Essential (primary) hypertension: Secondary | ICD-10-CM

## 2019-05-14 DIAGNOSIS — G43001 Migraine without aura, not intractable, with status migrainosus: Secondary | ICD-10-CM

## 2019-05-19 ENCOUNTER — Encounter: Payer: Self-pay | Admitting: Family Medicine

## 2019-05-19 ENCOUNTER — Ambulatory Visit (INDEPENDENT_AMBULATORY_CARE_PROVIDER_SITE_OTHER): Payer: 59 | Admitting: Family Medicine

## 2019-05-19 ENCOUNTER — Other Ambulatory Visit: Payer: Self-pay

## 2019-05-19 VITALS — BP 130/84 | Ht 66.0 in | Wt 158.0 lb

## 2019-05-19 DIAGNOSIS — E039 Hypothyroidism, unspecified: Secondary | ICD-10-CM

## 2019-05-19 DIAGNOSIS — G43001 Migraine without aura, not intractable, with status migrainosus: Secondary | ICD-10-CM | POA: Diagnosis not present

## 2019-05-19 DIAGNOSIS — I10 Essential (primary) hypertension: Secondary | ICD-10-CM

## 2019-05-19 DIAGNOSIS — J449 Chronic obstructive pulmonary disease, unspecified: Secondary | ICD-10-CM

## 2019-05-19 DIAGNOSIS — F411 Generalized anxiety disorder: Secondary | ICD-10-CM | POA: Diagnosis not present

## 2019-05-19 DIAGNOSIS — Z803 Family history of malignant neoplasm of breast: Secondary | ICD-10-CM

## 2019-05-19 DIAGNOSIS — F321 Major depressive disorder, single episode, moderate: Secondary | ICD-10-CM

## 2019-05-19 DIAGNOSIS — F17200 Nicotine dependence, unspecified, uncomplicated: Secondary | ICD-10-CM

## 2019-05-19 DIAGNOSIS — J3089 Other allergic rhinitis: Secondary | ICD-10-CM

## 2019-05-19 MED ORDER — PREDNISONE 10 MG (21) PO TBPK: ORAL_TABLET | ORAL | 0 refills | Status: DC

## 2019-05-19 NOTE — Telephone Encounter (Addendum)
Spoke with the pt  She is c/o itchy eyes "feel like sand paper", wheezing, increased SOB x 4 days  Worse when goes outside- relates to allergies  She states not coughing  No fevers, chills, body aches   Per Dr Darnell Level- call in Pred pack 10 mg 21 day pack    Rx was sent

## 2019-05-19 NOTE — Progress Notes (Signed)
Telehealth office visit note for Mellody Dance, D.O- at Primary Care at [facility]   I connected with current patient today and verified that I am speaking with the correct person   . Location of the patient: Home . Location of the provider: Office - This visit type was conducted due to national recommendations for restrictions regarding the COVID-19 Pandemic (e.g. social distancing) in an effort to limit this patient's exposure and mitigate transmission in our community.    - No physical exam could be performed with this format, beyond that communicated to [place] by the patient/ family members as noted.   - Additionally my office staff/ schedulers were to discuss with the patient that there may be a monetary charge related to this service, depending on their medical insurance.  My understanding is that patient understood and consented to proceed.     _________________________________________________________________________________   History of Present Illness:  I, Toni Amend, am serving as Education administrator for [employer].  Per patient, doing well on new medications Topamax and verapamil.  Patient continues taking BC powders for her back.  Says "I can't live without my BC powders."  She is not going for her mammogram yearly because "it's like you don't wanna know syndrome."  - Migraines Since the adjustments in her treatment plan, she hasn't been having migraines.  She continues one full tablet of Topamax.  Says "if I don't take it the same time every morning, or if I skip a dose, I can feel it."  Notes "it's awesome."  She is not currently taking Imitrex as she is not experiencing migraines.  - Mood Management, Depression, GAD Patient was referred to psychiatry last OV.  Notes "I had [the appointment] all lined up, and I got in all my paperwork filled to help," but she couldn't go due to complications with her work injury / Copy.  Notes her lawyer is working  on this.  Reports that her current insurance pays for her PCP and her lung doctor, but won't cover new doctors.  Says she still has the appointment set up, and is working on getting back in touch with them as soon as she can figure out her insurance concerns.  Overall, with her current coping mechanisms, feels that her anxiety is under better control.  Notes she still struggles with depression, with her symptoms especially bad last week.  Feels her current treatment plan is working okay for now.  - Hypothyroidism Patient has been taking medication as prescribed prior to recent lab work, but did not realize it was a permanent change.  - Breathing Concerns Says the pollen is "kicking her butt," but feels her breathing is "a lot better since she's changed the medication."  She continues to follow up with pulmonology.  HPI:  Hypertension:  Says since she started the Topamax, "I haven't even had a spike in my blood pressure."  Says she thinks that her high blood pressure was coming from her headaches.  -  Her blood pressure at home has been running: 128, 126, never over 130.  Feels it's "pretty well under control."  - Patient reports good compliance with medication and/or lifestyle modification.  - Her denies acute concerns or problems related to treatment plan  - She denies new onset of: chest pain, exercise intolerance, shortness of breath, dizziness, visual changes, headache, lower extremity swelling or claudication.   Last 3 blood pressure readings in our office are as follows: BP Readings from  Last 3 Encounters:  05/19/19 130/84  04/24/19 120/82  03/19/19 127/79   Filed Weights   05/19/19 1038  Weight: 158 lb (71.7 kg)     GAD 7 : Generalized Anxiety Score 05/19/2019 05/19/2019 03/17/2019 02/03/2019  Nervous, Anxious, on Edge 0 (No Data) 3 3  Control/stop worrying 0 0 3 3  Worry too much - different things 0 0 3 3  Trouble relaxing 0 0 3 1  Restless 0 0 0 1  Easily annoyed  or irritable 0 0 3 3  Afraid - awful might happen 0 0 0 3  Total GAD 7 Score 0 - 15 17  Anxiety Difficulty Not difficult at all Not difficult at all Extremely difficult Somewhat difficult    Depression screen [facility] 2/9 05/19/2019 03/17/2019 02/03/2019 10/08/2018 04/08/2018  Decreased Interest 0 3 1 2  0  Down, Depressed, Hopeless 0 3 3 2 2   PHQ - 2 Score 0 6 4 4 2   Altered sleeping 3 3 3 3 3   Tired, decreased energy 3 3 3 3 3   Change in appetite 3 3 3 3 3   Feeling bad or failure about yourself  0 0 1 2 0  Trouble concentrating 0 0 0 3 2  Moving slowly or fidgety/restless 0 0 0 0 0  Suicidal thoughts 0 0 0 0 0  PHQ-9 Score 9 15 14 18 13   Difficult doing work/chores Not difficult at all Extremely dIfficult Somewhat difficult - -  Some recent data might be hidden      Impression and Recommendations:     1. HTN, goal below 130/80   2. Migraine without aura and with status migrainosus, not intractable   3. Current moderate episode of major depressive disorder (HCC) - not optimal   4. GAD (generalized anxiety disorder)    5. Hypothyroidism, unspecified type   6. COPD-    7. Environmental and seasonal allergies   8. Tobacco use disorder: >30pk yr hx   9. Family history of breast cancer in first degree relative---> daughter- at age 1, 11nd daughter 64       HTN, goal below 130/80 - Last OV, started verapamil in addition to her losartan-HCTZ.  - Blood pressure currently is stable, at goal. - Patient tolerating medications well without S-E. - Patient will continue current treatment regimen.  See med list.  - Counseled patient on pathophysiology of disease and discussed various treatment options, which always includes dietary and lifestyle modification as first line.   - Lifestyle changes such as dash and heart healthy diets and engaging in a regular exercise program discussed extensively with patient.   - Ambulatory blood pressure monitoring encouraged at least 3 times weekly.  Keep  log and bring in every office visit.  Reminded patient that if they ever feel poorly in any way, to check their blood pressure and pulse.  - We will continue to monitor.    GAD, Current Moderate Episode of Major Depressive Disorder - poorly controlled - Last OV, patient was referred to psychiatry. - Per patient, has not followed up with this appointment due to difficulties with insurance coverage.  - Symptoms overall stable on current management. - Continue treatment plan as established.  See med list.  - Will continue to monitor.    Migraine w/out aura and w/ status migrainosus, not intractable - Last OV, began low-dose topamax daily for prevention, alongside verapamil for BP.  - Sable on current management.  Patient tolerating medications well without S-E. - Per patient,  migraines have resolved on Topamax management. - Patient will continue to avoid taking Imitrex, and continue Topamax for chronic prevention.  - Advised patient to take Imitrex ONLY if she experiences a breakthrough migraine.  - Will continue to monitor.    Hypothyroidism - Approaching normal last check; overall no need for change in treatment plan.  - TSH low 3 months ago at 0.06, up to 0.21 one month ago, and up to 0.358 now. - T3, T4, WNL.  - Continue 150 mcg tablet all days of week except for Saturdays. - Take 125 mcg tablet on Saturdays.  - Re-check TSH in 3-4 months. - If TSH is not normalized in 3-4 months upon re-check, patient will take 125 mcg two days per week, such as Saturday and Wednesday.  - Will continue to monitor.    COPD - Tobacco Use Disorder, 30 pack-year history Environmental & Seasonal Allergies - Stable on current treatment plan through pulmonology. - Continue management as established.  See med list.  - Encouraged patient to ask pulmonology regarding additional treatment during increased seasonal allergies / concerns about pollen.  Patient will call pulmonology and ask  about the option of adding Singulair to her treatment plan.  - Patient knows to continue to avoid smoking.  - Will continue to monitor alongside pulmonology.    Family history of breast cancer in first degree relative---> daughter- at age 57, 38nd daughter 36 - Per patient, has not obtained a mammogram in a while. - Ambulatory referral provided for mammogram today.  See orders. - Patient requests referral in [place] because that is where her daughters follow up.  - STRONGLY encouraged patient to obtain mammogram as scheduled and advised.  - Additionally, when appropriate, discussion had with patient regarding our treatment plan, and their biases/concerns about that plan were used in my medical decision making today.    - The patient agreed with the plan and demonstrated an understanding of the instructions.   No barriers to understanding were identified.     - The patient was advised to call back or seek an in-person evaluation if the symptoms worsen or if the condition fails to improve as anticipated.   Return in about 4 months (around 09/18/2019) for f/up for Mood, thyroid, BP, HA etc; will need just TSH next OV.    Orders Placed This Encounter  Procedures  . MM DIGITAL SCREENING BILATERAL    Medications Discontinued During This Encounter  Medication Reason  . albuterol (PROVENTIL HFA;VENTOLIN HFA) 108 (90 Base) MCG/ACT inhaler      Time spent on visit including pre-visit chart review and post-visit care was 21+ minutes.   Note:  This note was prepared with assistance of Dragon voice recognition software. Occasional wrong-word or sound-a-like substitutions may have occurred due to the inherent limitations of voice recognition software.  The [place] was signed into law in 2016 which includes the topic of electronic health records.  This provides immediate access to information in MyChart.  This includes consultation notes, operative notes, office notes, lab  results and pathology reports.  If you have any questions about what you read please let us know at your next visit or call us at the office.  We are right here with you.  This document serves as a record of services personally performed by Mellody Dance, DO. It was created on her behalf by Toni Amend, a trained medical scribe. The creation of this record is based on the scribe's personal observations and the  provider's statements to them.   This case required medical decision making of at least moderate complexity. The above documentation from Toni Amend, medical scribe, has been reviewed by Marjory Sneddon, D.O.     __________________________________________________________________________________     Patient Care Team    Relationship Specialty Notifications Start End  Mellody Dance, DO PCP - General Family Medicine  12/07/15   Marybelle Killings, MD Consulting Physician Orthopedic Surgery  02/28/17    Comment: txs her back- work related injury  Tyler Pita, MD Consulting Physician Pulmonary Disease  02/03/19      -Vitals obtained; medications/ allergies reconciled;  personal medical, social, Sx etc.histories were updated by CMA, reviewed by me and are reflected in chart   Patient Active Problem List   Diagnosis Date Noted  . h/o Excessive drinking alcohol 09/24/2017  . Mammogram declined despite multiple attempts to get patient to go especially since her daughter with premature breast cancer 09/24/2017  . 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 Family history of breast cancer in first degree relative---> daughter- at age 23, 22nd daughter 67 08/17/2016  . Hyperalgesia- skin of feet.  07/17/2016  . Tobacco use disorder: >30pk yr hx 01/03/2016  . Hypothyroidism 12/07/2015  . COPD still smoking  12/07/2015  . Vitamin D deficiency 01/03/2016  . Environmental and seasonal allergies 01/03/2016  . Other fatigue  12/07/2015  . Status post lumbar spinal fusion 06/11/2018  . GERD (gastroesophageal reflux disease) 01/19/2016  . Tobacco abuse counseling 01/03/2016  . Bilateral feet cramps- worse at night 12/11/2015  . Anxiety 12/07/2015  . Depression, recurrent ([place]) 12/07/2015  . Current moderate episode of major depressive disorder ([place]) 03/17/2019  . GAD (generalized anxiety disorder) - poorly controlled 03/17/2019  . Elevated TSH 03/17/2019  . HTN, goal below 130/80 02/03/2019  . History of smoking 30 or more pack years 02/03/2019  . Hyperlipidemia 10/08/2018  . Lumbar stenosis 05/03/2018  . Preop cardiovascular exam 04/22/2018  . Dyslipidemia 04/22/2018  . 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 Shortness of breath on exertion 04/08/2018  . Radiculopathy, lumbar region 03/06/2018  . Chronic back pain greater than 3 months duration 09/24/2017  . Fibrocystic breast changes 07/17/2016  . Erythrocytosis due to hypoxemia 01/03/2016  . History of gastroesophageal reflux (GERD) 01/03/2016  . Migraine 12/07/2015  . Absolute anemia 12/07/2015     Current Meds  Medication Sig  . Aspirin-Salicylamide-Caffeine (BC HEADACHE) 325-95-16 MG TABS Take 1 packet by mouth 3 (three) times daily as needed (headache).   Marland Kitchen atorvastatin (LIPITOR) 40 MG tablet Take 1 tablet (40 mg total) by mouth at bedtime.  . B Complex-C (B-COMPLEX WITH VITAMIN C) tablet Take 1 tablet by mouth daily.  . budesonide (PULMICORT) 0.25 MG/2ML nebulizer solution Take 2 mLs (0.25 mg total) by nebulization 2 (two) times daily. Dx:J44.9  . Cholecalciferol (VITAMIN D3) 5000 units CAPS Take 5,000 Units by mouth daily.   . DULoxetine (CYMBALTA) 20 MG capsule Take 2 capsules (40 mg total) by mouth daily. **PATIENT NEEDS APT FOR FURTHER REFILLS**  . Fluticasone-Umeclidin-Vilant (TRELEGY ELLIPTA) 100-62.5-25 MCG/INH AEPB Inhale 1 puff into the lungs daily. Rinse mouth after use.  . formoterol (PERFOROMIST) 20 MCG/2ML nebulizer solution Take 2  mLs (20 mcg total) by nebulization 2 (two) times daily. Dx:J44.9  . levothyroxine (SYNTHROID) 125 MCG tablet 1 tablet daily on Saturdays only, all other days of the week take the 150 mcg tab  . levothyroxine (SYNTHROID) 150 MCG tablet Take  1 tab daily except for Saturdays when you take the 125 mcg tab  . losartan-hydrochlorothiazide (HYZAAR) 100-25 MG tablet TAKE 1/2 TABLET BY MOUTH DAILY FOR 1 WEEK, ONLY INCREASE TO 1 TABLET DAILY IF BLOOD PRESSURE NOT AT GOAL OF 130/80 **PATIENT NEEDS APT FOR FURTHER REFILLS**  . omeprazole (PRILOSEC) 20 MG capsule Take 1 capsule (20 mg total) by mouth 2 (two) times daily before a meal.  . Revefenacin (YUPELRI) 175 MCG/3ML SOLN Inhale 1 vial into the lungs daily.  . sertraline (ZOLOFT) 100 MG tablet Take 1 tablet (100 mg total) by mouth at bedtime. **PATIENT NEEDS APT FOR FURTHER REFILLS**  . topiramate (TOPAMAX) 50 MG tablet One half tab by mouth daily for 1-2wks then one tab by mouth daily.  . verapamil (CALAN-SR) 120 MG CR tablet Take 1 tablet (120 mg total) by mouth at bedtime.     Allergies:  Allergies  Allergen Reactions  . Iodinated Diagnostic Agents Swelling and Other (See Comments)    Sneezing and eye swelling  . Other Anaphylaxis    Contrast media dye  . Codeine Rash     ROS:  See above HPI for pertinent positives and negatives   Objective:   Blood pressure 130/84, height 5\' 6"  (1.676 m), weight 158 lb (71.7 kg).  (if some vitals are omitted, this means that patient was UNABLE to obtain them even though they were asked to get them prior to OV today.  They were asked to call us at their earliest convenience with these once obtained. ) General: A & O * 3; sounds in no acute distress; in usual state of health.  Skin: Pt confirms warm and dry extremities and pink fingertips HEENT: Pt confirms lips non-cyanotic Chest: Patient confirms normal chest excursion and movement Respiratory: speaking in full sentences, no conversational dyspnea;  patient confirms no use of accessory muscles Psych: insight appears good, mood- appears full

## 2019-05-21 ENCOUNTER — Other Ambulatory Visit: Payer: Self-pay | Admitting: Pulmonary Disease

## 2019-05-23 NOTE — Telephone Encounter (Signed)
Dr. Patsey Berthold, please see pt's mychart message as an FYI.

## 2019-06-02 ENCOUNTER — Other Ambulatory Visit: Payer: Self-pay | Admitting: Family Medicine

## 2019-06-02 DIAGNOSIS — G43001 Migraine without aura, not intractable, with status migrainosus: Secondary | ICD-10-CM

## 2019-06-02 DIAGNOSIS — G8929 Other chronic pain: Secondary | ICD-10-CM

## 2019-06-02 DIAGNOSIS — I10 Essential (primary) hypertension: Secondary | ICD-10-CM

## 2019-06-02 DIAGNOSIS — F411 Generalized anxiety disorder: Secondary | ICD-10-CM

## 2019-06-02 DIAGNOSIS — M549 Dorsalgia, unspecified: Secondary | ICD-10-CM

## 2019-06-02 DIAGNOSIS — E039 Hypothyroidism, unspecified: Secondary | ICD-10-CM

## 2019-06-02 DIAGNOSIS — F321 Major depressive disorder, single episode, moderate: Secondary | ICD-10-CM

## 2019-06-02 NOTE — Telephone Encounter (Signed)
Dr. Patsey Berthold, can we send in a rescue inhaler for pt?

## 2019-06-03 MED ORDER — SERTRALINE HCL 100 MG PO TABS: 100.0000 mg | ORAL_TABLET | Freq: Every day | ORAL | 0 refills | Status: DC

## 2019-06-03 MED ORDER — SUMATRIPTAN SUCCINATE 100 MG PO TABS: ORAL_TABLET | ORAL | 0 refills | Status: DC

## 2019-06-03 MED ORDER — LEVOTHYROXINE SODIUM 150 MCG PO TABS: ORAL_TABLET | ORAL | 0 refills | Status: DC

## 2019-06-03 MED ORDER — LOSARTAN POTASSIUM-HCTZ 100-25 MG PO TABS: ORAL_TABLET | ORAL | 0 refills | Status: AC

## 2019-06-03 MED ORDER — TOPIRAMATE 50 MG PO TABS: 50.0000 mg | ORAL_TABLET | Freq: Every day | ORAL | 0 refills | Status: DC

## 2019-06-03 MED ORDER — DULOXETINE HCL 20 MG PO CPEP: 40.0000 mg | ORAL_CAPSULE | Freq: Every day | ORAL | 0 refills | Status: AC

## 2019-06-05 MED ORDER — ALBUTEROL SULFATE HFA 108 (90 BASE) MCG/ACT IN AERS: 2.0000 | INHALATION_SPRAY | Freq: Four times a day (QID) | RESPIRATORY_TRACT | 2 refills | Status: DC | PRN

## 2019-06-09 ENCOUNTER — Encounter: Payer: Self-pay | Admitting: Family Medicine

## 2019-06-10 ENCOUNTER — Encounter: Payer: Self-pay | Admitting: Family Medicine

## 2019-06-10 ENCOUNTER — Other Ambulatory Visit: Payer: Self-pay | Admitting: Family Medicine

## 2019-06-10 DIAGNOSIS — Z1231 Encounter for screening mammogram for malignant neoplasm of breast: Secondary | ICD-10-CM

## 2019-06-10 DIAGNOSIS — D229 Melanocytic nevi, unspecified: Secondary | ICD-10-CM

## 2019-06-17 LAB — HM MAMMOGRAPHY

## 2019-07-14 ENCOUNTER — Ambulatory Visit: Payer: 59 | Admitting: Pulmonary Disease

## 2019-07-25 ENCOUNTER — Encounter: Payer: Self-pay | Admitting: Pulmonary Disease

## 2019-07-30 ENCOUNTER — Encounter: Payer: Self-pay | Admitting: Physician Assistant

## 2019-08-04 ENCOUNTER — Other Ambulatory Visit: Payer: Self-pay | Admitting: Physician Assistant

## 2019-08-04 ENCOUNTER — Encounter: Payer: Self-pay | Admitting: Physician Assistant

## 2019-08-04 DIAGNOSIS — R928 Other abnormal and inconclusive findings on diagnostic imaging of breast: Secondary | ICD-10-CM

## 2019-08-04 DIAGNOSIS — Z1231 Encounter for screening mammogram for malignant neoplasm of breast: Secondary | ICD-10-CM

## 2019-08-14 LAB — HM MAMMOGRAPHY

## 2019-08-27 ENCOUNTER — Ambulatory Visit: Payer: 59 | Admitting: Pulmonary Disease

## 2019-08-27 ENCOUNTER — Other Ambulatory Visit: Payer: Self-pay

## 2019-08-27 ENCOUNTER — Encounter: Payer: Self-pay | Admitting: Pulmonary Disease

## 2019-08-27 VITALS — BP 128/70 | HR 78 | Temp 97.8°F | Ht 66.0 in | Wt 150.0 lb

## 2019-08-27 DIAGNOSIS — R0602 Shortness of breath: Secondary | ICD-10-CM | POA: Diagnosis not present

## 2019-08-27 DIAGNOSIS — F17210 Nicotine dependence, cigarettes, uncomplicated: Secondary | ICD-10-CM | POA: Diagnosis not present

## 2019-08-27 DIAGNOSIS — J449 Chronic obstructive pulmonary disease, unspecified: Secondary | ICD-10-CM

## 2019-08-27 MED ORDER — TRELEGY ELLIPTA 100-62.5-25 MCG/INH IN AEPB: 1.0000 | INHALATION_SPRAY | Freq: Every day | RESPIRATORY_TRACT | 0 refills | Status: AC

## 2019-08-27 MED ORDER — TRELEGY ELLIPTA 100-62.5-25 MCG/INH IN AEPB: 1.0000 | INHALATION_SPRAY | Freq: Every day | RESPIRATORY_TRACT | 2 refills | Status: AC

## 2019-08-27 NOTE — Patient Instructions (Addendum)
What we discussed today:   We will place an order for a replacement nebulizer machine for you  Once you get your nebulizer machine please resume your nebulizer medications and discontinue Trelegy.  We will see you in follow-up in 3 months time call sooner should any new difficulties arise

## 2019-08-27 NOTE — Progress Notes (Signed)
Subjective:    Patient ID: Kellie Jones, female    DOB: [birth year], 59 y.o.   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  Unfortunately she has relapsed in smoking as of May 15.  Recent visit was on 04/24/2019, she has not had exacerbations since then.  She has however had difficulties with her nebulizer and that her nebulizer is not working anymore.  It is a 59 year old unit.  She resumed use of Trelegy until she can get her nebulizer replaced.  She has some issues with her insurance and has not been able to get her nebulizer replaced.  He has not had any other difficulties but does note increased dyspnea since she has switch back to Trelegy.  He has not had any fevers, chills or sweats.  No change in the character of her cough.  No hemoptysis.  Voices no other complaint.  Review of Systems A 10 point review of systems was performed and it is as noted above otherwise negative.  Social History   Tobacco Use  . Smoking status: Former Smoker    Packs/day: 0.25    Years: 34.00    Pack years: 8.50    Types: Cigarettes    Start date: 07/12/2019  . Smokeless tobacco: Never Used  . Tobacco comment: 1 pack per week  Substance Use Topics  . Alcohol use: Yes    Comment: rarely    Allergies  Allergen Reactions  . Iodinated Diagnostic Agents Swelling and Other (See Comments)    Sneezing and eye swelling  . Other Anaphylaxis    Contrast media dye  . Codeine Rash   Current Meds  Medication Sig  . albuterol (VENTOLIN HFA) 108 (90 Base) MCG/ACT inhaler Inhale 2 puffs into the lungs every 6 (six) hours as needed for wheezing or shortness of breath.  . Aspirin-Salicylamide-Caffeine (BC HEADACHE) 325-95-16 MG TABS Take 1 packet by mouth 3 (three) times daily as needed (headache).   Marland Kitchen atorvastatin (LIPITOR) 40 MG tablet Take 1  tablet (40 mg total) by mouth at bedtime.  . budesonide (PULMICORT) 0.25 MG/2ML nebulizer solution Take 2 mLs (0.25 mg total) by nebulization 2 (two) times daily. Dx:J44.9  . DULoxetine (CYMBALTA) 20 MG capsule Take 2 capsules (40 mg total) by mouth daily.  . Fluticasone-Umeclidin-Vilant (TRELEGY ELLIPTA) 100-62.5-25 MCG/INH AEPB Inhale 1 puff into the lungs daily. Rinse mouth after use.  . formoterol (PERFOROMIST) 20 MCG/2ML nebulizer solution Take 2 mLs (20 mcg total) by nebulization 2 (two) times daily. Dx:J44.9  . levothyroxine (SYNTHROID) 125 MCG tablet 1 tablet daily on Saturdays only, all other days of the week take the 150 mcg tab  . levothyroxine (SYNTHROID) 150 MCG tablet Take 1 tab daily except for Saturdays when you take the 125 mcg tab  . losartan-hydrochlorothiazide (HYZAAR) 100-25 MG tablet TAKE 1/2 TABLET BY MOUTH DAILY FOR 1 WEEK, ONLY INCREASE TO 1 TABLET DAILY IF BLOOD PRESSURE NOT AT GOAL OF 130/80  . omeprazole (PRILOSEC) 20 MG capsule Take 1 capsule (20 mg total) by mouth 2 (two) times daily before a meal.  . Revefenacin (YUPELRI) 175 MCG/3ML SOLN Inhale 1 vial into the lungs daily.  . sertraline (ZOLOFT) 100 MG tablet Take 1 tablet (100 mg total) by mouth at bedtime.  . SUMAtriptan (IMITREX) 100 MG tablet May repeat in 2 hours if  headache persists or recurs.  . topiramate (TOPAMAX) 50 MG tablet Take 1 tablet (50 mg total) by mouth daily. One half tab by mouth daily for 1-2wks then one tab by mouth daily.  . verapamil (CALAN-SR) 120 MG CR tablet Take 1 tablet (120 mg total) by mouth at bedtime.    Immunization History  Administered Date(s) Administered  . Influenza,inj,Quad PF,6+ Mos 02/15/2016, 01/15/2017, 12/14/2017, 11/28/2018  . PFIZER SARS-COV-2 Vaccination 04/28/2019, 05/19/2019  . Pneumococcal Polysaccharide-23 09/15/2015      Objective:   Physical Exam BP 128/70   Pulse 78   Temp 97.8 F (36.6 C)   Ht 5\' 6"  (1.676 m)   Wt 150 lb (68 kg)   SpO2 97%   BMI  24.21 kg/m  GENERAL: Thin well-developed woman in no respiratory distress, pursed lip breathing noted.  Accessory muscle use noted, chronic. HEAD: Normocephalic, atraumatic.  EYES: Pupils equal, round, reactive to light. No scleral icterus.  MOUTH: Nose/mouth/throat not examined due to masking requirements for COVID 19. NECK: Supple. No thyromegaly. No nodules. No JVD.  PULMONARY: Lungs with distant breath sounds bilaterally no wheezes or rhonchi noted. Coarse breath sounds.  Poor air movement in general. CARDIOVASCULAR: S1 and S2. Regular rate and rhythm. No murmurs appreciated GASTROINTESTINAL: Scaphoid abdomen, soft, nondistended MUSCULOSKELETAL: No joint swelling, no clubbing, no edema.  NEUROLOGIC: Awake alert, no gross focal deficits. SKIN: Intact,warm,dry. Limited exam no rashes PSYCH: Mood and behavior are appropriate.      Assessment & Plan:     ICD-10-CM   1. Stage 4 very severe COPD by GOLD classification ([place])  J44.9    She is unable to use her nebulizer medications Needs a replacement nebulizer Patient does not want to submit to insurance Use Trelegy for now  2. Shortness of breath  R06.02    Related to her very severe and advanced COPD Likely also element of cor pulmonale  3. Tobacco dependence due to cigarettes  F17.210    Unfortunately has resumed smoking Counseled with regards to discontinuation of smoking Total counseling time 3 to 5 minutes   Meds ordered this encounter  Medications  . Fluticasone-Umeclidin-Vilant (TRELEGY ELLIPTA) 100-62.5-25 MCG/INH AEPB    Sig: Inhale 1 puff into the lungs daily.    Dispense:  28 each    Refill:  2  . Fluticasone-Umeclidin-Vilant (TRELEGY ELLIPTA) 100-62.5-25 MCG/INH AEPB    Sig: Inhale 1 puff into the lungs daily for 1 day.    Dispense:  14 each    Refill:  0    Order Specific Question:   Lot Number?    Answer:   [id number]    Order Specific Question:   Expiration Date?    Answer:   11/27/2020    Discussion:  Patient has an old nebulizer unit is approximately 59 years old.  She was doing better on belies medications however, her nebulizer unit is not working anymore.  Placement however has some issues with her insurance and does not want [place] to place an order for it.  She states that she will try to work with her insurance and getting a replacement nebulizer.  In the interim she is using Trelegy Ellipta but once she resumes her nebulizer medications she will discontinue Trelegy.  She understood these instructions well.  Up in 3 months time she is to contact us prior to that time should any new difficulties arise.  Renold Don, MD [facility]   *This note was dictated using voice recognition software/Dragon.  Despite best  efforts to proofread, errors can occur which can change the meaning.  Any change was purely unintentional.

## 2019-08-28 ENCOUNTER — Encounter: Payer: Self-pay | Admitting: Pulmonary Disease

## 2019-09-02 ENCOUNTER — Encounter: Payer: Self-pay | Admitting: Physician Assistant

## 2019-09-19 ENCOUNTER — Other Ambulatory Visit: Payer: Self-pay | Admitting: Pulmonary Disease

## 2019-09-23 MED ORDER — ALBUTEROL SULFATE HFA 108 (90 BASE) MCG/ACT IN AERS: 2.0000 | INHALATION_SPRAY | Freq: Four times a day (QID) | RESPIRATORY_TRACT | 2 refills | Status: DC | PRN

## 2019-10-01 ENCOUNTER — Encounter: Payer: Self-pay | Admitting: Physician Assistant

## 2019-10-01 ENCOUNTER — Other Ambulatory Visit: Payer: Self-pay

## 2019-10-01 ENCOUNTER — Ambulatory Visit: Payer: 59 | Admitting: Physician Assistant

## 2019-10-01 VITALS — BP 121/83 | HR 94 | Temp 98.4°F | Ht 66.0 in | Wt 148.4 lb

## 2019-10-01 DIAGNOSIS — E785 Hyperlipidemia, unspecified: Secondary | ICD-10-CM | POA: Diagnosis not present

## 2019-10-01 DIAGNOSIS — F321 Major depressive disorder, single episode, moderate: Secondary | ICD-10-CM

## 2019-10-01 DIAGNOSIS — G43001 Migraine without aura, not intractable, with status migrainosus: Secondary | ICD-10-CM | POA: Diagnosis not present

## 2019-10-01 DIAGNOSIS — E039 Hypothyroidism, unspecified: Secondary | ICD-10-CM | POA: Diagnosis not present

## 2019-10-01 DIAGNOSIS — J449 Chronic obstructive pulmonary disease, unspecified: Secondary | ICD-10-CM

## 2019-10-01 DIAGNOSIS — F411 Generalized anxiety disorder: Secondary | ICD-10-CM

## 2019-10-01 DIAGNOSIS — I10 Essential (primary) hypertension: Secondary | ICD-10-CM

## 2019-10-01 MED ORDER — SUMATRIPTAN SUCCINATE 100 MG PO TABS: ORAL_TABLET | ORAL | 0 refills | Status: AC

## 2019-10-01 MED ORDER — SERTRALINE HCL 100 MG PO TABS: 100.0000 mg | ORAL_TABLET | Freq: Every day | ORAL | 0 refills | Status: DC

## 2019-10-01 MED ORDER — TOPIRAMATE 50 MG PO TABS: 50.0000 mg | ORAL_TABLET | Freq: Every day | ORAL | 0 refills | Status: AC

## 2019-10-01 NOTE — Progress Notes (Signed)
Established Patient Office Visit  Subjective:  Patient ID: Kellie Jones, female    DOB: 05-04-1960  Age: 59 y.o. MRN: [id number]  CC:  Chief Complaint  Patient presents with  . Hypertension  . COPD  . Hypothyroidism    HPI Kellie Jones presents for follow-up on hypertension, hypothyroid, and COPD. Pt has no acute concerns today.  HTN: Pt denies new onset of chest pain, palpitations, dizziness or lower extremity swelling.  Patient reports she is not taking her blood pressure medications because she has been checking her blood pressure at home and it has been good, usually in the 120s/80.  She prefers to take the least amount of medications as possible.  Hypothyroid: Reports compliance with Synthroid 150 mcg.  States she is not taking Synthroid 125 mcg because she usually forgets.  Denies fatigue or bowel changes.  COPD: Followed by pulmonology.  States her breathing is about the same.   Migraines: Reports Topamax really helps to control her migraines.  Sometimes she might miss a dose and usually gets a headache.   Mood: States she self discontinued Cymbalta because she was concerned about taking 2 medications for mood.  Reports compliance with Zoloft which helps with her mood.  Denies SI/HI.  HLD: Patient reports she self discontinued atorvastatin because she has made dietary changes and is more careful about what she eats.  Past Medical History:  Diagnosis Date  . Anxiety   . Arthritis   . Asthma due to seasonal allergies   . Constipation   . COPD (chronic obstructive pulmonary disease) ([place])   . Depression   . Dyslipidemia   . Dyspnea   . History of blood transfusion   . History of kidney stones    several passed  . Hypothyroidism   . Migraine    Migraines  . Neuropathic pain   . Pneumonia    frequent - last time 2015 ish  . PONV (postoperative nausea and vomiting)   . Spinal stenosis     Past Surgical History:  Procedure Laterality Date  . ABDOMINAL  HYSTERECTOMY     total  . APPENDECTOMY    . CHOLECYSTECTOMY    . COLONOSCOPY      Family History  Problem Relation Age of Onset  . Depression Mother   . Cancer Father 54       pancreatic  . Healthy Sister   . Healthy Brother   . Cancer Maternal Aunt 47       breast  . Cancer Maternal Grandmother 29       breast  . Healthy Brother   . CAD Brother 30       CABG  . Healthy Brother     Social History   Socioeconomic History  . Marital status: Significant Other    Spouse name: Not on file  . Number of children: Not on file  . Years of education: Not on file  . Highest education level: Not on file  Occupational History  . Not on file  Tobacco Use  . Smoking status: Former Smoker    Packs/day: 0.25    Years: 34.00    Pack years: 8.50    Types: Cigarettes    Start date: 07/12/2019  . Smokeless tobacco: Never Used  . Tobacco comment: 1 pack per week  Vaping Use  . Vaping Use: Never used  Substance and Sexual Activity  . Alcohol use: Yes    Comment: rarely  . Drug use: No  . Sexual  activity: Never  Other Topics Concern  . Not on file  Social History Narrative   Lives with significant other.  Two children.  One grand.  Two step grands.     Social Determinants of Health   Financial Resource Strain:   . Difficulty of Paying Living Expenses: Not on file  Food Insecurity:   . Worried About Charity fundraiser in the Last Year: Not on file  . Ran Out of Food in the Last Year: Not on file  Transportation Needs:   . Lack of Transportation (Medical): Not on file  . Lack of Transportation (Non-Medical): Not on file  Physical Activity:   . Days of Exercise per Week: Not on file  . Minutes of Exercise per Session: Not on file  Stress:   . Feeling of Stress : Not on file  Social Connections:   . Frequency of Communication with Friends and Family: Not on file  . Frequency of Social Gatherings with Friends and Family: Not on file  . Attends Religious Services: Not on  file  . Active Member of Clubs or Organizations: Not on file  . Attends Archivist Meetings: Not on file  . Marital Status: Not on file  Intimate Partner Violence:   . Fear of Current or Ex-Partner: Not on file  . Emotionally Abused: Not on file  . Physically Abused: Not on file  . Sexually Abused: Not on file    Outpatient Medications Prior to Visit  Medication Sig Dispense Refill  . albuterol (VENTOLIN HFA) 108 (90 Base) MCG/ACT inhaler Inhale 2 puffs into the lungs every 6 (six) hours as needed for wheezing or shortness of breath. 8 g 2  . Aspirin-Salicylamide-Caffeine (BC HEADACHE) 325-95-16 MG TABS Take 1 packet by mouth 3 (three) times daily as needed (headache).     . budesonide (PULMICORT) 0.25 MG/2ML nebulizer solution Take 2 mLs (0.25 mg total) by nebulization 2 (two) times daily. Dx:J44.9 120 mL 5  . Fluticasone-Umeclidin-Vilant (TRELEGY ELLIPTA) 100-62.5-25 MCG/INH AEPB Inhale 1 puff into the lungs daily. Rinse mouth after use. 1 each 10  . Fluticasone-Umeclidin-Vilant (TRELEGY ELLIPTA) 100-62.5-25 MCG/INH AEPB Inhale 1 puff into the lungs daily. 28 each 2  . formoterol (PERFOROMIST) 20 MCG/2ML nebulizer solution Take 2 mLs (20 mcg total) by nebulization 2 (two) times daily. Dx:J44.9 120 mL 5  . omeprazole (PRILOSEC) 20 MG capsule Take 1 capsule (20 mg total) by mouth 2 (two) times daily before a meal. 180 capsule 1  . Revefenacin (YUPELRI) 175 MCG/3ML SOLN Inhale 1 vial into the lungs daily. 120 mL 5  . levothyroxine (SYNTHROID) 150 MCG tablet Take 1 tab daily except for Saturdays when you take the 125 mcg tab 90 tablet 0  . sertraline (ZOLOFT) 100 MG tablet Take 1 tablet (100 mg total) by mouth at bedtime. 90 tablet 0  . SUMAtriptan (IMITREX) 100 MG tablet May repeat in 2 hours if headache persists or recurs. 10 tablet 0  . topiramate (TOPAMAX) 50 MG tablet Take 1 tablet (50 mg total) by mouth daily. One half tab by mouth daily for 1-2wks then one tab by mouth daily.  90 tablet 0  . atorvastatin (LIPITOR) 40 MG tablet Take 1 tablet (40 mg total) by mouth at bedtime. (Patient not taking: Reported on 10/01/2019) 90 tablet 1  . DULoxetine (CYMBALTA) 20 MG capsule Take 2 capsules (40 mg total) by mouth daily. (Patient not taking: Reported on 10/01/2019) 180 capsule 0  . losartan-hydrochlorothiazide (HYZAAR) 100-25 MG tablet  TAKE 1/2 TABLET BY MOUTH DAILY FOR 1 WEEK, ONLY INCREASE TO 1 TABLET DAILY IF BLOOD PRESSURE NOT AT GOAL OF 130/80 (Patient not taking: Reported on 10/01/2019) 90 tablet 0  . verapamil (CALAN-SR) 120 MG CR tablet Take 1 tablet (120 mg total) by mouth at bedtime. (Patient not taking: Reported on 10/01/2019) 90 tablet 0  . levothyroxine (SYNTHROID) 125 MCG tablet 1 tablet daily on Saturdays only, all other days of the week take the 150 mcg tab (Patient not taking: Reported on 10/01/2019) 12 tablet 1   No facility-administered medications prior to visit.    Allergies  Allergen Reactions  . Iodinated Diagnostic Agents Swelling and Other (See Comments)    Sneezing and eye swelling  . Other Anaphylaxis    Contrast media dye  . Codeine Rash    ROS Review of Systems  A fourteen system review of systems was performed and found to be positive as per HPI.  Objective:    Physical Exam General:  Well Developed, well nourished, appropriate for stated age.  Neuro:  Alert and oriented,  extra-ocular muscles intact  HEENT:  Normocephalic, atraumatic, neck supple Skin:  no gross rash, warm, pink. Cardiac:  RRR, S1 S2 Respiratory: Distant breath sounds, poor air movement, no wheezing or rhonchi. Vascular:  Ext warm, no cyanosis apprec.; cap RF less 2 sec. No gross edema Psych:  No HI/SI, judgement and insight good, Euthymic mood. Full Affect.  BP 121/83   Pulse 94   Temp 98.4 F (36.9 C) (Oral)   Ht _0  (1.676 m)   Wt 148 lb 6.4 oz (67.3 kg)   SpO2 94%   BMI 23.95 kg/m  Wt Readings from Last 3 Encounters:  10/01/19 148 lb 6.4 oz (67.3 kg)   08/27/19 150 lb (68 kg)  05/19/19 158 lb (71.7 kg)     Health Maintenance Due  Topic Date Due  . TETANUS/TDAP  Never done  . INFLUENZA VACCINE  09/28/2019    There are no preventive care reminders to display for this patient.  Lab Results  Component Value Date   TSH 0.082 (L) 10/01/2019   Lab Results  Component Value Date   WBC 7.5 02/03/2019   HGB 15.7 02/03/2019   HCT 45.2 02/03/2019   MCV 89 02/03/2019   PLT 276 02/03/2019   Lab Results  Component Value Date   NA 141 10/01/2019   K 4.4 10/01/2019   CO2 22 10/01/2019   GLUCOSE 91 10/01/2019   BUN 11 10/01/2019   CREATININE 0.84 10/01/2019   BILITOT <0.2 10/01/2019   ALKPHOS 103 10/01/2019   AST 15 10/01/2019   ALT 10 10/01/2019   PROT 6.7 10/01/2019   ALBUMIN 4.7 10/01/2019   CALCIUM 9.4 10/01/2019   ANIONGAP 10 05/04/2018   Lab Results  Component Value Date   CHOL 204 (H) 10/01/2019   Lab Results  Component Value Date   HDL 49 10/01/2019   Lab Results  Component Value Date   LDLCALC 141 (H) 10/01/2019   Lab Results  Component Value Date   TRIG 78 10/01/2019   Lab Results  Component Value Date   CHOLHDL 4.2 10/01/2019   Lab Results  Component Value Date   HGBA1C 5.4 03/11/2018      Assessment & Plan:   Problem List Items Addressed This Visit      Cardiovascular and Mediastinum   Migraine (Chronic)   Relevant Medications   topiramate (TOPAMAX) 50 MG tablet   SUMAtriptan (IMITREX) 100 MG  tablet   sertraline (ZOLOFT) 100 MG tablet   HTN, goal below 130/80 - Primary   Relevant Orders   Comp Met (CMET) (Completed)     Endocrine   Hypothyroidism (Chronic)   Relevant Orders   TSH (Completed)   T4, free (Completed)   T3 (Completed)     Other   Current moderate episode of major depressive disorder (HCC) (Chronic)   Relevant Medications   sertraline (ZOLOFT) 100 MG tablet   GAD (generalized anxiety disorder) - poorly controlled (Chronic)   Relevant Medications   sertraline  (ZOLOFT) 100 MG tablet   Hyperlipidemia   Relevant Orders   Lipid Profile (Completed)     HTN: - BP today is stable so reasonable to trial being off medications. - Continue ambulatory BP and pulse monitoring, advised to notify the clinic if BP consistently above 135/85 and will resume antihypertensive regimen. - Follow low-sodium diet - Encourage to stay as active as possible.  Hypothyroid: -Last TSH 0.358, low and free T4 and T3 normal -Patient reports poor compliance with Synthroid regimen, so will recheck thyroid panel today. -Pending lab results will make medication adjustments if needed.  HLD: -Last lipid panel WNL, LDL 63 -Patient self discontinued atorvastatin and is fasting so we will recheck lipid panel today. -Follow a heart healthy diet.  GAD, Current moderate episode of MDD: -Stable, PHQ-9 score of 6 -Continue sertraline 100 mg. -Encouraged to incorporate nonpharmacological therapy such as mindfulness therapy and relaxation techniques. -Will continue to monitor.  Migraine: -Stable. -Continue current medication regimen.  Provided refills.  COPD: -Followed by pulmonology. -Continue current medication regimen.    Meds ordered this encounter  Medications  . topiramate (TOPAMAX) 50 MG tablet    Sig: Take 1 tablet (50 mg total) by mouth daily. One half tab by mouth daily for 1-2wks then one tab by mouth daily.    Dispense:  90 tablet    Refill:  0  . SUMAtriptan (IMITREX) 100 MG tablet    Sig: May repeat in 2 hours if headache persists or recurs.    Dispense:  10 tablet    Refill:  0  . sertraline (ZOLOFT) 100 MG tablet    Sig: Take 1 tablet (100 mg total) by mouth at bedtime.    Dispense:  90 tablet    Refill:  0    Follow-up: Return in about 4 months (around 01/31/2020) for HTN, Mood, Hypothyroid.   Note:  This note was prepared with assistance of Dragon voice recognition software. Occasional wrong-word or sound-a-like substitutions may have occurred due  to the inherent limitations of voice recognition software.   Lorrene Reid, PA-C

## 2019-10-01 NOTE — Patient Instructions (Signed)
DASH Eating Plan DASH stands for "Dietary Approaches to Stop Hypertension." The DASH eating plan is a healthy eating plan that has been shown to reduce high blood pressure (hypertension). It may also reduce your risk for type 2 diabetes, heart disease, and stroke. The DASH eating plan may also help with weight loss. What are tips for following this plan?  General guidelines  Avoid eating more than 2,300 mg (milligrams) of salt (sodium) a day. If you have hypertension, you may need to reduce your sodium intake to 1,500 mg a day.  Limit alcohol intake to no more than 1 drink a day for nonpregnant women and 2 drinks a day for men. One drink equals 12 oz of beer, 5 oz of wine, or 1 oz of hard liquor.  Work with your health care provider to maintain a healthy body weight or to lose weight. Ask what an ideal weight is for you.  Get at least 30 minutes of exercise that causes your heart to beat faster (aerobic exercise) most days of the week. Activities may include walking, swimming, or biking.  Work with your health care provider or diet and nutrition specialist (dietitian) to adjust your eating plan to your individual calorie needs. Reading food labels   Check food labels for the amount of sodium per serving. Choose foods with less than 5 percent of the Daily Value of sodium. Generally, foods with less than 300 mg of sodium per serving fit into this eating plan.  To find whole grains, look for the word "whole" as the first word in the ingredient list. Shopping  Buy products labeled as "low-sodium" or "no salt added."  Buy fresh foods. Avoid canned foods and premade or frozen meals. Cooking  Avoid adding salt when cooking. Use salt-free seasonings or herbs instead of table salt or sea salt. Check with your health care provider or pharmacist before using salt substitutes.  Do not fry foods. Cook foods using healthy methods such as baking, boiling, grilling, and broiling instead.  Cook with  heart-healthy oils, such as olive, canola, soybean, or sunflower oil. Meal planning  Eat a balanced diet that includes: ? 5 or more servings of fruits and vegetables each day. At each meal, try to fill half of your plate with fruits and vegetables. ? Up to 6-8 servings of whole grains each day. ? Less than 6 oz of lean meat, poultry, or fish each day. A 3-oz serving of meat is about the same size as a deck of cards. One egg equals 1 oz. ? 2 servings of low-fat dairy each day. ? A serving of nuts, seeds, or beans 5 times each week. ? Heart-healthy fats. Healthy fats called Omega-3 fatty acids are found in foods such as flaxseeds and coldwater fish, like sardines, salmon, and mackerel.  Limit how much you eat of the following: ? Canned or prepackaged foods. ? Food that is high in trans fat, such as fried foods. ? Food that is high in saturated fat, such as fatty meat. ? Sweets, desserts, sugary drinks, and other foods with added sugar. ? Full-fat dairy products.  Do not salt foods before eating.  Try to eat at least 2 vegetarian meals each week.  Eat more home-cooked food and less restaurant, buffet, and fast food.  When eating at a restaurant, ask that your food be prepared with less salt or no salt, if possible. What foods are recommended? The items listed may not be a complete list. Talk with your dietitian about   what dietary choices are best for you. Grains Whole-grain or whole-wheat bread. Whole-grain or whole-wheat pasta. Brown rice. Oatmeal. Quinoa. Bulgur. Whole-grain and low-sodium cereals. Pita bread. Low-fat, low-sodium crackers. Whole-wheat flour tortillas. Vegetables Fresh or frozen vegetables (raw, steamed, roasted, or grilled). Low-sodium or reduced-sodium tomato and vegetable juice. Low-sodium or reduced-sodium tomato sauce and tomato paste. Low-sodium or reduced-sodium canned vegetables. Fruits All fresh, dried, or frozen fruit. Canned fruit in natural juice (without  added sugar). Meat and other protein foods Skinless chicken or turkey. Ground chicken or turkey. Pork with fat trimmed off. Fish and seafood. Egg whites. Dried beans, peas, or lentils. Unsalted nuts, nut butters, and seeds. Unsalted canned beans. Lean cuts of beef with fat trimmed off. Low-sodium, lean deli meat. Dairy Low-fat (1%) or fat-free (skim) milk. Fat-free, low-fat, or reduced-fat cheeses. Nonfat, low-sodium ricotta or cottage cheese. Low-fat or nonfat yogurt. Low-fat, low-sodium cheese. Fats and oils Soft margarine without trans fats. Vegetable oil. Low-fat, reduced-fat, or light mayonnaise and salad dressings (reduced-sodium). Canola, safflower, olive, soybean, and sunflower oils. Avocado. Seasoning and other foods Herbs. Spices. Seasoning mixes without salt. Unsalted popcorn and pretzels. Fat-free sweets. What foods are not recommended? The items listed may not be a complete list. Talk with your dietitian about what dietary choices are best for you. Grains Baked goods made with fat, such as croissants, muffins, or some breads. Dry pasta or rice meal packs. Vegetables Creamed or fried vegetables. Vegetables in a cheese sauce. Regular canned vegetables (not low-sodium or reduced-sodium). Regular canned tomato sauce and paste (not low-sodium or reduced-sodium). Regular tomato and vegetable juice (not low-sodium or reduced-sodium). Pickles. Olives. Fruits Canned fruit in a light or heavy syrup. Fried fruit. Fruit in cream or butter sauce. Meat and other protein foods Fatty cuts of meat. Ribs. Fried meat. Bacon. Sausage. Bologna and other processed lunch meats. Salami. Fatback. Hotdogs. Bratwurst. Salted nuts and seeds. Canned beans with added salt. Canned or smoked fish. Whole eggs or egg yolks. Chicken or turkey with skin. Dairy Whole or 2% milk, cream, and half-and-half. Whole or full-fat cream cheese. Whole-fat or sweetened yogurt. Full-fat cheese. Nondairy creamers. Whipped toppings.  Processed cheese and cheese spreads. Fats and oils Butter. Stick margarine. Lard. Shortening. Ghee. Bacon fat. Tropical oils, such as coconut, palm kernel, or palm oil. Seasoning and other foods Salted popcorn and pretzels. Onion salt, garlic salt, seasoned salt, table salt, and sea salt. Worcestershire sauce. Tartar sauce. Barbecue sauce. Teriyaki sauce. Soy sauce, including reduced-sodium. Steak sauce. Canned and packaged gravies. Fish sauce. Oyster sauce. Cocktail sauce. Horseradish that you find on the shelf. Ketchup. Mustard. Meat flavorings and tenderizers. Bouillon cubes. Hot sauce and Tabasco sauce. Premade or packaged marinades. Premade or packaged taco seasonings. Relishes. Regular salad dressings. Where to find more information:  National Heart, Lung, and Blood Institute: www.nhlbi.nih.gov  American Heart Association: www.heart.org Summary  The DASH eating plan is a healthy eating plan that has been shown to reduce high blood pressure (hypertension). It may also reduce your risk for type 2 diabetes, heart disease, and stroke.  With the DASH eating plan, you should limit salt (sodium) intake to 2,300 mg a day. If you have hypertension, you may need to reduce your sodium intake to 1,500 mg a day.  When on the DASH eating plan, aim to eat more fresh fruits and vegetables, whole grains, lean proteins, low-fat dairy, and heart-healthy fats.  Work with your health care provider or diet and nutrition specialist (dietitian) to adjust your eating plan to your   individual calorie needs. This information is not intended to replace advice given to you by your health care provider. Make sure you discuss any questions you have with your health care provider. Document Revised: 01/26/2017 Document Reviewed: 02/07/2016 Elsevier Patient Education  2020 [employer]  

## 2019-10-02 ENCOUNTER — Telehealth: Payer: Self-pay | Admitting: Physician Assistant

## 2019-10-02 DIAGNOSIS — E039 Hypothyroidism, unspecified: Secondary | ICD-10-CM

## 2019-10-02 LAB — TSH: TSH: 0.082 u[IU]/mL — ABNORMAL LOW (ref 0.450–4.500)

## 2019-10-02 LAB — COMPREHENSIVE METABOLIC PANEL
ALT: 10 IU/L (ref 0–32)
AST: 15 IU/L (ref 0–40)
Albumin/Globulin Ratio: 2.4 — ABNORMAL HIGH (ref 1.2–2.2)
Albumin: 4.7 g/dL (ref 3.8–4.9)
Alkaline Phosphatase: 103 IU/L (ref 48–121)
BUN/Creatinine Ratio: 13 (ref 9–23)
BUN: 11 mg/dL (ref 6–24)
Bilirubin Total: <0.2 mg/dL (ref 0.0–1.2)
CO2: 22 mmol/L (ref 20–29)
Calcium: 9.4 mg/dL (ref 8.7–10.2)
Chloride: 104 mmol/L (ref 96–106)
Creatinine, Ser: 0.84 mg/dL (ref 0.57–1.00)
GFR calc Af Amer: 89 mL/min/{1.73_m2} (ref >59)
GFR calc non Af Amer: 77 mL/min/{1.73_m2} (ref >59)
Globulin, Total: 2 g/dL (ref 1.5–4.5)
Glucose: 91 mg/dL (ref 65–99)
Potassium: 4.4 mmol/L (ref 3.5–5.2)
Sodium: 141 mmol/L (ref 134–144)
Total Protein: 6.7 g/dL (ref 6.0–8.5)

## 2019-10-02 LAB — LIPID PANEL
Chol/HDL Ratio: 4.2 ratio (ref 0.0–4.4)
Cholesterol, Total: 204 mg/dL — ABNORMAL HIGH (ref 100–199)
HDL: 49 mg/dL (ref >39)
LDL Chol Calc (NIH): 141 mg/dL — ABNORMAL HIGH (ref 0–99)
Triglycerides: 78 mg/dL (ref 0–149)
VLDL Cholesterol Cal: 14 mg/dL (ref 5–40)

## 2019-10-02 LAB — T4, FREE: Free T4: 1.47 ng/dL (ref 0.82–1.77)

## 2019-10-02 LAB — T3: T3, Total: 92 ng/dL (ref 71–180)

## 2019-10-02 MED ORDER — LEVOTHYROXINE SODIUM 137 MCG PO TABS: 137.0000 ug | ORAL_TABLET | Freq: Every day | ORAL | 1 refills | Status: DC

## 2019-10-02 NOTE — Telephone Encounter (Signed)
Sent new rx for levothyroxine 137 mcg. Dose adjustment per most recent labs. Lorrene Reid, PA-C

## 2019-10-03 ENCOUNTER — Encounter: Payer: Self-pay | Admitting: Physician Assistant

## 2019-10-21 ENCOUNTER — Encounter: Payer: Self-pay | Admitting: Physician Assistant

## 2019-10-31 ENCOUNTER — Other Ambulatory Visit: Payer: Self-pay | Admitting: Family Medicine

## 2019-10-31 DIAGNOSIS — E785 Hyperlipidemia, unspecified: Secondary | ICD-10-CM

## 2019-11-05 ENCOUNTER — Encounter: Payer: Self-pay | Admitting: Physician Assistant

## 2019-11-05 DIAGNOSIS — E785 Hyperlipidemia, unspecified: Secondary | ICD-10-CM

## 2019-11-05 MED ORDER — ATORVASTATIN CALCIUM 40 MG PO TABS: 40.0000 mg | ORAL_TABLET | Freq: Every day | ORAL | 0 refills | Status: DC

## 2019-11-18 ENCOUNTER — Ambulatory Visit: Payer: Self-pay

## 2019-11-18 ENCOUNTER — Encounter: Payer: Self-pay | Admitting: Orthopaedic Surgery

## 2019-11-18 ENCOUNTER — Ambulatory Visit (INDEPENDENT_AMBULATORY_CARE_PROVIDER_SITE_OTHER): Payer: Worker's Compensation | Admitting: Orthopaedic Surgery

## 2019-11-18 VITALS — BP 130/82 | HR 82 | Ht 66.0 in | Wt 148.0 lb

## 2019-11-18 DIAGNOSIS — Z981 Arthrodesis status: Secondary | ICD-10-CM

## 2019-11-18 NOTE — Progress Notes (Signed)
Office Visit Note   Patient: Kellie Jones           Date of Birth: [birth year]           MRN: [id number] Visit Date: 11/18/2019              Requested by: Lorrene Reid, PA-C [address] [place],  [place] [address] PCP: Lorrene Reid, PA-C   Assessment & Plan: Visit Diagnoses:  1. Status post lumbar spinal fusion     Plan: We reviewed preop MRI scan preop myelogram CT scan surgery performed 05/03/2018 and postop MRI obtained for second opinion at [facility] in October 2020.  Plain x-rays today shows no change in position and she is neurologically intact without evidence of nerve root tension signs.  She may have had some mild disc bulge with her symptoms.  Conservative treatment recommended no change in the impairment rating she remains at [facility].  Follow-Up Instructions: No follow-ups on file.   Orders:  Orders Placed This Encounter  Procedures  . XR Lumbar Spine 2-3 Views   No orders of the defined types were placed in this encounter.     Procedures: No procedures performed   Clinical Data: No additional findings.   Subjective: Chief Complaint  Patient presents with  . Lower Back - Pain    HPI 59 year old female returns post L4-5 TLIF 05/03/2018 for on-the-job injury back in 2018.  She was rated and released.  She had an MRI scan done at [facility] October last year that shows no adjacent level areas of the significant compression.  Patient states she was cleaning the house Friday before Labor Day woke up Saturday with severe back pain pain that radiated in her buttocks.  She took some BC powder is also some Advil.  On Sunday she was washing dishes felt a pop in her back and had increased pain in her legs.  She still has some balance problems is amatory with a cane.  She states the throbbing seems somewhat constant.  Patient was concerned something from the surgery might have changed.  Review of Systems   Objective: Vital Signs: BP  130/82 (BP Location: Left Arm, Patient Position: Sitting)   Pulse 82   Ht 5\' 6"  (1.676 m)   Wt 148 lb (67.1 kg)   BMI 23.89 kg/m   Physical Exam Constitutional:      Appearance: She is well-developed.  HENT:     Head: Normocephalic.     Right Ear: External ear normal.     Left Ear: External ear normal.  Eyes:     Pupils: Pupils are equal, round, and reactive to light.  Neck:     Thyroid: No thyromegaly.     Trachea: No tracheal deviation.  Cardiovascular:     Rate and Rhythm: Normal rate.  Pulmonary:     Effort: Pulmonary effort is normal.  Abdominal:     Palpations: Abdomen is soft.  Skin:    General: Skin is warm and dry.  Neurological:     Mental Status: She is alert and oriented to person, place, and time.  Psychiatric:        Behavior: Behavior normal.     Ortho Exam Patient still has upper extremity tremor still has some balance problems.  Anterior tib EHL gastrocsoleus is strong lumbar incisions well-healed negative straight leg raising 90 degrees. Specialty Comments:  No specialty comments available.  Imaging: XR Lumbar Spine 2-3 Views  Result Date: 11/18/2019 AP lateral  lumbar spine x-rays are obtained and reviewed this shows instrumented fusion L4-5 with cage bone graft.  Comparison to 09/17/2018 images showed no change in position.  No adjacent level disc space narrowing. Impression: L4-5 fusion, instrumented.  No change from previous images postop.    PMFS History: Patient Active Problem List   Diagnosis Date Noted  . Current moderate episode of major depressive disorder ([place]) 03/17/2019  . GAD (generalized anxiety disorder) - poorly controlled 03/17/2019  . Elevated TSH 03/17/2019  . HTN, goal below 130/80 02/03/2019  . History of smoking 30 or more pack years 02/03/2019  . Hyperlipidemia 10/08/2018  . Status post lumbar spinal fusion 06/11/2018  . Lumbar stenosis 05/03/2018  . Preop cardiovascular exam 04/22/2018  . Dyslipidemia 04/22/2018  .  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 Shortness of breath on exertion 04/08/2018  . Radiculopathy, lumbar region 03/06/2018  . Chronic back pain greater than 3 months duration 09/24/2017  . h/o Excessive drinking alcohol 09/24/2017  . Mammogram declined despite multiple attempts to get patient to go especially since her daughter with premature breast cancer 09/24/2017  . 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 Family history of breast cancer in first degree relative---> daughter- at age 22, 16nd daughter 90 08/17/2016  . Hyperalgesia- skin of feet.  07/17/2016  . Fibrocystic breast changes 07/17/2016  . GERD (gastroesophageal reflux disease) 01/19/2016  . Vitamin D deficiency 01/03/2016  . Tobacco use disorder: >30pk yr hx 01/03/2016  . Tobacco abuse counseling 01/03/2016  . Erythrocytosis due to hypoxemia 01/03/2016  . History of gastroesophageal reflux (GERD) 01/03/2016  . Environmental and seasonal allergies 01/03/2016  . Bilateral feet cramps- worse at night 12/11/2015  . Migraine 12/07/2015  . Hypothyroidism 12/07/2015  . COPD still smoking  12/07/2015  . Anxiety 12/07/2015  . Depression, recurrent ([place]) 12/07/2015  . Other fatigue 12/07/2015  . Absolute anemia 12/07/2015   Past Medical History:  Diagnosis Date  . Anxiety   . Arthritis   . Asthma due to seasonal allergies   . Constipation   . COPD (chronic obstructive pulmonary disease) ([place])   . Depression   . Dyslipidemia   . Dyspnea   . History of blood transfusion   . History of kidney stones    several passed  . Hypothyroidism   . Migraine    Migraines  . Neuropathic pain   . Pneumonia    frequent - last time 2015 ish  . PONV (postoperative nausea and vomiting)   . Spinal stenosis     Family History  Problem Relation Age of Onset  . Depression Mother   . Cancer Father 76       pancreatic  . Healthy Sister   . Healthy Brother   . Cancer Maternal Aunt 46        breast  . Cancer Maternal Grandmother 4       breast  . Healthy Brother   . CAD Brother 75       CABG  . Healthy Brother     Past Surgical History:  Procedure Laterality Date  . ABDOMINAL HYSTERECTOMY     total  . APPENDECTOMY    . CHOLECYSTECTOMY    . COLONOSCOPY     Social History   Occupational History  . Not on file  Tobacco Use  . Smoking status: Former Smoker    Packs/day: 0.25    Years: 34.00    Pack years: 8.50    Types:  Cigarettes    Start date: 07/12/2019  . Smokeless tobacco: Never Used  . Tobacco comment: 1 pack per week  Vaping Use  . Vaping Use: Never used  Substance and Sexual Activity  . Alcohol use: Yes    Comment: rarely  . Drug use: No  . Sexual activity: Never

## 2019-12-12 MED ORDER — ALBUTEROL SULFATE HFA 108 (90 BASE) MCG/ACT IN AERS: 2.0000 | INHALATION_SPRAY | Freq: Four times a day (QID) | RESPIRATORY_TRACT | 2 refills | Status: DC | PRN

## 2019-12-16 ENCOUNTER — Other Ambulatory Visit: Payer: Self-pay | Admitting: Pulmonary Disease

## 2019-12-16 MED ORDER — ALBUTEROL SULFATE HFA 108 (90 BASE) MCG/ACT IN AERS: 2.0000 | INHALATION_SPRAY | Freq: Four times a day (QID) | RESPIRATORY_TRACT | 2 refills | Status: AC | PRN

## 2020-01-05 ENCOUNTER — Other Ambulatory Visit: Payer: Self-pay | Admitting: Physician Assistant

## 2020-01-05 DIAGNOSIS — F321 Major depressive disorder, single episode, moderate: Secondary | ICD-10-CM

## 2020-01-05 DIAGNOSIS — F411 Generalized anxiety disorder: Secondary | ICD-10-CM

## 2020-02-11 ENCOUNTER — Other Ambulatory Visit: Payer: Self-pay | Admitting: Physician Assistant

## 2020-02-11 ENCOUNTER — Telehealth: Payer: Self-pay

## 2020-02-11 DIAGNOSIS — E785 Hyperlipidemia, unspecified: Secondary | ICD-10-CM

## 2020-02-11 NOTE — Telephone Encounter (Signed)
Left vm. 02-11-20

## 2020-02-11 NOTE — Telephone Encounter (Signed)
Please call pt to schedule appt.  No further refills until pt is seen.  T. Chipper Koudelka, CMA  

## 2020-04-16 ENCOUNTER — Telehealth: Payer: Self-pay

## 2020-04-16 ENCOUNTER — Other Ambulatory Visit: Payer: Self-pay | Admitting: Orthopaedic Surgery

## 2020-04-16 DIAGNOSIS — M4316 Spondylolisthesis, lumbar region: Secondary | ICD-10-CM

## 2020-04-16 MED ORDER — PREDNISONE 50 MG PO TABS: ORAL_TABLET | ORAL | 0 refills | Status: DC

## 2020-04-16 NOTE — Telephone Encounter (Signed)
Phone call to patient to review instructions for 13 hr prep for CT Myelogram w/ contrast on 04/22/20  at 9:30 AM. Prescription called into [facility]. Pt aware and verbalized understanding of instructions. Prescription: 2/23 at 8:30 PM- 50mg  Prednisone 2/24 at 2:30 AM- 50mg  Prednisone 2/24 at 8:30 AM - 50mg  Prednisone and 50mg  Benadryl

## 2020-04-16 NOTE — Telephone Encounter (Signed)
Phone call to patient to verify medication list and allergies for myelogram procedure. Pt instructed to hold  imitrex and zoloft    for 48hrs prior to myelogram appointment time and 24 hours after appointment. Pt denied taking cymbalta.  Has dye allergy.  Reaction is sneezing and eye swelling only.  Denied anaphylaxis.  Will call in 13 hour prep after pt is scheduled for myelogram. Pt also instructed to have a driver the day of the procedure, the procedure would take around 2 hours, and discharge instructions discussed. Pt verbalized understanding.

## 2020-04-16 NOTE — Progress Notes (Signed)
Phone call to patient to verify medication list and allergies for myelogram procedure. Pt instructed to hold imitrex and zoloft  for 48hrs prior to myelogram appointment time and 24 hours after appointment. Pt denied taking cymbalta. Pt also instructed to have a driver the day of the procedure, the procedure would take around 2 hours, and discharge instructions discussed. Pt verbalized understanding.

## 2020-04-19 ENCOUNTER — Other Ambulatory Visit: Payer: Self-pay | Admitting: Physician Assistant

## 2020-04-19 DIAGNOSIS — E785 Hyperlipidemia, unspecified: Secondary | ICD-10-CM

## 2020-04-22 ENCOUNTER — Ambulatory Visit
Admission: RE | Admit: 2020-04-22 | Discharge: 2020-04-22 | Disposition: A | Discharge status: Home / Self Care | Payer: Worker's Compensation | Source: Ambulatory Visit | Attending: Orthopaedic Surgery | Admitting: Orthopaedic Surgery

## 2020-04-22 ENCOUNTER — Other Ambulatory Visit: Payer: Self-pay

## 2020-04-22 DIAGNOSIS — M4316 Spondylolisthesis, lumbar region: Secondary | ICD-10-CM

## 2020-04-22 MED ORDER — DIAZEPAM 5 MG PO TABS
10.0000 mg | ORAL_TABLET | Freq: Once | ORAL | Status: AC
  Administered 2020-04-22: 5 mg via ORAL

## 2020-04-22 MED ORDER — IOPAMIDOL (ISOVUE-M 200) INJECTION 41%
20.0000 mL | Freq: Once | INTRAMUSCULAR | Status: AC
  Administered 2020-04-22: 20 mL via INTRATHECAL

## 2020-04-22 NOTE — Discharge Instructions (Signed)
Myelogram Discharge Instructions  1. Go home and rest quietly for the next 24 hours.  It is important to lie flat for the next 24 hours.  Get up only to go to the restroom.  You may lie in the bed or on a couch on your back, your stomach, your left side or your right side.  You may have one pillow under your head.  You may have pillows between your knees while you are on your side or under your knees while you are on your back.  2. DO NOT drive today.  Recline the seat as far back as it will go, while still wearing your seat belt, on the way home.  3. You may get up to go to the bathroom as needed.  You may sit up for 10 minutes to eat.  You may resume your normal diet and medications unless otherwise indicated.  Drink lots of extra fluids today and tomorrow.  4. The incidence of headache, nausea, or vomiting is about 5% (one in 20 patients).  If you develop a headache, lie flat and drink plenty of fluids until the headache goes away.  Caffeinated beverages may be helpful.  If you develop severe nausea and vomiting or a headache that does not go away with flat bed rest, call [phone].  5. You may resume normal activities after your 24 hours of bed rest is over; however, do not exert yourself strongly or do any heavy lifting tomorrow. If when you get up you have a headache when standing, go back to bed and force fluids for another 24 hours.  6. Call your physician for a follow-up appointment.  The results of your myelogram will be sent directly to your physician by the following day.  7. If you have any questions or if complications develop after you arrive home, please call [phone].  Discharge instructions have been explained to the patient.  The patient, or the person responsible for the patient, fully understands these instructions   YOU MAY TAKE YOUR IMITREX AND ZOLOFT TOMORROW ON 04/23/20 @ 9:30AM OR THEREAFTER.

## 2020-04-22 NOTE — Progress Notes (Signed)
Pt reports she has been off of her Imitrex and Zoloft for at least 48 hours. Pt also reports she has taken her 13 hr prep for her allergy to CT contrast.

## 2020-05-05 ENCOUNTER — Telehealth: Payer: Self-pay | Admitting: Pulmonary Disease

## 2020-05-05 ENCOUNTER — Ambulatory Visit: Payer: Self-pay | Admitting: Primary Care

## 2020-05-05 NOTE — Telephone Encounter (Signed)
Called and spoke to patient.  Patient stated that she is going to have back surgery due to loose screws from prior surgery. Surgical clearance is needed.  Sx are baseline. C/o prod cough with green sputum, wheezing and sob with exertion.  Denied fever, chills or sweats. She would like to be cleared ASAP, as she is in a lot of pain.  appt scheduled for 05/06/2020 at 2:00. Patient is willing to travel to [place].  Patient has been provided with address and contact number.   Nothing further needed at this time.

## 2020-05-06 ENCOUNTER — Ambulatory Visit: Payer: Self-pay | Admitting: Adult Health

## 2020-05-13 ENCOUNTER — Encounter: Payer: Self-pay | Admitting: Primary Care

## 2020-05-13 ENCOUNTER — Other Ambulatory Visit: Payer: Self-pay

## 2020-05-13 ENCOUNTER — Ambulatory Visit (INDEPENDENT_AMBULATORY_CARE_PROVIDER_SITE_OTHER): Payer: 59

## 2020-05-13 ENCOUNTER — Ambulatory Visit: Payer: Self-pay | Admitting: Pulmonary Disease

## 2020-05-13 ENCOUNTER — Ambulatory Visit: Payer: 59 | Admitting: Primary Care

## 2020-05-13 VITALS — BP 112/72 | HR 93 | Temp 97.9°F | Ht 66.0 in | Wt 144.8 lb

## 2020-05-13 DIAGNOSIS — J329 Chronic sinusitis, unspecified: Secondary | ICD-10-CM

## 2020-05-13 DIAGNOSIS — J449 Chronic obstructive pulmonary disease, unspecified: Secondary | ICD-10-CM | POA: Diagnosis not present

## 2020-05-13 DIAGNOSIS — J40 Bronchitis, not specified as acute or chronic: Secondary | ICD-10-CM | POA: Diagnosis not present

## 2020-05-13 DIAGNOSIS — Z01811 Encounter for preprocedural respiratory examination: Secondary | ICD-10-CM

## 2020-05-13 MED ORDER — CETIRIZINE HCL 10 MG PO TABS: 10.0000 mg | ORAL_TABLET | Freq: Every day | ORAL | 3 refills | Status: DC

## 2020-05-13 MED ORDER — AZITHROMYCIN 250 MG PO TABS: ORAL_TABLET | ORAL | 0 refills | Status: DC

## 2020-05-13 NOTE — Progress Notes (Signed)
@Patient  ID: Kellie Jones, female    DOB: 19-Sep-1960, 60 y.o.   MRN: [id number]  Chief Complaint  Patient presents with  . Follow-up    Clearance for spinal surgery    Referring provider: Lorrene Reid, PA-C  HPI: 60 year old female, current some day smoker.  Last medical history significant for hypertension, migraine headache, stage IV COPD, GERD, hypothyroidism, lumbar radiculopathy, back pain, depression.  Patient of Dr. Patsey Berthold, last seen on 08/27/2019.  05/13/2020 Patient presents today for preop clearance for spinal surgery with Dr. Patrice Paradise, date TBD.  Procedure will be under general anesthesia.  She is doing fairly ok today. She has not had any recent COPD exacerbations or hospitalizations. She is not on oxygen. She is currently on Trelegy Ellipta 100. She was previously on performist/yupelri nebulizer's but her nebulizer machine broke. She feels Trelegy has helped her get up more mucus.  She is currently dealing with seasonal allergies. She has a lot of nasal drainage and she has a productive cough with green sputum. She takes mucinex-D as needed. Her last spirometry was in October 2019 which showed very severe obstructive lung disease. She has not had recent chest imaging in the last year.     Allergies  Allergen Reactions  . Iodinated Diagnostic Agents Swelling and Other (See Comments)    Sneezing and eye swelling  . Other Anaphylaxis    Contrast media dye Pt denied anaphylaxis; sneezing and eye swelling only resolved with benadryl  . Codeine Rash    Immunization History  Administered Date(s) Administered  . Influenza,inj,Quad PF,6+ Mos 02/15/2016, 01/15/2017, 12/14/2017, 11/28/2018, 11/20/2019  . PFIZER(Purple Top)SARS-COV-2 Vaccination 04/28/2019, 05/19/2019, 11/30/2019  . Pneumococcal Polysaccharide-23 09/15/2015    Past Medical History:  Diagnosis Date  . Anxiety   . Arthritis   . Asthma due to seasonal allergies   . Constipation   . COPD (chronic  obstructive pulmonary disease) ([place])   . Depression   . Dyslipidemia   . Dyspnea   . History of blood transfusion   . History of kidney stones    several passed  . Hypothyroidism   . Migraine    Migraines  . Neuropathic pain   . Pneumonia    frequent - last time 2015 ish  . PONV (postoperative nausea and vomiting)   . Spinal stenosis     Tobacco History: Social History   Tobacco Use  Smoking Status Current Some Day Smoker  . Packs/day: 0.25  . Years: 34.00  . Pack years: 8.50  . Types: Cigarettes  . Start date: 07/12/2019  Smokeless Tobacco Never Used  Tobacco Comment   3 cigarettes per week   Ready to quit: Not Answered Counseling given: Not Answered Comment: 3 cigarettes per week   Outpatient Medications Prior to Visit  Medication Sig Dispense Refill  . albuterol (VENTOLIN HFA) 108 (90 Base) MCG/ACT inhaler Inhale 2 puffs into the lungs every 6 (six) hours as needed for wheezing or shortness of breath. 8 g 2  . Aspirin-Salicylamide-Caffeine 102-58-52 MG TABS Take 1 packet by mouth 3 (three) times daily as needed (headache).     Marland Kitchen atorvastatin (LIPITOR) 40 MG tablet Take 1 tablet (40 mg total) by mouth daily. 90 tablet 0  . Fluticasone-Umeclidin-Vilant (TRELEGY ELLIPTA) 100-62.5-25 MCG/INH AEPB Inhale 1 puff into the lungs daily. Rinse mouth after use. 1 each 10  . Fluticasone-Umeclidin-Vilant (TRELEGY ELLIPTA) 100-62.5-25 MCG/INH AEPB Inhale 1 puff into the lungs daily. 28 each 2  . levothyroxine (SYNTHROID) 112 MCG tablet Take 112  mcg by mouth daily before breakfast.    . omeprazole (PRILOSEC) 20 MG capsule Take 1 capsule (20 mg total) by mouth 2 (two) times daily before a meal. 180 capsule 1  . sertraline (ZOLOFT) 100 MG tablet TAKE 1 TABLET BY MOUTH AT BEDTIME. 90 tablet 0  . SUMAtriptan (IMITREX) 100 MG tablet May repeat in 2 hours if headache persists or recurs. 10 tablet 0  . topiramate (TOPAMAX) 50 MG tablet Take 1 tablet (50 mg total) by mouth daily. One half  tab by mouth daily for 1-2wks then one tab by mouth daily. 90 tablet 0  . budesonide (PULMICORT) 0.25 MG/2ML nebulizer solution Take 2 mLs (0.25 mg total) by nebulization 2 (two) times daily. Dx:J44.9 120 mL 5  . DULoxetine (CYMBALTA) 20 MG capsule Take 2 capsules (40 mg total) by mouth daily. (Patient not taking: No sig reported) 180 capsule 0  . losartan-hydrochlorothiazide (HYZAAR) 100-25 MG tablet TAKE 1/2 TABLET BY MOUTH DAILY FOR 1 WEEK, ONLY INCREASE TO 1 TABLET DAILY IF BLOOD PRESSURE NOT AT GOAL OF 130/80 (Patient not taking: Reported on 05/13/2020) 90 tablet 0  . predniSONE (DELTASONE) 50 MG tablet Pt to take 50 mg of prednisone 04/21/20 at 8:30 PM, 50 mg of prednisone on 04/22/20 2:30 AM, and 50 mg of prednisone on 04/22/20 at 8:30 AM. Pt to also take 50 mg of Benadryl on 04/22/20 at 8:30 AM (Patient not taking: Reported on 05/13/2020) 3 tablet 0  . verapamil (CALAN-SR) 120 MG CR tablet Take 1 tablet (120 mg total) by mouth at bedtime. (Patient not taking: No sig reported) 90 tablet 0  . formoterol (PERFOROMIST) 20 MCG/2ML nebulizer solution Take 2 mLs (20 mcg total) by nebulization 2 (two) times daily. Dx:J44.9 (Patient not taking: No sig reported) 120 mL 5  . Revefenacin (YUPELRI) 175 MCG/3ML SOLN Inhale 1 vial into the lungs daily. (Patient not taking: No sig reported) 120 mL 5   No facility-administered medications prior to visit.    Review of Systems  Review of Systems  Constitutional: Negative.   HENT: Positive for congestion and postnasal drip.   Respiratory: Positive for cough and shortness of breath. Negative for chest tightness and wheezing.   Cardiovascular: Negative.    Physical Exam  BP 112/72 (BP Location: Right Arm, Patient Position: Standing, Cuff Size: Normal)   Pulse 93   Temp 97.9 F (36.6 C) (Skin)   Ht 5\' 6"  (1.676 m)   Wt 144 lb 12.8 oz (65.7 kg)   SpO2 94%   BMI 23.37 kg/m  Physical Exam Constitutional:      Appearance: Normal appearance.  HENT:      Head: Normocephalic and atraumatic.     Nose:     Comments: Audible nasal congestion    Mouth/Throat:     Comments: Voice hoarseness Cardiovascular:     Rate and Rhythm: Normal rate and regular rhythm.  Pulmonary:     Breath sounds: No wheezing, rhonchi or rales.     Comments: Mild dyspnea with speaking Skin:    General: Skin is warm and dry.  Neurological:     General: No focal deficit present.     Mental Status: She is alert and oriented to person, place, and time. Mental status is at baseline.  Psychiatric:        Mood and Affect: Mood normal.        Behavior: Behavior normal.        Thought Content: Thought content normal.  Judgment: Judgment normal.      Lab Results:  CBC    Component Value Date/Time   WBC 7.5 02/03/2019 0931   WBC 12.1 (H) 05/04/2018 0346   RBC 5.07 02/03/2019 0931   RBC 3.40 (L) 05/04/2018 0346   HGB 15.7 02/03/2019 0931   HCT 45.2 02/03/2019 0931   PLT 276 02/03/2019 0931   MCV 89 02/03/2019 0931   MCH 31.0 02/03/2019 0931   MCH 30.3 05/04/2018 0346   MCHC 34.7 02/03/2019 0931   MCHC 32.5 05/04/2018 0346   RDW 12.7 02/03/2019 0931   LYMPHSABS 2.4 02/03/2019 0931   MONOABS 864 12/15/2015 0835   EOSABS 0.1 02/03/2019 0931   BASOSABS 0.1 02/03/2019 0931    BMET    Component Value Date/Time   NA 141 10/01/2019 1100   K 4.4 10/01/2019 1100   CL 104 10/01/2019 1100   CO2 22 10/01/2019 1100   GLUCOSE 91 10/01/2019 1100   GLUCOSE 85 11/29/2018 1021   BUN 11 10/01/2019 1100   CREATININE 0.84 10/01/2019 1100   CREATININE 0.81 11/29/2018 1021   CALCIUM 9.4 10/01/2019 1100   GFRNONAA 77 10/01/2019 1100   GFRNONAA 81 11/29/2018 1021   GFRAA 89 10/01/2019 1100   GFRAA 93 11/29/2018 1021    BNP    Component Value Date/Time   BNP 10.8 04/22/2018 1643    ProBNP No results found for: PROBNP  Imaging: DG Chest 2 View  Result Date: 05/15/2020 CLINICAL DATA:  Preoperative study. EXAM: CHEST - 2 VIEW COMPARISON:  December 11, 2014  FINDINGS: The heart, hila, and mediastinum are normal. No pneumothorax. Continued emphysematous changes. No nodules, masses, or focal infiltrates. Hyperexpansion of the lungs. IMPRESSION: Emphysema.  No other acute abnormalities. Electronically Signed   By: Dorise Bullion III M.D   On: 05/15/2020 09:00   CT LUMBAR SPINE W CONTRAST  Result Date: 04/22/2020 CLINICAL DATA:  Lumbar spondylolisthesis EXAM: LUMBAR MYELOGRAM CT LUMBAR SPINE WITH INTRATHECAL CONTRAST FLUOROSCOPY TIME:  31 seconds; 52 uGym2 DAP TECHNIQUE: The procedure, risks (including but not limited to bleeding, infection, organ damage ), benefits, and alternatives were explained to the patient. Questions regarding the procedure were encouraged and answered. The patient understands and consents to the procedure. An appropriate entry site was determined under fluoroscopy. Operator donned sterile gloves and mask. Skin site was marked, prepped with Betadine, and draped in usual sterile fashion, and infiltrated locally with 1% lidocaine. A 22 gauge spinal needle was advanced into the thecal sac at L3 from a left parasagittal approach. Clear colorless CSF returned. 17 ml Omnipaque 180 were administered intrathecally for lumbar myelography, followed by axial CT scanning of the lumbar spine. I personally performed the lumbar puncture and administered the intrathecal contrast. I also personally supervised acquisition of the myelogram images. Coronal and sagittal reconstructions were generated from the axial scan. COMPARISON:  04/10/2017 FINDINGS: 5 non rib-bearing lumbar segments assigned L1-L5 as before. Negative for fracture. T12-L1: Interspace unremarkable. No spinal or foraminal stenosis. Conus terminates behind L1. L1-2:  Interspace unremarkable.  No spinal or foraminal stenosis. L2-3: Minimal circumferential disc bulge. No spinal or foraminal stenosis. L3-4: Mild circumferential disc bulge. Mild narrowing of the interspace. Mild bilateral facet DJD  with early foraminal encroachment. No spinal stenosis. L4-5: Interval instrumented PLIF. 5 mm anterolisthesis, increased since preop study. Mild lucency around bilateral L5 pedicle screws. No definite solid bone bridging across the graft in the interspace. L5-S1: Interspace unremarkable. Mild bilateral facet DJD. No spinal or foraminal stenosis. Moderate aortoiliac  atheromatous plaque without aneurysm. Cholecystectomy clips. Visualized paraspinal soft tissues otherwise unremarkable. IMPRESSION: 1. Mild disc bulge L3-4 and facet DJD with early foraminal encroachment. 2. Mild facet DJD L5-S1 without compressive pathology. 3. Interval instrumented PLIF L4-5 with progressive anterolisthesis and lucency around bilateral L5 pedicle screws. No definite solid bone bridging across the graft in the interspace. Aortic Atherosclerosis (ICD10-I70.0). Electronically Signed   By: Lucrezia Europe M.D.   On: 04/22/2020 11:26   DG MYELOGRAPHY LUMBAR INJ LUMBOSACRAL  Result Date: 04/22/2020 CLINICAL DATA:  Lumbar spondylolisthesis EXAM: LUMBAR MYELOGRAM CT LUMBAR SPINE WITH INTRATHECAL CONTRAST FLUOROSCOPY TIME:  31 seconds; 48 uGym2 DAP TECHNIQUE: The procedure, risks (including but not limited to bleeding, infection, organ damage ), benefits, and alternatives were explained to the patient. Questions regarding the procedure were encouraged and answered. The patient understands and consents to the procedure. An appropriate entry site was determined under fluoroscopy. Operator donned sterile gloves and mask. Skin site was marked, prepped with Betadine, and draped in usual sterile fashion, and infiltrated locally with 1% lidocaine. A 22 gauge spinal needle was advanced into the thecal sac at L3 from a left parasagittal approach. Clear colorless CSF returned. 17 ml Omnipaque 180 were administered intrathecally for lumbar myelography, followed by axial CT scanning of the lumbar spine. I personally performed the lumbar puncture and  administered the intrathecal contrast. I also personally supervised acquisition of the myelogram images. Coronal and sagittal reconstructions were generated from the axial scan. COMPARISON:  04/10/2017 FINDINGS: 5 non rib-bearing lumbar segments assigned L1-L5 as before. Negative for fracture. T12-L1: Interspace unremarkable. No spinal or foraminal stenosis. Conus terminates behind L1. L1-2:  Interspace unremarkable.  No spinal or foraminal stenosis. L2-3: Minimal circumferential disc bulge. No spinal or foraminal stenosis. L3-4: Mild circumferential disc bulge. Mild narrowing of the interspace. Mild bilateral facet DJD with early foraminal encroachment. No spinal stenosis. L4-5: Interval instrumented PLIF. 5 mm anterolisthesis, increased since preop study. Mild lucency around bilateral L5 pedicle screws. No definite solid bone bridging across the graft in the interspace. L5-S1: Interspace unremarkable. Mild bilateral facet DJD. No spinal or foraminal stenosis. Moderate aortoiliac atheromatous plaque without aneurysm. Cholecystectomy clips. Visualized paraspinal soft tissues otherwise unremarkable. IMPRESSION: 1. Mild disc bulge L3-4 and facet DJD with early foraminal encroachment. 2. Mild facet DJD L5-S1 without compressive pathology. 3. Interval instrumented PLIF L4-5 with progressive anterolisthesis and lucency around bilateral L5 pedicle screws. No definite solid bone bridging across the graft in the interspace. Aortic Atherosclerosis (ICD10-I70.0). Electronically Signed   By: Lucrezia Europe M.D.   On: 04/22/2020 11:26     Assessment & Plan:   COPD still smoking  - Severe obstructive lung disease, 2019 FEV1 1.14 (43%). Patient becomes easily winded while speaking. She currently has a productive cough with purulent mucus. Lungs were mostly clear on exam, no audible wheezing/rhonchi. O2 94% RA. Continue Trelegy 120mcg one puff daily and PRN Ventolin HFA  Sinobronchitis - Likely exacerbated by seasonal  allergies. Recommend starting Cetirizine 10mg  daily and continuing nasal saline rinses. Sending in [place] for [place]. Checking CXR   Preop pulmonary/respiratory exam - Patient will be considered high risk for surgery. She needs updated PFTs and CXR prior to clearance. We recommend pulmonary consult post-op, she may need prolonged ventilation.   Recommend 1. Short duration of surgery as much as possible and avoid paralytic if possible 2. Recovery in step down or ICU with Pulmonary consultation 3. DVT prophylaxis 4. Aggressive pulmonary toilet with o2, bronchodilatation, and incentive spirometry and early  ambulation     Martyn Ehrich, NP 05/17/2020

## 2020-05-13 NOTE — Patient Instructions (Addendum)
Recommendations: Continue Trelegy 100 one puff daily in the morning (rinse mouth after use) Start Zyrtec 10mg  daily for allergies  Continue saline nasal spray twice a day  Treating you for acute sinusitis with azithromycin Checking CXR and spirometry pre-op  Orders: Spirometry CXR today   Rx: Zpack  Follow-up: 2 weeks with Beth NP in [place] or Dr. Darnell Level in [place]

## 2020-05-17 DIAGNOSIS — J329 Chronic sinusitis, unspecified: Secondary | ICD-10-CM | POA: Insufficient documentation

## 2020-05-17 DIAGNOSIS — J40 Bronchitis, not specified as acute or chronic: Secondary | ICD-10-CM | POA: Insufficient documentation

## 2020-05-17 DIAGNOSIS — Z01811 Encounter for preprocedural respiratory examination: Secondary | ICD-10-CM | POA: Insufficient documentation

## 2020-05-17 NOTE — Assessment & Plan Note (Addendum)
-   Likely exacerbated by seasonal allergies. Recommend starting Cetirizine 10mg  daily and continuing nasal saline rinses. Sending in [place] for [place]. Checking CXR

## 2020-05-17 NOTE — Assessment & Plan Note (Addendum)
-   Severe obstructive lung disease, 2019 FEV1 1.14 (43%). Patient becomes easily winded while speaking. She currently has a productive cough with purulent mucus. Lungs were mostly clear on exam, no audible wheezing/rhonchi. O2 94% RA. Continue Trelegy 12mcg one puff daily and PRN Ventolin HFA

## 2020-05-17 NOTE — Assessment & Plan Note (Addendum)
-   Patient will be considered high risk for surgery. She needs updated PFTs and CXR prior to clearance. We recommend pulmonary consult post-op, she may need prolonged ventilation.   Recommend 1. Short duration of surgery as much as possible and avoid paralytic if possible 2. Recovery in step down or ICU with Pulmonary consultation 3. DVT prophylaxis 4. Aggressive pulmonary toilet with o2, bronchodilatation, and incentive spirometry and early ambulation

## 2020-05-18 NOTE — Progress Notes (Signed)
Agree with the details of the visit as noted by Geraldo Pitter, NP.  Patient is high risk for the proposed procedure and this risk cannot be further reduced.  I agree with the recommendations as noted.  Renold Don, MD [facility]

## 2020-05-27 ENCOUNTER — Ambulatory Visit: Payer: 59 | Admitting: Primary Care

## 2020-05-31 ENCOUNTER — Ambulatory Visit: Payer: 59 | Admitting: Adult Health

## 2020-05-31 ENCOUNTER — Other Ambulatory Visit: Payer: Self-pay

## 2020-05-31 ENCOUNTER — Ambulatory Visit: Payer: 59 | Admitting: Primary Care

## 2020-05-31 ENCOUNTER — Encounter: Payer: Self-pay | Admitting: Adult Health

## 2020-05-31 ENCOUNTER — Ambulatory Visit (INDEPENDENT_AMBULATORY_CARE_PROVIDER_SITE_OTHER): Payer: 59 | Admitting: Internal Medicine

## 2020-05-31 DIAGNOSIS — J449 Chronic obstructive pulmonary disease, unspecified: Secondary | ICD-10-CM | POA: Diagnosis not present

## 2020-05-31 DIAGNOSIS — Z01811 Encounter for preprocedural respiratory examination: Secondary | ICD-10-CM

## 2020-05-31 DIAGNOSIS — F17200 Nicotine dependence, unspecified, uncomplicated: Secondary | ICD-10-CM | POA: Diagnosis not present

## 2020-05-31 LAB — PULMONARY FUNCTION TEST
DL/VA % pred: 50 %
DL/VA: 2.12 ml/min/mmHg/L
DLCO cor % pred: 47 %
DLCO cor: 10.26 ml/min/mmHg
DLCO unc % pred: 47 %
DLCO unc: 10.26 ml/min/mmHg
FEF 25-75 Post: 0.42 L/sec
FEF 25-75 Pre: 0.45 L/sec
FEF2575-%Change-Post: -5 %
FEF2575-%Pred-Post: 16 %
FEF2575-%Pred-Pre: 17 %
FEV1-%Change-Post: -4 %
FEV1-%Pred-Post: 38 %
FEV1-%Pred-Pre: 39 %
FEV1-Post: 1.06 L
FEV1-Pre: 1.11 L
FEV1FVC-%Change-Post: 2 %
FEV1FVC-%Pred-Pre: 56 %
FEV6-%Change-Post: -2 %
FEV6-%Pred-Post: 67 %
FEV6-%Pred-Pre: 69 %
FEV6-Post: 2.34 L
FEV6-Pre: 2.4 L
FEV6FVC-%Change-Post: 4 %
FEV6FVC-%Pred-Post: 103 %
FEV6FVC-%Pred-Pre: 98 %
FVC-%Change-Post: -6 %
FVC-%Pred-Post: 65 %
FVC-%Pred-Pre: 70 %
FVC-Post: 2.34 L
FVC-Pre: 2.51 L
Post FEV1/FVC ratio: 45 %
Post FEV6/FVC ratio: 100 %
Pre FEV1/FVC ratio: 44 %
Pre FEV6/FVC Ratio: 96 %
RV % pred: 171 %
RV: 3.55 L
TLC % pred: 125 %
TLC: 6.71 L

## 2020-05-31 NOTE — Assessment & Plan Note (Signed)
Pulmonary preop risk assessment. Patient is fully independent, not on oxygen.  COPD is well controlled.  Lung function is stable but has severe airflow obstruction. She is at a moderate surgical risk.  Patient education given on potential pulmonary surgical risk.  Major Pulmonary risks identified in the multifactorial risk analysis are but not limited to a) pneumonia; b) recurrent intubation risk; c) prolonged or recurrent acute respiratory failure needing mechanical ventilation; d) prolonged hospitalization; e) DVT/Pulmonary embolism; f) Acute Pulmonary edema  Recommend 1. Short duration of surgery as much as possible and avoid paralytic if possible 2. Recovery in step down or ICU with Pulmonary consultation if needed 3. DVT prophylaxis as indicated.  4. Aggressive pulmonary toilet with o2, bronchodilatation, and incentive spirometry and early ambulation     1) RISK FOR PROLONGED MECHANICAL VENTILAION - > 48h  1A) [place] - Prolonged mech ventilation risk Arozullah Postperative Pulmonary Risk Score - for mech ventilation dependence >48h [employer], [facility] 2000, major non-cardiac surgery) Comment Score  Type of surgery - abd ao aneurysm (27), thoracic (21), neurosurgery / upper abdominal / vascular (21), neck (11) Back  15  Emergency Surgery - (11)  0  ALbumin < 3 or poor nutritional state - (9)  0  BUN > 30 -  (8)  0  Partial or completely dependent functional status - (7)  0  COPD -  (6)  6  Age - 60 to 69 (4), > 70  (6)  0  TOTAL  21  Risk Stratifcation scores  - < 10 (0.5%), 11-19 (1.8%), 20-27 (4.2%), 28-40 (10.1%), >40 (26.6%)

## 2020-05-31 NOTE — Patient Instructions (Signed)
Pulmonary function test performed today. 

## 2020-05-31 NOTE — Assessment & Plan Note (Signed)
COPD stable on current regimen.  Pulmonary function testing so is only a slight decrease in lung function. Continue on triple therapy maintenance inhaler.  Smoking cessation was encouraged Patient remains fully independent.  Is not on oxygen.  Plan  Patient Instructions  Continue on TRELEGY inhaler 1 puff daily , rinse after use.  Albuterol inhaler As needed   Activity as tolerated.  Work on not smoking  No smoking x 1 week prior to surgery .  Follow up with Kellie Jones in 3-4 months and As needed   Good luck with upcoming surgery .

## 2020-05-31 NOTE — Progress Notes (Signed)
Full PFT performed today. °

## 2020-05-31 NOTE — Progress Notes (Signed)
@Patient  ID: Kellie Jones, female    DOB: 1960-09-15, 60 y.o.   MRN: [id number]  Chief Complaint  Patient presents with  . Follow-up    Referring provider: Ellouise Newer  HPI: 60 year old female active smoker followed for severe COPD   TEST/EVENTS :   05/31/2020 Follow up : COPD and PreOp pulmonary risk assessment Patient presents for a 2-week follow-up.  Patient has underlying severe COPD.  She is an active smoker.  She remains on Trelegy inhaler daily.  Says overall her breathing is doing well.  She has no flare of cough or wheezing. Denies any dyspnea at rest. If heavy activity gets winded. If not for back pain feels she could be more .  Lives at home . 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 Drives.  Activity level is low due to ongoing back pain . Walks with cane due to back pain . Back pain is limiting activity . Not on oxygen . O2 sats 97% on room air.  Pulmonary function testing today shows severe airflow obstruction with FEV1 at 39%, ratio 44, FVC 70%, no significant bronchodilator response, DLCO 47%. Chest x-ray May 15, 2020 showed emphysematous changes without acute process noted. Patient has upcoming surgery for redo-spinal fusion . Has had difficulties since surgery in 2020 with ongoing pain . Dr. Patrice Paradise is planning this in the near future.  Had no  known breathing difficulties with first surgery in 2020  . Previous Surgical h/p Isla Pence and Anesthesia notes reviewed.   Covid vaccines up to date including booster.    Allergies  Allergen Reactions  . Iodinated Diagnostic Agents Swelling and Other (See Comments)    Sneezing and eye swelling  . Other Anaphylaxis    Contrast media dye Pt denied anaphylaxis; sneezing and eye swelling only resolved with benadryl  . Codeine Rash    Immunization History  Administered Date(s) Administered  . Influenza,inj,Quad PF,6+ Mos 02/15/2016, 01/15/2017, 12/14/2017, 11/28/2018, 11/20/2019  . PFIZER(Purple  Top)SARS-COV-2 Vaccination 04/28/2019, 05/19/2019, 11/30/2019  . Pneumococcal Polysaccharide-23 09/15/2015    Past Medical History:  Diagnosis Date  . Anxiety   . Arthritis   . Asthma due to seasonal allergies   . Constipation   . COPD (chronic obstructive pulmonary disease) ([place])   . Depression   . Dyslipidemia   . Dyspnea   . History of blood transfusion   . History of kidney stones    several passed  . Hypothyroidism   . Migraine    Migraines  . Neuropathic pain   . Pneumonia    frequent - last time 2015 ish  . PONV (postoperative nausea and vomiting)   . Spinal stenosis     Tobacco History: Social History   Tobacco Use  Smoking Status Current Some Day Smoker  . Packs/day: 0.25  . Years: 34.00  . Pack years: 8.50  . Types: Cigarettes  . Start date: 07/12/2019  Smokeless Tobacco Never Used  Tobacco Comment   3 cigarettes per week   Ready to quit: No Counseling given: Yes Comment: 3 cigarettes per week   Outpatient Medications Prior to Visit  Medication Sig Dispense Refill  . albuterol (VENTOLIN HFA) 108 (90 Base) MCG/ACT inhaler Inhale 2 puffs into the lungs every 6 (six) hours as needed for wheezing or shortness of breath. 8 g 2  . Aspirin-Salicylamide-Caffeine 130-86-57 MG TABS Take 1 packet by mouth 3 (three) times daily as needed (headache).     Marland Kitchen atorvastatin (LIPITOR) 40 MG tablet Take  1 tablet (40 mg total) by mouth daily. 90 tablet 0  . cetirizine (ZYRTEC ALLERGY) 10 MG tablet Take 1 tablet (10 mg total) by mouth daily. 30 tablet 3  . Fluticasone-Umeclidin-Vilant (TRELEGY ELLIPTA) 100-62.5-25 MCG/INH AEPB Inhale 1 puff into the lungs daily. Rinse mouth after use. 1 each 10  . Fluticasone-Umeclidin-Vilant (TRELEGY ELLIPTA) 100-62.5-25 MCG/INH AEPB Inhale 1 puff into the lungs daily. 28 each 2  . levothyroxine (SYNTHROID) 112 MCG tablet Take 112 mcg by mouth daily before breakfast.    . omeprazole (PRILOSEC) 20 MG capsule Take 1 capsule (20 mg total) by  mouth 2 (two) times daily before a meal. 180 capsule 1  . sertraline (ZOLOFT) 100 MG tablet TAKE 1 TABLET BY MOUTH AT BEDTIME. 90 tablet 0  . SUMAtriptan (IMITREX) 100 MG tablet May repeat in 2 hours if headache persists or recurs. 10 tablet 0  . topiramate (TOPAMAX) 50 MG tablet Take 1 tablet (50 mg total) by mouth daily. One half tab by mouth daily for 1-2wks then one tab by mouth daily. 90 tablet 0  . budesonide (PULMICORT) 0.25 MG/2ML nebulizer solution Take 2 mLs (0.25 mg total) by nebulization 2 (two) times daily. Dx:J44.9 120 mL 5  . DULoxetine (CYMBALTA) 20 MG capsule Take 2 capsules (40 mg total) by mouth daily. (Patient not taking: No sig reported) 180 capsule 0  . losartan-hydrochlorothiazide (HYZAAR) 100-25 MG tablet TAKE 1/2 TABLET BY MOUTH DAILY FOR 1 WEEK, ONLY INCREASE TO 1 TABLET DAILY IF BLOOD PRESSURE NOT AT GOAL OF 130/80 (Patient not taking: No sig reported) 90 tablet 0  . verapamil (CALAN-SR) 120 MG CR tablet Take 1 tablet (120 mg total) by mouth at bedtime. (Patient not taking: No sig reported) 90 tablet 0  . azithromycin (ZITHROMAX) 250 MG tablet Zpack taper as directed 6 tablet 0  . predniSONE (DELTASONE) 50 MG tablet Pt to take 50 mg of prednisone 04/21/20 at 8:30 PM, 50 mg of prednisone on 04/22/20 2:30 AM, and 50 mg of prednisone on 04/22/20 at 8:30 AM. Pt to also take 50 mg of Benadryl on 04/22/20 at 8:30 AM (Patient not taking: Reported on 05/13/2020) 3 tablet 0   No facility-administered medications prior to visit.     Review of Systems:   Constitutional:   No  weight loss, night sweats,  Fevers, chills, + fatigue, or  lassitude.  HEENT:   No headaches,  Difficulty swallowing,  Tooth/dental problems, or  Sore throat,                No sneezing, itching, ear ache, nasal congestion, post nasal drip,   CV:  No chest pain,  Orthopnea, PND, swelling in lower extremities, anasarca, dizziness, palpitations, syncope.   GI  No heartburn, indigestion, abdominal pain, nausea,  vomiting, diarrhea, change in bowel habits, loss of appetite, bloody stools.   Resp:   No chest wall deformity  Skin: no rash or lesions.  GU: no dysuria, change in color of urine, no urgency or frequency.  No flank pain, no hematuria   MS:  No joint pain or swelling.  No decreased range of motion.  + back pain.    Physical Exam  BP 108/66 (BP Location: Left Arm, Cuff Size: Normal)   Pulse 86   Temp (!) 97.5 F (36.4 C) (Temporal)   Ht 5\' 6"  (1.676 m)   Wt 141 lb (64 kg)   SpO2 97% Comment: RA  BMI 22.76 kg/m   GEN: A/Ox3; pleasant , NAD, well nourished  HEENT:  [place]/AT,   NOSE-clear, THROAT-clear, no lesions, no postnasal drip or exudate noted.   NECK:  Supple w/ fair ROM; no JVD; normal carotid impulses w/o bruits; no thyromegaly or nodules palpated; no lymphadenopathy.    RESP  Clear  P & A; w/o, wheezes/ rales/ or rhonchi. no accessory muscle use, no dullness to percussion  CARD:  RRR, no m/r/g, no peripheral edema, pulses intact, no cyanosis or clubbing.  GI:   Soft & nt; nml bowel sounds; no organomegaly or masses detected.   Musco: Warm bil, no deformities or joint swelling noted.   Neuro: alert, no focal deficits noted.    Skin: Warm, no lesions or rashes    Lab Results:  CBC   ProBNP No results found for: PROBNP  Imaging: DG Chest 2 View  Result Date: 05/15/2020 CLINICAL DATA:  Preoperative study. EXAM: CHEST - 2 VIEW COMPARISON:  December 11, 2014 FINDINGS: The heart, hila, and mediastinum are normal. No pneumothorax. Continued emphysematous changes. No nodules, masses, or focal infiltrates. Hyperexpansion of the lungs. IMPRESSION: Emphysema.  No other acute abnormalities. Electronically Signed   By: Dorise Bullion III M.D   On: 05/15/2020 09:00      PFT Results Latest Ref Rng & Units 05/31/2020  FVC-Pre L 2.51  FVC-Predicted Pre % 70  FVC-Post L 2.34  FVC-Predicted Post % 65  Pre FEV1/FVC % % 44  Post FEV1/FCV % % 45  FEV1-Pre L 1.11   FEV1-Predicted Pre % 39  FEV1-Post L 1.06  DLCO uncorrected ml/min/mmHg 10.26  DLCO UNC% % 47  DLCO corrected ml/min/mmHg 10.26  DLCO COR %Predicted % 47  DLVA Predicted % 50  TLC L 6.71  TLC % Predicted % 125  RV % Predicted % 171    No results found for: NITRICOXIDE      Assessment & Plan:   COPD still smoking  COPD stable on current regimen.  Pulmonary function testing so is only a slight decrease in lung function. Continue on triple therapy maintenance inhaler.  Smoking cessation was encouraged Patient remains fully independent.  Is not on oxygen.  Plan  Patient Instructions  Continue on TRELEGY inhaler 1 puff daily , rinse after use.  Albuterol inhaler As needed   Activity as tolerated.  Work on not smoking  No smoking x 1 week prior to surgery .  Follow up with Dr. Patsey Berthold in 3-4 months and As needed   Good luck with upcoming surgery .       Tobacco use disorder: >30pk yr hx Encouraged on smoking cessation.  Preop pulmonary/respiratory exam Pulmonary preop risk assessment. Patient is fully independent, not on oxygen.  COPD is well controlled.  Lung function is stable but has severe airflow obstruction. She is at a moderate surgical risk.  Patient education given on potential pulmonary surgical risk.  Major Pulmonary risks identified in the multifactorial risk analysis are but not limited to a) pneumonia; b) recurrent intubation risk; c) prolonged or recurrent acute respiratory failure needing mechanical ventilation; d) prolonged hospitalization; e) DVT/Pulmonary embolism; f) Acute Pulmonary edema  Recommend 1. Short duration of surgery as much as possible and avoid paralytic if possible 2. Recovery in step down or ICU with Pulmonary consultation if needed 3. DVT prophylaxis as indicated.  4. Aggressive pulmonary toilet with o2, bronchodilatation, and incentive spirometry and early ambulation     1) RISK FOR PROLONGED MECHANICAL VENTILAION - >  48h  1A) Arozullah - Prolonged mech ventilation risk Arozullah Postperative Pulmonary Risk  Score - for mech ventilation dependence >48h [employer], [place] [address], major non-cardiac surgery) Comment Score  Type of surgery - abd ao aneurysm (27), thoracic (21), neurosurgery / upper abdominal / vascular (21), neck (11) Back  15  Emergency Surgery - (11)  0  ALbumin < 3 or poor nutritional state - (9)  0  BUN > 30 -  (8)  0  Partial or completely dependent functional status - (7)  0  COPD -  (6)  6  Age - 60 to 69 (4), > 70  (6)  0  TOTAL  21  Risk Stratifcation scores  - < 10 (0.5%), 11-19 (1.8%), 20-27 (4.2%), 28-40 (10.1%), >40 (26.6%)             Kellie Butner, NP 05/31/2020

## 2020-05-31 NOTE — Assessment & Plan Note (Signed)
Encouraged on smoking cessation 

## 2020-05-31 NOTE — Patient Instructions (Signed)
Continue on TRELEGY inhaler 1 puff daily , rinse after use.  Albuterol inhaler As needed   Activity as tolerated.  Work on not smoking  No smoking x 1 week prior to surgery .  Follow up with Dr. Patsey Berthold in 3-4 months and As needed   Good luck with upcoming surgery .

## 2020-06-08 ENCOUNTER — Ambulatory Visit: Payer: Self-pay | Admitting: Primary Care

## 2020-06-08 NOTE — Progress Notes (Signed)
Agree with the details of the visit as noted by Tammy Parrett, NP.  C. Laura Grier Czerwinski, MD [facility] 

## 2020-06-08 NOTE — Progress Notes (Signed)
Agree with the details of the visit as noted by Tammy Parrett, NP.  C. Laura Eivin Mascio, MD [facility] 

## 2020-06-28 MED ORDER — NICOTINE 21 MG/24HR TD PT24: 21.0000 mg | MEDICATED_PATCH | Freq: Every day | TRANSDERMAL | 0 refills | Status: AC

## 2020-06-28 NOTE — Telephone Encounter (Signed)
Yes that is fine but most are otc .

## 2020-06-28 NOTE — Telephone Encounter (Signed)
Tammy, Please advise if ok to send in script for nicotine patches for this patient.  She has an upcoming back surgery on June 13 th and would like to quit prior to then.  Thank you.

## 2020-06-30 NOTE — Telephone Encounter (Signed)
TP please advise- pt is also asking for Chantix rx. Ok to send? Thanks!

## 2020-06-30 NOTE — Telephone Encounter (Signed)
Chantix is off the market and unfortunately would not be able to be prescribed.  We will have to use nicotine patches.

## 2020-07-06 ENCOUNTER — Telehealth: Payer: Self-pay | Admitting: Physician Assistant

## 2020-07-06 ENCOUNTER — Other Ambulatory Visit: Payer: Self-pay | Admitting: Physician Assistant

## 2020-07-06 DIAGNOSIS — E785 Hyperlipidemia, unspecified: Secondary | ICD-10-CM

## 2020-07-06 NOTE — Telephone Encounter (Signed)
Patient states she changed doctor offices'.

## 2020-07-06 NOTE — Telephone Encounter (Signed)
Please contact patient to schedule an appointment per last AVS for med refills. AS, CMA

## 2020-07-06 NOTE — Telephone Encounter (Signed)
Removed [facility] as PCP and called pharmacy to cancel refill sent to pharmacy. AS< CMA

## 2020-07-17 ENCOUNTER — Other Ambulatory Visit: Payer: Self-pay | Admitting: Physician Assistant

## 2020-07-17 DIAGNOSIS — E785 Hyperlipidemia, unspecified: Secondary | ICD-10-CM

## 2020-11-15 ENCOUNTER — Other Ambulatory Visit: Payer: Self-pay | Admitting: Primary Care

## 2021-02-03 ENCOUNTER — Telehealth: Payer: Self-pay

## 2021-02-03 ENCOUNTER — Other Ambulatory Visit: Payer: Self-pay | Admitting: Surgery

## 2021-02-03 DIAGNOSIS — M4326 Fusion of spine, lumbar region: Secondary | ICD-10-CM

## 2021-02-03 NOTE — Progress Notes (Signed)
Phone call to patient to review instructions for 13 hr prep for CT Myelogram w/ contrast on 02/05/99. Prescription called into [facility]. Attempted to reach patient multiple times and tried calling spouses phone number. However, I was unsuccessful in reaching the patient but left a detailed message regarding the prescription being called in and that she would need to pick it up and take these medications prior to coming to her appointment or we would not be able to do her myelogram procedure.  Prescription: 02/03/21 8:30 PM- 50mg  Prednisone 02/04/21 2:30 AM- 50mg  Prednisone 02/04/21 8:30 AM- 50mg  Prednisone and 50mg  Benadryl

## 2021-02-04 ENCOUNTER — Ambulatory Visit
Admission: RE | Admit: 2021-02-04 | Discharge: 2021-02-04 | Disposition: A | Discharge status: Home / Self Care | Payer: Worker's Compensation | Source: Ambulatory Visit | Attending: Surgery | Admitting: Surgery

## 2021-02-04 ENCOUNTER — Other Ambulatory Visit: Payer: Self-pay

## 2021-02-04 ENCOUNTER — Other Ambulatory Visit: Payer: Self-pay | Admitting: Surgery

## 2021-02-04 DIAGNOSIS — M4326 Fusion of spine, lumbar region: Secondary | ICD-10-CM

## 2021-02-04 NOTE — Discharge Instructions (Signed)

## 2021-02-07 ENCOUNTER — Ambulatory Visit
Admission: RE | Admit: 2021-02-07 | Discharge: 2021-02-07 | Disposition: A | Discharge status: Home / Self Care | Payer: Worker's Compensation | Source: Ambulatory Visit | Attending: Surgery | Admitting: Surgery

## 2021-02-07 ENCOUNTER — Other Ambulatory Visit: Payer: Self-pay

## 2021-02-07 VITALS — BP 117/68 | HR 76

## 2021-02-07 DIAGNOSIS — Z981 Arthrodesis status: Secondary | ICD-10-CM

## 2021-02-07 DIAGNOSIS — M4326 Fusion of spine, lumbar region: Secondary | ICD-10-CM

## 2021-02-07 DIAGNOSIS — M5416 Radiculopathy, lumbar region: Secondary | ICD-10-CM

## 2021-02-07 MED ORDER — DIAZEPAM 5 MG PO TABS
5.0000 mg | ORAL_TABLET | Freq: Once | ORAL | Status: AC
  Administered 2021-02-07: 5 mg via ORAL

## 2021-02-07 MED ORDER — IOPAMIDOL (ISOVUE-M 200) INJECTION 41%
20.0000 mL | Freq: Once | INTRAMUSCULAR | Status: AC
  Administered 2021-02-07: 20 mL via INTRATHECAL

## 2021-02-07 NOTE — Discharge Instructions (Signed)

## 2021-08-24 ENCOUNTER — Other Ambulatory Visit: Payer: Self-pay | Admitting: Family Medicine

## 2021-08-24 DIAGNOSIS — E2839 Other primary ovarian failure: Secondary | ICD-10-CM

## 2022-03-15 ENCOUNTER — Encounter: Payer: Self-pay | Admitting: *Deleted

## 2022-12-22 DIAGNOSIS — S2231XA Fracture of one rib, right side, initial encounter for closed fracture: Secondary | ICD-10-CM | POA: Diagnosis not present

## 2022-12-22 DIAGNOSIS — J449 Chronic obstructive pulmonary disease, unspecified: Secondary | ICD-10-CM | POA: Diagnosis not present

## 2023-02-23 DIAGNOSIS — E039 Hypothyroidism, unspecified: Secondary | ICD-10-CM | POA: Diagnosis not present

## 2023-02-23 DIAGNOSIS — E785 Hyperlipidemia, unspecified: Secondary | ICD-10-CM | POA: Diagnosis not present

## 2023-02-23 DIAGNOSIS — Z79899 Other long term (current) drug therapy: Secondary | ICD-10-CM | POA: Diagnosis not present

## 2023-03-01 DIAGNOSIS — E039 Hypothyroidism, unspecified: Secondary | ICD-10-CM | POA: Diagnosis not present

## 2023-03-01 DIAGNOSIS — E785 Hyperlipidemia, unspecified: Secondary | ICD-10-CM | POA: Diagnosis not present

## 2023-03-01 DIAGNOSIS — G43909 Migraine, unspecified, not intractable, without status migrainosus: Secondary | ICD-10-CM | POA: Diagnosis not present

## 2023-03-01 DIAGNOSIS — K219 Gastro-esophageal reflux disease without esophagitis: Secondary | ICD-10-CM | POA: Diagnosis not present

## 2023-03-01 DIAGNOSIS — Z23 Encounter for immunization: Secondary | ICD-10-CM | POA: Diagnosis not present

## 2023-05-23 DIAGNOSIS — F17210 Nicotine dependence, cigarettes, uncomplicated: Secondary | ICD-10-CM | POA: Diagnosis not present

## 2023-05-23 DIAGNOSIS — J449 Chronic obstructive pulmonary disease, unspecified: Secondary | ICD-10-CM | POA: Diagnosis not present

## 2023-07-09 DIAGNOSIS — F17210 Nicotine dependence, cigarettes, uncomplicated: Secondary | ICD-10-CM | POA: Diagnosis not present

## 2023-10-02 DIAGNOSIS — E559 Vitamin D deficiency, unspecified: Secondary | ICD-10-CM | POA: Diagnosis not present

## 2023-10-02 DIAGNOSIS — E785 Hyperlipidemia, unspecified: Secondary | ICD-10-CM | POA: Diagnosis not present

## 2023-10-02 DIAGNOSIS — E039 Hypothyroidism, unspecified: Secondary | ICD-10-CM | POA: Diagnosis not present

## 2023-10-02 DIAGNOSIS — Z79899 Other long term (current) drug therapy: Secondary | ICD-10-CM | POA: Diagnosis not present

## 2023-10-08 DIAGNOSIS — F410 Panic disorder [episodic paroxysmal anxiety] without agoraphobia: Secondary | ICD-10-CM | POA: Diagnosis not present

## 2023-10-08 DIAGNOSIS — K219 Gastro-esophageal reflux disease without esophagitis: Secondary | ICD-10-CM | POA: Diagnosis not present

## 2023-10-08 DIAGNOSIS — E785 Hyperlipidemia, unspecified: Secondary | ICD-10-CM | POA: Diagnosis not present

## 2023-10-08 DIAGNOSIS — E559 Vitamin D deficiency, unspecified: Secondary | ICD-10-CM | POA: Diagnosis not present

## 2023-10-08 DIAGNOSIS — G43909 Migraine, unspecified, not intractable, without status migrainosus: Secondary | ICD-10-CM | POA: Diagnosis not present

## 2023-10-08 DIAGNOSIS — J449 Chronic obstructive pulmonary disease, unspecified: Secondary | ICD-10-CM | POA: Diagnosis not present

## 2023-10-08 DIAGNOSIS — E039 Hypothyroidism, unspecified: Secondary | ICD-10-CM | POA: Diagnosis not present

## 2023-10-08 DIAGNOSIS — F32A Depression, unspecified: Secondary | ICD-10-CM | POA: Diagnosis not present

## 2023-10-24 DIAGNOSIS — J449 Chronic obstructive pulmonary disease, unspecified: Secondary | ICD-10-CM | POA: Diagnosis not present

## 2023-10-24 DIAGNOSIS — F17210 Nicotine dependence, cigarettes, uncomplicated: Secondary | ICD-10-CM | POA: Diagnosis not present

## 2023-11-05 DIAGNOSIS — J449 Chronic obstructive pulmonary disease, unspecified: Secondary | ICD-10-CM | POA: Diagnosis not present

## 2023-11-05 DIAGNOSIS — R070 Pain in throat: Secondary | ICD-10-CM | POA: Diagnosis not present

## 2023-11-05 DIAGNOSIS — R490 Dysphonia: Secondary | ICD-10-CM | POA: Diagnosis not present

## 2023-11-05 DIAGNOSIS — F17200 Nicotine dependence, unspecified, uncomplicated: Secondary | ICD-10-CM | POA: Diagnosis not present

## 2023-11-05 DIAGNOSIS — Z7951 Long term (current) use of inhaled steroids: Secondary | ICD-10-CM | POA: Diagnosis not present

## 2023-11-05 DIAGNOSIS — J45909 Unspecified asthma, uncomplicated: Secondary | ICD-10-CM | POA: Diagnosis not present

## 2023-11-05 DIAGNOSIS — K219 Gastro-esophageal reflux disease without esophagitis: Secondary | ICD-10-CM | POA: Diagnosis not present

## 2023-11-26 DIAGNOSIS — M25511 Pain in right shoulder: Secondary | ICD-10-CM | POA: Diagnosis not present

## 2023-11-26 DIAGNOSIS — S4991XA Unspecified injury of right shoulder and upper arm, initial encounter: Secondary | ICD-10-CM | POA: Diagnosis not present

## 2024-04-15 ENCOUNTER — Ambulatory Visit: Payer: Self-pay
